# Patient Record
Sex: Female | Born: 1935 | Race: White | Hispanic: No | Marital: Single | Smoking: Never smoker
Health system: Southern US, Community
[De-identification: ages and names within clinical notes are randomized; demographics above are authoritative.]

## PROBLEM LIST (undated history)

## (undated) DIAGNOSIS — I10 Essential (primary) hypertension: Secondary | ICD-10-CM

---

## 1997-07-30 ENCOUNTER — Other Ambulatory Visit: Admission: RE | Admit: 1997-07-30 | Discharge: 1997-07-30 | Payer: Self-pay | Admitting: Obstetrics and Gynecology

## 1998-09-09 ENCOUNTER — Other Ambulatory Visit: Admission: RE | Admit: 1998-09-09 | Discharge: 1998-09-09 | Payer: Self-pay | Admitting: Obstetrics and Gynecology

## 1999-09-11 ENCOUNTER — Other Ambulatory Visit: Admission: RE | Admit: 1999-09-11 | Discharge: 1999-09-11 | Payer: Self-pay | Admitting: Obstetrics and Gynecology

## 2000-09-10 ENCOUNTER — Other Ambulatory Visit: Admission: RE | Admit: 2000-09-10 | Discharge: 2000-09-10 | Payer: Self-pay | Admitting: Obstetrics and Gynecology

## 2001-07-04 ENCOUNTER — Ambulatory Visit (HOSPITAL_COMMUNITY): Admission: RE | Admit: 2001-07-04 | Discharge: 2001-07-04 | Payer: Self-pay | Admitting: Oncology

## 2001-07-04 ENCOUNTER — Encounter (HOSPITAL_COMMUNITY): Payer: Self-pay | Admitting: Oncology

## 2001-09-15 ENCOUNTER — Other Ambulatory Visit: Admission: RE | Admit: 2001-09-15 | Discharge: 2001-09-15 | Payer: Self-pay | Admitting: Obstetrics and Gynecology

## 2003-10-04 ENCOUNTER — Other Ambulatory Visit: Admission: RE | Admit: 2003-10-04 | Discharge: 2003-10-04 | Payer: Self-pay | Admitting: Obstetrics and Gynecology

## 2003-11-09 ENCOUNTER — Encounter: Admission: RE | Admit: 2003-11-09 | Discharge: 2003-12-13 | Payer: Self-pay | Admitting: Internal Medicine

## 2004-07-21 ENCOUNTER — Ambulatory Visit: Payer: Self-pay | Admitting: Oncology

## 2004-07-24 ENCOUNTER — Ambulatory Visit (HOSPITAL_COMMUNITY): Admission: RE | Admit: 2004-07-24 | Discharge: 2004-07-24 | Payer: Self-pay | Admitting: Oncology

## 2005-07-20 ENCOUNTER — Ambulatory Visit: Payer: Self-pay | Admitting: Oncology

## 2005-07-21 LAB — CBC WITH DIFFERENTIAL/PLATELET
BASO%: 0.6 % (ref 0.0–2.0)
Basophils Absolute: 0 10*3/uL (ref 0.0–0.1)
EOS%: 3.7 % (ref 0.0–7.0)
Eosinophils Absolute: 0.2 10*3/uL (ref 0.0–0.5)
HCT: 37.1 % (ref 34.8–46.6)
HGB: 12.7 g/dL (ref 11.6–15.9)
LYMPH%: 26 % (ref 14.0–48.0)
MCH: 31.2 pg (ref 26.0–34.0)
MCHC: 34.2 g/dL (ref 32.0–36.0)
MCV: 91.3 fL (ref 81.0–101.0)
MONO#: 1 10*3/uL — ABNORMAL HIGH (ref 0.1–0.9)
MONO%: 15.1 % — ABNORMAL HIGH (ref 0.0–13.0)
NEUT#: 3.4 10*3/uL (ref 1.5–6.5)
NEUT%: 54.6 % (ref 39.6–76.8)
Platelets: 368 10*3/uL (ref 145–400)
RBC: 4.06 10*6/uL (ref 3.70–5.32)
RDW: 12.2 % (ref 11.3–14.5)
WBC: 6.3 10*3/uL (ref 3.9–10.0)
lymph#: 1.6 10*3/uL (ref 0.9–3.3)

## 2005-07-21 LAB — COMPREHENSIVE METABOLIC PANEL
ALT: 17 U/L (ref 0–40)
AST: 22 U/L (ref 0–37)
Albumin: 4 g/dL (ref 3.5–5.2)
Alkaline Phosphatase: 50 U/L (ref 39–117)
BUN: 26 mg/dL — ABNORMAL HIGH (ref 6–23)
CO2: 26 mEq/L (ref 19–32)
Calcium: 9.7 mg/dL (ref 8.4–10.5)
Chloride: 101 mEq/L (ref 96–112)
Creatinine, Ser: 0.9 mg/dL (ref 0.4–1.2)
Glucose, Bld: 91 mg/dL (ref 70–99)
Potassium: 4.9 mEq/L (ref 3.5–5.3)
Sodium: 136 mEq/L (ref 135–145)
Total Bilirubin: 0.4 mg/dL (ref 0.3–1.2)
Total Protein: 6.8 g/dL (ref 6.0–8.3)

## 2005-07-21 LAB — LACTATE DEHYDROGENASE: LDH: 139 U/L (ref 94–250)

## 2006-07-16 ENCOUNTER — Ambulatory Visit: Payer: Self-pay | Admitting: Oncology

## 2006-07-20 LAB — CBC WITH DIFFERENTIAL/PLATELET
BASO%: 0.5 % (ref 0.0–2.0)
Basophils Absolute: 0 10*3/uL (ref 0.0–0.1)
EOS%: 4.5 % (ref 0.0–7.0)
Eosinophils Absolute: 0.3 10*3/uL (ref 0.0–0.5)
HCT: 38.4 % (ref 34.8–46.6)
HGB: 13.6 g/dL (ref 11.6–15.9)
LYMPH%: 24.1 % (ref 14.0–48.0)
MCH: 32.8 pg (ref 26.0–34.0)
MCHC: 35.5 g/dL (ref 32.0–36.0)
MCV: 92.3 fL (ref 81.0–101.0)
MONO#: 0.7 10*3/uL (ref 0.1–0.9)
MONO%: 11.1 % (ref 0.0–13.0)
NEUT#: 4 10*3/uL (ref 1.5–6.5)
NEUT%: 59.8 % (ref 39.6–76.8)
Platelets: 306 10*3/uL (ref 145–400)
RBC: 4.16 10*6/uL (ref 3.70–5.32)
RDW: 12.7 % (ref 11.3–14.5)
WBC: 6.7 10*3/uL (ref 3.9–10.0)
lymph#: 1.6 10*3/uL (ref 0.9–3.3)

## 2006-07-20 LAB — COMPREHENSIVE METABOLIC PANEL
ALT: 14 U/L (ref 0–35)
BUN: 19 mg/dL (ref 6–23)
CO2: 29 mEq/L (ref 19–32)
Creatinine, Ser: 0.87 mg/dL (ref 0.40–1.20)
Total Bilirubin: 0.5 mg/dL (ref 0.3–1.2)

## 2006-07-20 LAB — LACTATE DEHYDROGENASE: LDH: 142 U/L (ref 94–250)

## 2015-01-25 DIAGNOSIS — E119 Type 2 diabetes mellitus without complications: Secondary | ICD-10-CM | POA: Diagnosis not present

## 2015-01-25 DIAGNOSIS — N390 Urinary tract infection, site not specified: Secondary | ICD-10-CM | POA: Diagnosis not present

## 2015-01-25 DIAGNOSIS — I10 Essential (primary) hypertension: Secondary | ICD-10-CM | POA: Diagnosis not present

## 2015-01-25 DIAGNOSIS — E782 Mixed hyperlipidemia: Secondary | ICD-10-CM | POA: Diagnosis not present

## 2015-02-01 DIAGNOSIS — E119 Type 2 diabetes mellitus without complications: Secondary | ICD-10-CM | POA: Diagnosis not present

## 2015-02-01 DIAGNOSIS — M810 Age-related osteoporosis without current pathological fracture: Secondary | ICD-10-CM | POA: Diagnosis not present

## 2015-02-01 DIAGNOSIS — I10 Essential (primary) hypertension: Secondary | ICD-10-CM | POA: Diagnosis not present

## 2015-02-01 DIAGNOSIS — Z23 Encounter for immunization: Secondary | ICD-10-CM | POA: Diagnosis not present

## 2015-02-01 DIAGNOSIS — Z0000 Encounter for general adult medical examination without abnormal findings: Secondary | ICD-10-CM | POA: Diagnosis not present

## 2015-02-01 DIAGNOSIS — E782 Mixed hyperlipidemia: Secondary | ICD-10-CM | POA: Diagnosis not present

## 2015-08-30 DIAGNOSIS — I10 Essential (primary) hypertension: Secondary | ICD-10-CM | POA: Diagnosis not present

## 2015-08-30 DIAGNOSIS — E119 Type 2 diabetes mellitus without complications: Secondary | ICD-10-CM | POA: Diagnosis not present

## 2015-08-30 DIAGNOSIS — E782 Mixed hyperlipidemia: Secondary | ICD-10-CM | POA: Diagnosis not present

## 2015-09-13 DIAGNOSIS — E119 Type 2 diabetes mellitus without complications: Secondary | ICD-10-CM | POA: Diagnosis not present

## 2015-09-13 DIAGNOSIS — Z23 Encounter for immunization: Secondary | ICD-10-CM | POA: Diagnosis not present

## 2015-09-13 DIAGNOSIS — I10 Essential (primary) hypertension: Secondary | ICD-10-CM | POA: Diagnosis not present

## 2015-09-13 DIAGNOSIS — E782 Mixed hyperlipidemia: Secondary | ICD-10-CM | POA: Diagnosis not present

## 2016-02-12 DIAGNOSIS — Z23 Encounter for immunization: Secondary | ICD-10-CM | POA: Diagnosis not present

## 2016-09-04 DIAGNOSIS — I10 Essential (primary) hypertension: Secondary | ICD-10-CM | POA: Diagnosis not present

## 2016-09-04 DIAGNOSIS — E119 Type 2 diabetes mellitus without complications: Secondary | ICD-10-CM | POA: Diagnosis not present

## 2016-09-04 DIAGNOSIS — E782 Mixed hyperlipidemia: Secondary | ICD-10-CM | POA: Diagnosis not present

## 2016-09-11 DIAGNOSIS — Z0000 Encounter for general adult medical examination without abnormal findings: Secondary | ICD-10-CM | POA: Diagnosis not present

## 2016-09-11 DIAGNOSIS — Z23 Encounter for immunization: Secondary | ICD-10-CM | POA: Diagnosis not present

## 2016-09-11 DIAGNOSIS — I129 Hypertensive chronic kidney disease with stage 1 through stage 4 chronic kidney disease, or unspecified chronic kidney disease: Secondary | ICD-10-CM | POA: Diagnosis not present

## 2016-09-11 DIAGNOSIS — E119 Type 2 diabetes mellitus without complications: Secondary | ICD-10-CM | POA: Diagnosis not present

## 2016-09-11 DIAGNOSIS — N183 Chronic kidney disease, stage 3 (moderate): Secondary | ICD-10-CM | POA: Diagnosis not present

## 2016-12-29 DIAGNOSIS — R69 Illness, unspecified: Secondary | ICD-10-CM | POA: Diagnosis not present

## 2017-11-01 DIAGNOSIS — E119 Type 2 diabetes mellitus without complications: Secondary | ICD-10-CM | POA: Diagnosis not present

## 2017-11-01 DIAGNOSIS — N183 Chronic kidney disease, stage 3 (moderate): Secondary | ICD-10-CM | POA: Diagnosis not present

## 2017-11-01 DIAGNOSIS — N390 Urinary tract infection, site not specified: Secondary | ICD-10-CM | POA: Diagnosis not present

## 2017-11-08 DIAGNOSIS — I10 Essential (primary) hypertension: Secondary | ICD-10-CM | POA: Diagnosis not present

## 2017-11-08 DIAGNOSIS — Z0000 Encounter for general adult medical examination without abnormal findings: Secondary | ICD-10-CM | POA: Diagnosis not present

## 2017-11-08 DIAGNOSIS — M810 Age-related osteoporosis without current pathological fracture: Secondary | ICD-10-CM | POA: Diagnosis not present

## 2017-11-08 DIAGNOSIS — N183 Chronic kidney disease, stage 3 (moderate): Secondary | ICD-10-CM | POA: Diagnosis not present

## 2017-11-08 DIAGNOSIS — I129 Hypertensive chronic kidney disease with stage 1 through stage 4 chronic kidney disease, or unspecified chronic kidney disease: Secondary | ICD-10-CM | POA: Diagnosis not present

## 2017-11-08 DIAGNOSIS — E782 Mixed hyperlipidemia: Secondary | ICD-10-CM | POA: Diagnosis not present

## 2017-11-08 DIAGNOSIS — E119 Type 2 diabetes mellitus without complications: Secondary | ICD-10-CM | POA: Diagnosis not present

## 2017-11-08 DIAGNOSIS — E6609 Other obesity due to excess calories: Secondary | ICD-10-CM | POA: Diagnosis not present

## 2018-01-15 DIAGNOSIS — R69 Illness, unspecified: Secondary | ICD-10-CM | POA: Diagnosis not present

## 2018-11-18 DIAGNOSIS — E119 Type 2 diabetes mellitus without complications: Secondary | ICD-10-CM | POA: Diagnosis not present

## 2018-11-18 DIAGNOSIS — E782 Mixed hyperlipidemia: Secondary | ICD-10-CM | POA: Diagnosis not present

## 2018-11-18 DIAGNOSIS — I10 Essential (primary) hypertension: Secondary | ICD-10-CM | POA: Diagnosis not present

## 2018-11-25 DIAGNOSIS — E6609 Other obesity due to excess calories: Secondary | ICD-10-CM | POA: Diagnosis not present

## 2018-11-25 DIAGNOSIS — I129 Hypertensive chronic kidney disease with stage 1 through stage 4 chronic kidney disease, or unspecified chronic kidney disease: Secondary | ICD-10-CM | POA: Diagnosis not present

## 2018-11-25 DIAGNOSIS — E119 Type 2 diabetes mellitus without complications: Secondary | ICD-10-CM | POA: Diagnosis not present

## 2018-11-25 DIAGNOSIS — E782 Mixed hyperlipidemia: Secondary | ICD-10-CM | POA: Diagnosis not present

## 2018-11-25 DIAGNOSIS — I10 Essential (primary) hypertension: Secondary | ICD-10-CM | POA: Diagnosis not present

## 2018-11-25 DIAGNOSIS — M720 Palmar fascial fibromatosis [Dupuytren]: Secondary | ICD-10-CM | POA: Diagnosis not present

## 2018-11-25 DIAGNOSIS — M810 Age-related osteoporosis without current pathological fracture: Secondary | ICD-10-CM | POA: Diagnosis not present

## 2018-11-25 DIAGNOSIS — Z0000 Encounter for general adult medical examination without abnormal findings: Secondary | ICD-10-CM | POA: Diagnosis not present

## 2018-11-25 DIAGNOSIS — Z7189 Other specified counseling: Secondary | ICD-10-CM | POA: Diagnosis not present

## 2018-11-25 DIAGNOSIS — N183 Chronic kidney disease, stage 3 (moderate): Secondary | ICD-10-CM | POA: Diagnosis not present

## 2018-12-09 DIAGNOSIS — E119 Type 2 diabetes mellitus without complications: Secondary | ICD-10-CM | POA: Diagnosis not present

## 2018-12-09 DIAGNOSIS — I739 Peripheral vascular disease, unspecified: Secondary | ICD-10-CM | POA: Diagnosis not present

## 2018-12-09 DIAGNOSIS — L602 Onychogryphosis: Secondary | ICD-10-CM | POA: Diagnosis not present

## 2018-12-09 DIAGNOSIS — R2689 Other abnormalities of gait and mobility: Secondary | ICD-10-CM | POA: Diagnosis not present

## 2018-12-09 DIAGNOSIS — M6281 Muscle weakness (generalized): Secondary | ICD-10-CM | POA: Diagnosis not present

## 2018-12-15 DIAGNOSIS — M720 Palmar fascial fibromatosis [Dupuytren]: Secondary | ICD-10-CM | POA: Diagnosis not present

## 2019-02-06 ENCOUNTER — Encounter

## 2019-02-07 ENCOUNTER — Ambulatory Visit: Payer: Self-pay | Admitting: Podiatry

## 2019-07-13 DIAGNOSIS — L602 Onychogryphosis: Secondary | ICD-10-CM | POA: Diagnosis not present

## 2019-07-13 DIAGNOSIS — I739 Peripheral vascular disease, unspecified: Secondary | ICD-10-CM | POA: Diagnosis not present

## 2019-11-16 DIAGNOSIS — E119 Type 2 diabetes mellitus without complications: Secondary | ICD-10-CM | POA: Diagnosis not present

## 2019-11-16 DIAGNOSIS — L602 Onychogryphosis: Secondary | ICD-10-CM | POA: Diagnosis not present

## 2019-11-16 DIAGNOSIS — L84 Corns and callosities: Secondary | ICD-10-CM | POA: Diagnosis not present

## 2019-11-24 DIAGNOSIS — I10 Essential (primary) hypertension: Secondary | ICD-10-CM | POA: Diagnosis not present

## 2019-11-24 DIAGNOSIS — E119 Type 2 diabetes mellitus without complications: Secondary | ICD-10-CM | POA: Diagnosis not present

## 2019-11-24 DIAGNOSIS — Z0000 Encounter for general adult medical examination without abnormal findings: Secondary | ICD-10-CM | POA: Diagnosis not present

## 2019-11-29 DIAGNOSIS — N390 Urinary tract infection, site not specified: Secondary | ICD-10-CM | POA: Diagnosis not present

## 2019-11-29 DIAGNOSIS — E1165 Type 2 diabetes mellitus with hyperglycemia: Secondary | ICD-10-CM | POA: Diagnosis not present

## 2019-12-29 DIAGNOSIS — E119 Type 2 diabetes mellitus without complications: Secondary | ICD-10-CM | POA: Diagnosis not present

## 2019-12-29 DIAGNOSIS — M810 Age-related osteoporosis without current pathological fracture: Secondary | ICD-10-CM | POA: Diagnosis not present

## 2019-12-29 DIAGNOSIS — E1122 Type 2 diabetes mellitus with diabetic chronic kidney disease: Secondary | ICD-10-CM | POA: Diagnosis not present

## 2019-12-29 DIAGNOSIS — E6609 Other obesity due to excess calories: Secondary | ICD-10-CM | POA: Diagnosis not present

## 2019-12-29 DIAGNOSIS — E782 Mixed hyperlipidemia: Secondary | ICD-10-CM | POA: Diagnosis not present

## 2019-12-29 DIAGNOSIS — Z0000 Encounter for general adult medical examination without abnormal findings: Secondary | ICD-10-CM | POA: Diagnosis not present

## 2019-12-29 DIAGNOSIS — N1831 Chronic kidney disease, stage 3a: Secondary | ICD-10-CM | POA: Diagnosis not present

## 2019-12-29 DIAGNOSIS — N1832 Chronic kidney disease, stage 3b: Secondary | ICD-10-CM | POA: Diagnosis not present

## 2019-12-29 DIAGNOSIS — I129 Hypertensive chronic kidney disease with stage 1 through stage 4 chronic kidney disease, or unspecified chronic kidney disease: Secondary | ICD-10-CM | POA: Diagnosis not present

## 2019-12-29 DIAGNOSIS — I10 Essential (primary) hypertension: Secondary | ICD-10-CM | POA: Diagnosis not present

## 2020-02-13 DIAGNOSIS — L603 Nail dystrophy: Secondary | ICD-10-CM | POA: Diagnosis not present

## 2020-02-13 DIAGNOSIS — L84 Corns and callosities: Secondary | ICD-10-CM | POA: Diagnosis not present

## 2020-02-13 DIAGNOSIS — I739 Peripheral vascular disease, unspecified: Secondary | ICD-10-CM | POA: Diagnosis not present

## 2020-02-13 DIAGNOSIS — B351 Tinea unguium: Secondary | ICD-10-CM | POA: Diagnosis not present

## 2020-08-07 DIAGNOSIS — I739 Peripheral vascular disease, unspecified: Secondary | ICD-10-CM | POA: Diagnosis not present

## 2020-08-07 DIAGNOSIS — E119 Type 2 diabetes mellitus without complications: Secondary | ICD-10-CM | POA: Diagnosis not present

## 2020-08-07 DIAGNOSIS — L602 Onychogryphosis: Secondary | ICD-10-CM | POA: Diagnosis not present

## 2020-11-19 ENCOUNTER — Emergency Department (HOSPITAL_COMMUNITY): Payer: Medicare HMO

## 2020-11-19 ENCOUNTER — Emergency Department (HOSPITAL_COMMUNITY): Payer: Medicare HMO | Admitting: Certified Registered Nurse Anesthetist

## 2020-11-19 ENCOUNTER — Encounter (HOSPITAL_COMMUNITY): Payer: Self-pay

## 2020-11-19 ENCOUNTER — Encounter (HOSPITAL_COMMUNITY)
Admission: EM | Disposition: A | Discharge status: Rehabilitation Facility | Payer: Self-pay | Source: Home / Self Care | Attending: Neurology

## 2020-11-19 ENCOUNTER — Inpatient Hospital Stay (HOSPITAL_COMMUNITY): Payer: Medicare HMO

## 2020-11-19 ENCOUNTER — Inpatient Hospital Stay (HOSPITAL_COMMUNITY)
Admission: EM | Admit: 2020-11-19 | Discharge: 2020-11-25 | DRG: 024 | Disposition: A | Discharge status: Rehabilitation Facility | Payer: Medicare HMO | Attending: Neurology | Admitting: Neurology

## 2020-11-19 DIAGNOSIS — N183 Chronic kidney disease, stage 3 unspecified: Secondary | ICD-10-CM | POA: Diagnosis present

## 2020-11-19 DIAGNOSIS — I63512 Cerebral infarction due to unspecified occlusion or stenosis of left middle cerebral artery: Secondary | ICD-10-CM | POA: Diagnosis not present

## 2020-11-19 DIAGNOSIS — Z9889 Other specified postprocedural states: Secondary | ICD-10-CM | POA: Diagnosis not present

## 2020-11-19 DIAGNOSIS — Z9010 Acquired absence of unspecified breast and nipple: Secondary | ICD-10-CM | POA: Diagnosis not present

## 2020-11-19 DIAGNOSIS — Z853 Personal history of malignant neoplasm of breast: Secondary | ICD-10-CM

## 2020-11-19 DIAGNOSIS — I700 Atherosclerosis of aorta: Secondary | ICD-10-CM | POA: Diagnosis not present

## 2020-11-19 DIAGNOSIS — I639 Cerebral infarction, unspecified: Secondary | ICD-10-CM | POA: Diagnosis not present

## 2020-11-19 DIAGNOSIS — N179 Acute kidney failure, unspecified: Secondary | ICD-10-CM | POA: Diagnosis present

## 2020-11-19 DIAGNOSIS — Z79890 Hormone replacement therapy (postmenopausal): Secondary | ICD-10-CM

## 2020-11-19 DIAGNOSIS — E1122 Type 2 diabetes mellitus with diabetic chronic kidney disease: Secondary | ICD-10-CM | POA: Diagnosis present

## 2020-11-19 DIAGNOSIS — E871 Hypo-osmolality and hyponatremia: Secondary | ICD-10-CM | POA: Diagnosis not present

## 2020-11-19 DIAGNOSIS — R32 Unspecified urinary incontinence: Secondary | ICD-10-CM | POA: Diagnosis present

## 2020-11-19 DIAGNOSIS — I4510 Unspecified right bundle-branch block: Secondary | ICD-10-CM | POA: Diagnosis present

## 2020-11-19 DIAGNOSIS — Z79899 Other long term (current) drug therapy: Secondary | ICD-10-CM | POA: Diagnosis not present

## 2020-11-19 DIAGNOSIS — M419 Scoliosis, unspecified: Secondary | ICD-10-CM | POA: Diagnosis not present

## 2020-11-19 DIAGNOSIS — I6523 Occlusion and stenosis of bilateral carotid arteries: Secondary | ICD-10-CM | POA: Diagnosis not present

## 2020-11-19 DIAGNOSIS — G319 Degenerative disease of nervous system, unspecified: Secondary | ICD-10-CM | POA: Diagnosis not present

## 2020-11-19 DIAGNOSIS — I63412 Cerebral infarction due to embolism of left middle cerebral artery: Secondary | ICD-10-CM | POA: Diagnosis not present

## 2020-11-19 DIAGNOSIS — I129 Hypertensive chronic kidney disease with stage 1 through stage 4 chronic kidney disease, or unspecified chronic kidney disease: Secondary | ICD-10-CM | POA: Diagnosis present

## 2020-11-19 DIAGNOSIS — E876 Hypokalemia: Secondary | ICD-10-CM | POA: Diagnosis present

## 2020-11-19 DIAGNOSIS — R471 Dysarthria and anarthria: Secondary | ICD-10-CM | POA: Diagnosis present

## 2020-11-19 DIAGNOSIS — Z20822 Contact with and (suspected) exposure to covid-19: Secondary | ICD-10-CM | POA: Diagnosis present

## 2020-11-19 DIAGNOSIS — R29706 NIHSS score 6: Secondary | ICD-10-CM | POA: Diagnosis present

## 2020-11-19 DIAGNOSIS — E785 Hyperlipidemia, unspecified: Secondary | ICD-10-CM | POA: Diagnosis present

## 2020-11-19 DIAGNOSIS — I10 Essential (primary) hypertension: Secondary | ICD-10-CM | POA: Diagnosis not present

## 2020-11-19 DIAGNOSIS — H9190 Unspecified hearing loss, unspecified ear: Secondary | ICD-10-CM | POA: Diagnosis present

## 2020-11-19 DIAGNOSIS — T17908A Unspecified foreign body in respiratory tract, part unspecified causing other injury, initial encounter: Secondary | ICD-10-CM

## 2020-11-19 DIAGNOSIS — D72829 Elevated white blood cell count, unspecified: Secondary | ICD-10-CM | POA: Diagnosis not present

## 2020-11-19 DIAGNOSIS — E1151 Type 2 diabetes mellitus with diabetic peripheral angiopathy without gangrene: Secondary | ICD-10-CM | POA: Diagnosis present

## 2020-11-19 DIAGNOSIS — E7800 Pure hypercholesterolemia, unspecified: Secondary | ICD-10-CM | POA: Diagnosis not present

## 2020-11-19 DIAGNOSIS — G9389 Other specified disorders of brain: Secondary | ICD-10-CM | POA: Diagnosis not present

## 2020-11-19 DIAGNOSIS — Z8249 Family history of ischemic heart disease and other diseases of the circulatory system: Secondary | ICD-10-CM

## 2020-11-19 DIAGNOSIS — Z833 Family history of diabetes mellitus: Secondary | ICD-10-CM | POA: Diagnosis not present

## 2020-11-19 DIAGNOSIS — R404 Transient alteration of awareness: Secondary | ICD-10-CM | POA: Diagnosis not present

## 2020-11-19 DIAGNOSIS — R4701 Aphasia: Secondary | ICD-10-CM | POA: Diagnosis not present

## 2020-11-19 DIAGNOSIS — L24A0 Irritant contact dermatitis due to friction or contact with body fluids, unspecified: Secondary | ICD-10-CM | POA: Diagnosis present

## 2020-11-19 DIAGNOSIS — D631 Anemia in chronic kidney disease: Secondary | ICD-10-CM | POA: Diagnosis present

## 2020-11-19 DIAGNOSIS — Z743 Need for continuous supervision: Secondary | ICD-10-CM | POA: Diagnosis not present

## 2020-11-19 DIAGNOSIS — L8990 Pressure ulcer of unspecified site, unspecified stage: Secondary | ICD-10-CM | POA: Insufficient documentation

## 2020-11-19 DIAGNOSIS — E039 Hypothyroidism, unspecified: Secondary | ICD-10-CM | POA: Diagnosis not present

## 2020-11-19 DIAGNOSIS — I70202 Unspecified atherosclerosis of native arteries of extremities, left leg: Secondary | ICD-10-CM | POA: Diagnosis present

## 2020-11-19 DIAGNOSIS — Z23 Encounter for immunization: Secondary | ICD-10-CM | POA: Diagnosis present

## 2020-11-19 HISTORY — PX: IR CT HEAD LTD: IMG2386

## 2020-11-19 HISTORY — DX: Essential (primary) hypertension: I10

## 2020-11-19 HISTORY — PX: IR PERCUTANEOUS ART THROMBECTOMY/INFUSION INTRACRANIAL INC DIAG ANGIO: IMG6087

## 2020-11-19 HISTORY — PX: IR US GUIDE VASC ACCESS LEFT: IMG2389

## 2020-11-19 HISTORY — PX: IR INTRA CRAN STENT: IMG2345

## 2020-11-19 HISTORY — PX: RADIOLOGY WITH ANESTHESIA: SHX6223

## 2020-11-19 LAB — CBC
HCT: 36.7 % (ref 36.0–46.0)
Hemoglobin: 12.5 g/dL (ref 12.0–15.0)
MCH: 32.3 pg (ref 26.0–34.0)
MCHC: 34.1 g/dL (ref 30.0–36.0)
MCV: 94.8 fL (ref 80.0–100.0)
Platelets: 227 10*3/uL (ref 150–400)
RBC: 3.87 MIL/uL (ref 3.87–5.11)
RDW: 12.6 % (ref 11.5–15.5)
WBC: 6.9 10*3/uL (ref 4.0–10.5)
nRBC: 0 % (ref 0.0–0.2)

## 2020-11-19 LAB — URINALYSIS, ROUTINE W REFLEX MICROSCOPIC
Bilirubin Urine: NEGATIVE
Glucose, UA: NEGATIVE mg/dL
Ketones, ur: NEGATIVE mg/dL
Leukocytes,Ua: NEGATIVE
Nitrite: NEGATIVE
Protein, ur: NEGATIVE mg/dL
Specific Gravity, Urine: <1.005 — ABNORMAL LOW (ref 1.005–1.030)
pH: 5.5 (ref 5.0–8.0)

## 2020-11-19 LAB — RAPID URINE DRUG SCREEN, HOSP PERFORMED
Amphetamines: NOT DETECTED
Barbiturates: NOT DETECTED
Benzodiazepines: NOT DETECTED
Cocaine: NOT DETECTED
Opiates: NOT DETECTED
Tetrahydrocannabinol: NOT DETECTED

## 2020-11-19 LAB — COMPREHENSIVE METABOLIC PANEL
ALT: 13 U/L (ref 0–44)
AST: 20 U/L (ref 15–41)
Albumin: 3.4 g/dL — ABNORMAL LOW (ref 3.5–5.0)
Alkaline Phosphatase: 41 U/L (ref 38–126)
Anion gap: 8 (ref 5–15)
BUN: 20 mg/dL (ref 8–23)
CO2: 28 mmol/L (ref 22–32)
Calcium: 9.4 mg/dL (ref 8.9–10.3)
Chloride: 99 mmol/L (ref 98–111)
Creatinine, Ser: 1.13 mg/dL — ABNORMAL HIGH (ref 0.44–1.00)
GFR, Estimated: 47 mL/min — ABNORMAL LOW (ref >60)
Glucose, Bld: 105 mg/dL — ABNORMAL HIGH (ref 70–99)
Potassium: 3.6 mmol/L (ref 3.5–5.1)
Sodium: 135 mmol/L (ref 135–145)
Total Bilirubin: 0.7 mg/dL (ref 0.3–1.2)
Total Protein: 6.6 g/dL (ref 6.5–8.1)

## 2020-11-19 LAB — GLUCOSE, CAPILLARY
Glucose-Capillary: 81 mg/dL (ref 70–99)
Glucose-Capillary: 87 mg/dL (ref 70–99)

## 2020-11-19 LAB — I-STAT CHEM 8, ED
BUN: 21 mg/dL (ref 8–23)
Calcium, Ion: 1.2 mmol/L (ref 1.15–1.40)
Chloride: 99 mmol/L (ref 98–111)
Creatinine, Ser: 1.1 mg/dL — ABNORMAL HIGH (ref 0.44–1.00)
Glucose, Bld: 102 mg/dL — ABNORMAL HIGH (ref 70–99)
HCT: 36 % (ref 36.0–46.0)
Hemoglobin: 12.2 g/dL (ref 12.0–15.0)
Potassium: 3.6 mmol/L (ref 3.5–5.1)
Sodium: 137 mmol/L (ref 135–145)
TCO2: 29 mmol/L (ref 22–32)

## 2020-11-19 LAB — DIFFERENTIAL
Abs Immature Granulocytes: 0.03 10*3/uL (ref 0.00–0.07)
Basophils Absolute: 0.1 10*3/uL (ref 0.0–0.1)
Basophils Relative: 1 %
Eosinophils Absolute: 0.2 10*3/uL (ref 0.0–0.5)
Eosinophils Relative: 3 %
Immature Granulocytes: 0 %
Lymphocytes Relative: 14 %
Lymphs Abs: 1 10*3/uL (ref 0.7–4.0)
Monocytes Absolute: 0.5 10*3/uL (ref 0.1–1.0)
Monocytes Relative: 7 %
Neutro Abs: 5.2 10*3/uL (ref 1.7–7.7)
Neutrophils Relative %: 75 %

## 2020-11-19 LAB — RESP PANEL BY RT-PCR (FLU A&B, COVID) ARPGX2
Influenza A by PCR: NEGATIVE
Influenza B by PCR: NEGATIVE
SARS Coronavirus 2 by RT PCR: NEGATIVE

## 2020-11-19 LAB — URINALYSIS, MICROSCOPIC (REFLEX): Squamous Epithelial / HPF: NONE SEEN (ref 0–5)

## 2020-11-19 LAB — MRSA NEXT GEN BY PCR, NASAL: MRSA by PCR Next Gen: NOT DETECTED

## 2020-11-19 LAB — PROTIME-INR
INR: 1.1 (ref 0.8–1.2)
Prothrombin Time: 14.7 seconds (ref 11.4–15.2)

## 2020-11-19 LAB — ETHANOL: Alcohol, Ethyl (B): <10 mg/dL (ref <10)

## 2020-11-19 LAB — APTT: aPTT: 27 seconds (ref 24–36)

## 2020-11-19 SURGERY — IR WITH ANESTHESIA
Anesthesia: General

## 2020-11-19 MED ORDER — FENTANYL CITRATE (PF) 250 MCG/5ML IJ SOLN
INTRAMUSCULAR | Status: DC | PRN
  Administered 2020-11-19: 100 ug via INTRAVENOUS

## 2020-11-19 MED ORDER — ACETAMINOPHEN 325 MG PO TABS
650.0000 mg | ORAL_TABLET | ORAL | Status: DC | PRN
  Administered 2020-11-22: 650 mg via ORAL
  Filled 2020-11-19 (×2): qty 2

## 2020-11-19 MED ORDER — SODIUM CHLORIDE 0.9 % IV SOLN
INTRAVENOUS | Status: DC | PRN
Start: 2020-11-19 — End: 2020-11-19

## 2020-11-19 MED ORDER — SODIUM CHLORIDE 0.9 % IV SOLN: INTRAVENOUS | Status: DC

## 2020-11-19 MED ORDER — ROCURONIUM BROMIDE 10 MG/ML (PF) SYRINGE
PREFILLED_SYRINGE | INTRAVENOUS | Status: DC | PRN
  Administered 2020-11-19: 50 mg via INTRAVENOUS

## 2020-11-19 MED ORDER — INSULIN ASPART 100 UNIT/ML IJ SOLN: 0.0000 [IU] | INTRAMUSCULAR | Status: DC

## 2020-11-19 MED ORDER — ACETAMINOPHEN 650 MG RE SUPP: 650.0000 mg | RECTAL | Status: DC | PRN

## 2020-11-19 MED ORDER — SUCCINYLCHOLINE CHLORIDE 200 MG/10ML IV SOSY
PREFILLED_SYRINGE | INTRAVENOUS | Status: DC | PRN
  Administered 2020-11-19: 100 mg via INTRAVENOUS

## 2020-11-19 MED ORDER — FENTANYL CITRATE (PF) 250 MCG/5ML IJ SOLN
INTRAMUSCULAR | Status: AC
  Filled 2020-11-19: qty 5

## 2020-11-19 MED ORDER — ONDANSETRON HCL 4 MG/2ML IJ SOLN
INTRAMUSCULAR | Status: DC | PRN
  Administered 2020-11-19: 4 mg via INTRAVENOUS

## 2020-11-19 MED ORDER — ASPIRIN 81 MG PO CHEW
81.0000 mg | CHEWABLE_TABLET | Freq: Every day | ORAL | Status: DC
  Administered 2020-11-19 – 2020-11-25 (×7): 81 mg via ORAL
  Filled 2020-11-19 (×7): qty 1

## 2020-11-19 MED ORDER — STROKE: EARLY STAGES OF RECOVERY BOOK
Freq: Once | Status: AC
  Administered 2020-11-19: 1
  Filled 2020-11-19: qty 1

## 2020-11-19 MED ORDER — TICAGRELOR 90 MG PO TABS
180.0000 mg | ORAL_TABLET | Freq: Once | ORAL | Status: AC
  Administered 2020-11-19: 180 mg via ORAL
  Filled 2020-11-19: qty 2

## 2020-11-19 MED ORDER — PHENYLEPHRINE 40 MCG/ML (10ML) SYRINGE FOR IV PUSH (FOR BLOOD PRESSURE SUPPORT)
PREFILLED_SYRINGE | INTRAVENOUS | Status: DC | PRN
Start: 2020-11-19 — End: 2020-11-19
  Administered 2020-11-19: 80 ug via INTRAVENOUS
  Administered 2020-11-19: 120 ug via INTRAVENOUS

## 2020-11-19 MED ORDER — CANGRELOR TETRASODIUM 50 MG IV SOLR
INTRAVENOUS | Status: AC
  Filled 2020-11-19: qty 50

## 2020-11-19 MED ORDER — SODIUM CHLORIDE 0.9 % IV SOLN: INTRAVENOUS | Status: DC | PRN

## 2020-11-19 MED ORDER — ACETAMINOPHEN 160 MG/5ML PO SOLN: 650.0000 mg | ORAL | Status: DC | PRN

## 2020-11-19 MED ORDER — TICAGRELOR 90 MG PO TABS
180.0000 mg | ORAL_TABLET | Freq: Once | ORAL | Status: AC
  Filled 2020-11-19: qty 2

## 2020-11-19 MED ORDER — SUGAMMADEX SODIUM 200 MG/2ML IV SOLN
INTRAVENOUS | Status: DC | PRN
  Administered 2020-11-19: 200 mg via INTRAVENOUS

## 2020-11-19 MED ORDER — TICAGRELOR 90 MG PO TABS
90.0000 mg | ORAL_TABLET | Freq: Two times a day (BID) | ORAL | Status: DC
  Filled 2020-11-19: qty 1

## 2020-11-19 MED ORDER — SENNOSIDES-DOCUSATE SODIUM 8.6-50 MG PO TABS
1.0000 | ORAL_TABLET | Freq: Every evening | ORAL | Status: DC | PRN
Start: 2020-11-19 — End: 2020-11-25

## 2020-11-19 MED ORDER — LEVOTHYROXINE SODIUM 50 MCG PO TABS
50.0000 ug | ORAL_TABLET | Freq: Every day | ORAL | Status: DC
  Administered 2020-11-20 – 2020-11-25 (×6): 50 ug via ORAL
  Filled 2020-11-19 (×7): qty 1

## 2020-11-19 MED ORDER — TICAGRELOR 90 MG PO TABS
90.0000 mg | ORAL_TABLET | Freq: Two times a day (BID) | ORAL | Status: DC
  Administered 2020-11-20 – 2020-11-25 (×11): 90 mg via ORAL
  Filled 2020-11-19 (×11): qty 1

## 2020-11-19 MED ORDER — IOHEXOL 240 MG/ML SOLN
200.0000 mL | Freq: Once | INTRAMUSCULAR | Status: AC | PRN
  Administered 2020-11-19: 130 mL via INTRA_ARTERIAL

## 2020-11-19 MED ORDER — IOHEXOL 350 MG/ML SOLN
100.0000 mL | Freq: Once | INTRAVENOUS | Status: AC | PRN
  Administered 2020-11-19: 100 mL via INTRAVENOUS

## 2020-11-19 MED ORDER — PHENYLEPHRINE HCL-NACL 20-0.9 MG/250ML-% IV SOLN: INTRAVENOUS | Status: DC | PRN

## 2020-11-19 MED ORDER — CANGRELOR BOLUS VIA INFUSION
INTRAVENOUS | Status: AC | PRN
  Administered 2020-11-19: 1125 ug via INTRAVENOUS

## 2020-11-19 MED ORDER — CLEVIDIPINE BUTYRATE 0.5 MG/ML IV EMUL: 0.0000 mg/h | INTRAVENOUS | Status: DC

## 2020-11-19 MED ORDER — SODIUM CHLORIDE 0.9 % IV SOLN
INTRAVENOUS | Status: AC | PRN
  Administered 2020-11-19: 2 ug/kg/min via INTRAVENOUS

## 2020-11-19 MED ORDER — PHENYLEPHRINE HCL-NACL 20-0.9 MG/250ML-% IV SOLN
0.0000 ug/min | INTRAVENOUS | Status: DC
Start: 2020-11-19 — End: 2020-11-20

## 2020-11-19 MED ORDER — LIDOCAINE 2% (20 MG/ML) 5 ML SYRINGE
INTRAMUSCULAR | Status: DC | PRN
  Administered 2020-11-19: 60 mg via INTRAVENOUS

## 2020-11-19 MED ORDER — ASPIRIN 81 MG PO CHEW
81.0000 mg | CHEWABLE_TABLET | Freq: Every day | ORAL | Status: DC
  Filled 2020-11-19: qty 1

## 2020-11-19 MED ORDER — PHENYLEPHRINE HCL-NACL 20-0.9 MG/250ML-% IV SOLN
INTRAVENOUS | Status: DC | PRN
  Administered 2020-11-19: 35 ug/min via INTRAVENOUS

## 2020-11-19 MED ORDER — EPHEDRINE SULFATE-NACL 50-0.9 MG/10ML-% IV SOSY
PREFILLED_SYRINGE | INTRAVENOUS | Status: DC | PRN
  Administered 2020-11-19: 10 mg via INTRAVENOUS

## 2020-11-19 MED ORDER — PROPOFOL 10 MG/ML IV BOLUS
INTRAVENOUS | Status: DC | PRN
  Administered 2020-11-19: 70 mg via INTRAVENOUS

## 2020-11-19 NOTE — ED Notes (Signed)
Patient transported to CT 

## 2020-11-19 NOTE — ED Notes (Signed)
MD notified of pts symptoms.   

## 2020-11-19 NOTE — Transfer of Care (Signed)
Immediate Anesthesia Transfer of Care Note  Patient: Maria Dyer  Procedure(s) Performed: IR WITH ANESTHESIA  Patient Location: PACU  Anesthesia Type:General  Level of Consciousness: drowsy and responds to stimulation  Airway & Oxygen Therapy: Patient Spontanous Breathing  Post-op Assessment: Report given to RN and Post -op Vital signs reviewed and stable  Post vital signs: Reviewed and stable  Last Vitals:  Vitals Value Taken Time  BP 126/54 11/19/20 1715  Temp 36.1 C 11/19/20 1715  Pulse 75 11/19/20 1718  Resp 28 11/19/20 1719  SpO2 98 % 11/19/20 1718  Vitals shown include unvalidated device data.  Last Pain:  Vitals:   11/19/20 1326  TempSrc: Oral         Complications: No notable events documented.

## 2020-11-19 NOTE — Plan of Care (Signed)
  Problem: Self-Care: Goal: Ability to participate in self-care as condition permits will improve Outcome: Progressing   Problem: Nutrition: Goal: Risk of aspiration will decrease Outcome: Progressing Goal: Dietary intake will improve Outcome: Progressing   Problem: Ischemic Stroke/TIA Tissue Perfusion: Goal: Complications of ischemic stroke/TIA will be minimized Outcome: Progressing   Problem: Clinical Measurements: Goal: Ability to maintain clinical measurements within normal limits will improve Outcome: Progressing Goal: Will remain free from infection Outcome: Progressing Goal: Diagnostic test results will improve Outcome: Progressing Goal: Respiratory complications will improve Outcome: Progressing Goal: Cardiovascular complication will be avoided Outcome: Progressing   Problem: Activity: Goal: Risk for activity intolerance will decrease Outcome: Progressing   Problem: Nutrition: Goal: Adequate nutrition will be maintained Outcome: Progressing   Problem: Coping: Goal: Level of anxiety will decrease Outcome: Progressing   Problem: Elimination: Goal: Will not experience complications related to bowel motility Outcome: Progressing Goal: Will not experience complications related to urinary retention Outcome: Progressing   Problem: Pain Managment: Goal: General experience of comfort will improve Outcome: Progressing   Problem: Safety: Goal: Ability to remain free from injury will improve Outcome: Progressing   Problem: Education: Goal: Knowledge of disease or condition will improve Outcome: Not Progressing Goal: Knowledge of secondary prevention will improve Outcome: Not Progressing Goal: Knowledge of patient specific risk factors addressed and post discharge goals established will improve Outcome: Not Progressing Goal: Individualized Educational Video(s) Outcome: Not Progressing   Problem: Coping: Goal: Will verbalize positive feelings about  self Outcome: Not Progressing Goal: Will identify appropriate support needs Outcome: Not Progressing   Problem: Health Behavior/Discharge Planning: Goal: Ability to manage health-related needs will improve Outcome: Not Progressing   Problem: Self-Care: Goal: Verbalization of feelings and concerns over difficulty with self-care will improve Outcome: Not Progressing Goal: Ability to communicate needs accurately will improve Outcome: Not Progressing   Problem: Education: Goal: Knowledge of General Education information will improve Description: Including pain rating scale, medication(s)/side effects and non-pharmacologic comfort measures Outcome: Not Progressing   Problem: Health Behavior/Discharge Planning: Goal: Ability to manage health-related needs will improve Outcome: Not Progressing   Problem: Skin Integrity: Goal: Risk for impaired skin integrity will decrease Outcome: Not Progressing

## 2020-11-19 NOTE — Sedation Documentation (Signed)
Pt with large excoriated area to right groin.

## 2020-11-19 NOTE — Anesthesia Postprocedure Evaluation (Signed)
Anesthesia Post Note  Patient: Maria Dyer  Procedure(s) Performed: IR WITH ANESTHESIA     Patient location during evaluation: PACU Anesthesia Type: General Level of consciousness: confused Pain management: pain level controlled Vital Signs Assessment: post-procedure vital signs reviewed and stable Respiratory status: spontaneous breathing, nonlabored ventilation and respiratory function stable Cardiovascular status: blood pressure returned to baseline and stable Postop Assessment: no apparent nausea or vomiting Anesthetic complications: no   No notable events documented.  Last Vitals:  Vitals:   11/19/20 1730 11/19/20 1745  BP: (!) 127/97   Pulse: 75 74  Resp: 16 13  Temp:    SpO2: 100% 100%    Last Pain:  Vitals:   11/19/20 1715  TempSrc:   PainSc: 0-No pain                 [place] Fassnacht,W. EDMOND

## 2020-11-19 NOTE — Anesthesia Preprocedure Evaluation (Signed)
Anesthesia Evaluation  Patient identified by MRN, date of birth, ID band Patient confused    Reviewed: Allergy & Precautions, H&P , NPO status , Patient's Chart, lab work & pertinent test results  Airway Mallampati: II  TM Distance: >3 FB Neck ROM: Full    Dental no notable dental hx. (+) Teeth Intact, Dental Advisory Given   Pulmonary neg pulmonary ROS,    Pulmonary exam normal breath sounds clear to auscultation       Cardiovascular hypertension,  Rhythm:Regular Rate:Tachycardia     Neuro/Psych CVA, Residual Symptoms negative psych ROS   GI/Hepatic negative GI ROS, Neg liver ROS,   Endo/Other  negative endocrine ROS  Renal/GU negative Renal ROS  negative genitourinary   Musculoskeletal   Abdominal   Peds  Hematology negative hematology ROS (+)   Anesthesia Other Findings   Reproductive/Obstetrics negative OB ROS                             Anesthesia Physical Anesthesia Plan  ASA: 3 and emergent  Anesthesia Plan: General   Post-op Pain Management:    Induction: Intravenous, Rapid sequence and Cricoid pressure planned  PONV Risk Score and Plan: 4 or greater and Ondansetron, Dexamethasone and Treatment may vary due to age or medical condition  Airway Management Planned: Oral ETT  Additional Equipment:   Intra-op Plan:   Post-operative Plan: Extubation in OR  Informed Consent: I have reviewed the patients History and Physical, chart, labs and discussed the procedure including the risks, benefits and alternatives for the proposed anesthesia with the patient or authorized representative who has indicated his/her understanding and acceptance.     Dental advisory given  Plan Discussed with: CRNA  Anesthesia Plan Comments:         Anesthesia Quick Evaluation

## 2020-11-19 NOTE — Code Documentation (Signed)
Pt is 87 yr old female with h/o hypertension and hypothyroidism. She was found to be aphasic today by her great-niece at noon. Family saw her well last on Sunday 8/21. Family also messaged with her 8/22 at 14:18, and she seemed normal then. As pt was thought to be out of the window for acute treatment, she was not pre-activated. When pt had CT head that showed L MCA M1 hyperdensity, code stroke was activated. Pt then had a CTA and perfusion. I joined the team in CT at 1505. Both Dr Tommie Sams and Dr Iver Nestle agreed that pt needed to be taken to IR emergently. Pt was prepped and taken to IR. MDs obtaining written consent from pt's family. MDs aware that LKW was > 24 hr ago, but state that as CT had hyperdense L MCA, and ASPECTS 8, and CTA positive for occlusion, and CTP shows core of 5cc and penumbra of 71mL, the stroke is most likely < 24 hr old. Neurologist and interventionalist agree that potential benefit of NIR outweighs risk in this aphasic patient. Informed consent obtained. Pt handed off to IR Alexia Freestone, RN) at [phone].

## 2020-11-19 NOTE — Procedures (Signed)
INTERVENTIONAL NEURORADIOLOGY BRIEF POSTPROCEDURE NOTE  DIAGNOSTIC CEREBRAL ANGIOGRAM MECHANICAL THROMBECTOMY INTRACRANIAL STENTING FLAT PANEL HEAD CT  Attending: Dr. Baldemar Lenis  Assistant: None.  Diagnosis: Left M2/MCA occlusion  Access site: LEFT common femoral artery  Access closure: Perclose Prostyle  Anesthesia: General anesthesia  Medication used: Cangrelor bolus and drip.  Complications: None.  Estimated blood loss: 30-40 mL  Specimen: None  Findings: Left M2/MCA near occlusion with very delayed contrast penetration. Mechanical thrombectomy performed with direct contact aspiration x2 with improved distal perfusion (TICI 2C). However, there was persistent severe stenosis. Patient was loaded on cangrelor and a 4.5 x 30 mm neuroform atlas stent was deployed spanning the left M1-M2 posterio division with significant improvement of the aterograde flow (TICI3). Delay angiogram showed no evidence of in stent thrombosis. Flat panel head CT showed no evidence of hemorrhagic complication.  The patient tolerated the procedure well without incident or complication and is in stable condition.   PLAN: - Bed rest post left femoral access x 6 hours - Head CT around 7:30 pm. It is important that the patient have the CT done before cangrelor drip ends (8pm). - If no bleed on head CT, stop cangrelor and load ticagrelor 180 mg and ASA 81 mg. Please place NG tube if patient is unable to swallow. - If bleed on head CT, please contact neurology and neuro IR. - SBP 120-140 mmHg (24h post procedure)

## 2020-11-19 NOTE — Anesthesia Procedure Notes (Addendum)
Procedure Name: Intubation Date/Time: 11/19/2020 3:24 PM Performed by: Trinna Post., CRNA Pre-anesthesia Checklist: Patient identified, Emergency Drugs available, Suction available, Patient being monitored and Timeout performed Patient Re-evaluated:Patient Re-evaluated prior to induction Oxygen Delivery Method: Circle system utilized Preoxygenation: Pre-oxygenation with 100% oxygen Induction Type: IV induction, Rapid sequence and Cricoid Pressure applied Laryngoscope Size: Mac and 3 Grade View: Grade I Tube type: Oral Tube size: 7.5 mm Number of attempts: 1 Airway Equipment and Method: Stylet Placement Confirmation: ETT inserted through vocal cords under direct vision, positive ETCO2 and breath sounds checked- equal and bilateral Secured at: 22 cm Tube secured with: Tape Dental Injury: Teeth and Oropharynx as per pre-operative assessment

## 2020-11-19 NOTE — H&P (Addendum)
Neurology H&P  Reason for Consult: Aphasia Referring Physician: Dr. Fredderick Phenix  CC: Aphasia  History is obtained from: Dr. Fredderick Phenix, chart review, patient's great niece   HPI: Maria Dyer is a 87 y.o. female with a medical history significant for essential hypertension, severe hearing impairment, breast cancer s/p mastectomy over 25 years ago per great niece, thoracolumbar scoliosis, and hypothyroidism who presented to the ED via EMS from home for evaluation of garbled and dysarthric speech. History is provided by patient's great niece due to patient with significant aphasia. Her great niece went to her house this morning as she does every Tuesday to take Maria Dyer for lunch. When she arrived at Maria Dyer house at noon, Maria Dyer had not gotten out of the bed or gotten dressed which is extremely unusual for her. When she attempted to speak, she was garbled and slurred with very little coherent speech but her great niece states she kept reiterating that she was reading and "all of a sudden". Her great niece also noted that her bed was wet and believes that she was incontinent of urine which is also abnormal. She was last seen normal by family on Sunday at baseline but Maria Dyer daughter reported that she spoke with Maria Dyer via Facebook messaging yesterday at 2:18PM without noted abnormality and her great niece viewed Maria Dyer sitting in her recliner through home video monitoring yesterday as well.   At baseline, Maria Dyer lives independently and is able to ambulate with a walker with reported "very limited mobility" but she is able to navigate around her home without assistance. She manages her own medication and bills and has family nearby who visit often and help with transportation to restaurants and hair appointments. When she is outside of the house, she sometimes prefers to use a wheelchair. She is extremely hard of hearing and family notes that she may say "yes" or nod  inappropriately because she is unable to hear. Her great niece states that Maria Dyer does not need help with ADLs but indicates that she primarily cares for Maria Dyer out of all of her family.   LKW: Last seen by family on Sunday, 11/17/2020. Last spoke to family without noted abnormality via messaging 11/18/2020 at 14:18 tpa given?: no, outside of thrombolytic therapy window IR Thrombectomy? Yes, hyperdense vessel sign at L MCA distal M1, proximal M2 on CTH with small acute LEFT MCA territory infarction, core volume of 44mL with mismatch of 39 mL on CTP.  Modified Rankin Scale: 1-No significant post stroke disability and can perform usual duties with stroke symptoms  ROS: Unable to obtain due to patient with significant aphasia impairing communication.  However per family no recent illnesses, and no other acute concerns or known transient neurological symptoms.  Past Medical History:  Diagnosis Date   Hypertension   Please have pertinent history as detailed above in HPI  No family history on file. Family reports patient's brother had diabetes and that many members of the family have had hypertension  Social History:   has no history on file for tobacco use, alcohol use, and drug use.  Medications  Current Facility-Administered Medications:    fentaNYL citrate (PF) (SUBLIMAZE) 250 MCG/5ML injection, , , ,  No current outpatient medications on file.  Facility-Administered Medications Ordered in Other Encounters:    0.9 %  sodium chloride infusion, , Intravenous, Continuous PRN, Modena Morrow, CRNA, New Bag at 11/19/20 1510   0.9 %  sodium chloride infusion, , Intravenous, Continuous  PRN, Modena Morrow, CRNA, New Bag at 11/19/20 1510   fentaNYL citrate (PF) (SUBLIMAZE) injection, , Intravenous, Anesthesia Intra-op, Modena Morrow, CRNA, 100 mcg at 11/19/20 1522   lidocaine 2% (20 mg/mL) 5 mL syringe, , Intravenous, Anesthesia Intra-op, Modena Morrow, CRNA, 60 mg  at 11/19/20 1522   propofol (DIPRIVAN) 10 mg/mL bolus/IV push, , Intravenous, Anesthesia Intra-op, Modena Morrow, CRNA, 70 mg at 11/19/20 1522   succinylcholine (ANECTINE) syringe, , Intravenous, Anesthesia Intra-op, Modena Morrow, CRNA, 100 mg at 11/19/20 1522  Current Outpatient Medications  Medication Instructions   hydrochlorothiazide (HYDRODIURIL) 25 mg, Oral, Daily   levothyroxine (SYNTHROID) 50 mcg, Oral, Daily   losartan (COZAAR) 100 mg, Oral, Daily   metoprolol succinate (TOPROL-XL) 50 mg, Oral, 2 times daily    Exam: Current vital signs: BP (!) 175/56   Pulse 73   Temp 98.1 F (36.7 C) (Oral)   Resp (!) 21   SpO2 98%  Vital signs in last 24 hours: Temp:  [98.1 F (36.7 C)] 98.1 F (36.7 C) (08/23 1326) Pulse Rate:  [71-73] 73 (08/23 1400) Resp:  [20-21] 21 (08/23 1400) BP: (154-175)/(56-77) 175/56 (08/23 1400) SpO2:  [98 %] 98 % (08/23 1400)  GENERAL: Awake, alert, in no acute distress Psych: Affect appropriate for situation, patient is calm and cooperative with examination Head: Normocephalic and atraumatic, without obvious abnormality EENT: Normal conjunctivae, dry mucous membranes, no OP obstruction LUNGS: Normal respiratory effort. Non-labored breathing on room air CV: Regular rate and rhythm on telemetry ABDOMEN: Soft, non-tender, non-distended Extremities: warm, well perfused, without obvious deformity  Limited neurological evaluation performed consisting of NIH stroke scale to expedite thrombectomy  NIHSS: 1a Level of Conscious.:  0 --alert, regards examiner 1b LOC Questions: 2 --answers yes inappropriately to all questions 1c LOC Commands: 2 --answers yes inappropriately to all questions 2 Best Gaze: 0 --tracks examiner well in all directions although per IR staff perhaps was developing a left gaze preference by the time of her arrival to the IR suite  3 Visual: 0 Orients to wiggling fingers in all quadrants 4 Facial Palsy: 0  5a Motor Arm  - left: 0  5b Motor Arm - Right: 0   6a Motor Leg - Left: 0  6b Motor Leg - Right: 0 7 Limb Ataxia: 0  8 Sensory: 0  9 Best Language: 2  10 Dysarthria: 0  11 Extinct. and Inatten.: 0 TOTAL: 6  Immediately postprocedure: the patient was extubated and still unable to follow commands, or produce any usable speech other than "yes," possibly with a slight left gaze preference but able to cross towards the right.  Possibly reduced blink to threat on the right compared to the left but unreliable.  Moving all 4 extremities grossly equally (localized with bilateral upper extremities to sternal rub, moved bilateral lower extremities to light tickle briskly)   Labs I have reviewed labs in epic and the results pertinent to this consultation are: Glucose 102, creatinine 1.10  CBC    Component Value Date/Time   WBC 6.9 11/19/2020 1401   RBC 3.87 11/19/2020 1401   HGB 12.2 11/19/2020 1422   HGB 13.6 07/20/2006 1021   HCT 36.0 11/19/2020 1422   HCT 38.4 07/20/2006 1021   PLT 227 11/19/2020 1401   PLT 306 07/20/2006 1021   MCV 94.8 11/19/2020 1401   MCV 92.3 07/20/2006 1021   MCH 32.3 11/19/2020 1401   MCHC 34.1 11/19/2020 1401   RDW 12.6 11/19/2020 1401  RDW 12.7 07/20/2006 1021   LYMPHSABS 1.0 11/19/2020 1401   LYMPHSABS 1.6 07/20/2006 1021   MONOABS 0.5 11/19/2020 1401   MONOABS 0.7 07/20/2006 1021   EOSABS 0.2 11/19/2020 1401   EOSABS 0.3 07/20/2006 1021   BASOSABS 0.1 11/19/2020 1401   BASOSABS 0.0 07/20/2006 1021    CMP     Component Value Date/Time   NA 137 11/19/2020 1422   K 3.6 11/19/2020 1422   CL 99 11/19/2020 1422   CO2 28 11/19/2020 1401   GLUCOSE 102 (H) 11/19/2020 1422   BUN 21 11/19/2020 1422   CREATININE 1.10 (H) 11/19/2020 1422   CALCIUM 9.4 11/19/2020 1401   PROT 6.6 11/19/2020 1401   ALBUMIN 3.4 (L) 11/19/2020 1401   AST 20 11/19/2020 1401   ALT 13 11/19/2020 1401   ALKPHOS 41 11/19/2020 1401   BILITOT 0.7 11/19/2020 1401   GFRNONAA 47 (L) 11/19/2020  1401   Lipid Panel  No results found for: CHOL, TRIG, HDL, CHOLHDL, VLDL, LDLCALC, LDLDIRECT  Imaging I have reviewed the images obtained:  CT head, CT angio head and neck, CT cerebral perfusion 11/19/2020: No acute intracranial hemorrhage. Small acute left MCA territory infarction involving inferior insula and adjacent temporal lobe (ASPECT score is 8).   Early bifurcation of the left MCA with distal M1 and proximal M2 occlusion and subsequent partial reconstitution. High-grade stenosis or nonocclusive thrombus within anterior temporal branch approximately 5 mm beyond its origin just proximal to the occlusion.   Perfusion imaging demonstrates 5 mL of core infarction partially corresponding to noncontrast CT abnormality. There is a calculated penumbra of 39 mL in the left MCA territory.   No occlusion or hemodynamically significant stenosis in the neck.  MRI examination of the brain  Assessment: 87 y.o. female with PMHx of hypertension and severe hearing impairment who presented to the ED via EMS for evaluation of aphasia with last seen normal by family on Sunday.  - Examination reveals  - Stroke risk factors include advanced age and history of hypertension.  - CT imaging revealed a small acute left MCA territory infarction with a hyperdense L MCA sign. Angiography imaging revealed a distal M1, proximal M2 occlusion with subsequent partial reconstitution. CT perfusion imaging reveals a 5 mL core infarction and 39 mL penumbra in the left MCA territory.  - Patient is not a candidate for thrombolytic therapy as she is outside of the time window. Due to CT perfusion imaging revealing a small 71mL core and a significant penumbra of 39 mL with a significant deficit with impaired communication, IR thrombectomy procedure with risks and benefits was discussed with patient's family with neurointerventional radiologist and neurologist at bedside. Family consented to procedure and IR was activated  for further intervention.   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, etiology is likely atheroembolic though cardioembolic also on the differential at this time  Recommendations: #Left MCA stroke s/p IA and stent placement - Stroke labs TSH, HgbA1c, fasting lipid panel - MRI brain  - MRA of the brain without contrast  - Frequent neuro checks - Echocardiogram - Dual antiplatelet therapy for stent per neuro IR, will need NG tube to obtain this critical medication if she cannot pass bedside swallow tonight - Risk factor modification - Telemetry monitoring - Blood pressure goal   - Post successful uncomplicated revascularization SBP 120 - 140 for 24 hours, confirmed with neuro interventionalist; Cleviprex and phenylephrine as needed to maintain this blood pressure goals - Goal normoglycemia with low-dose sliding  scale insulin in place  - PT consult, OT consult, Speech consult, unless patient is back to baseline -Discussed with Dr. Otelia LimesLindzen to follow-up repeat head CT at 7:30 PM and confirm safety of oral ticagrelor at that time -Admitted to the stroke team  #Hypertension -Hold home medications for now, blood pressure goal as above  #Hypothyroidism -Continue home Synthroid, follow-up TSH  Pt seen by NP/Neuro and later by MD. Note/plan to be edited by MD as needed.  Lanae BoastStevi Toberman, AGAC-NP [facility] Pager: [phone]  Attending Neurologist's note:  I personally saw this patient, gathering history, performing the neurologic examination, reviewing relevant labs, personally reviewing relevant imaging including head CT, CTA, CTP, and formulated the assessment and plan, adding the note above for completeness and clarity to accurately reflect my thoughts  Brooke DareSrishti Lulla Linville MD-PhD [facility] [phone]  CRITICAL CARE Performed by: Gordy CouncilmanSrishti L Mustapha Colson   Total critical care time: 40 minutes  Critical care time was exclusive of separately  billable procedures and treating other patients.  Critical care was necessary to treat or prevent imminent or life-threatening deterioration.  Critical care was time spent personally by me on the following activities: development of treatment plan with patient and/or surrogate as well as nursing, discussions with consultants, evaluation of patient's response to treatment, examination of patient, obtaining history from patient or surrogate, ordering and performing treatments and interventions, ordering and review of laboratory studies, ordering and review of radiographic studies, pulse oximetry and re-evaluation of patient's condition.

## 2020-11-19 NOTE — Sedation Documentation (Signed)
Left femoral sheath removed. Per close deployed

## 2020-11-19 NOTE — ED Triage Notes (Signed)
Pt comes from home via EMS. Family called out due to receptive and expressive aphasia at this time. Last known well was Sunday afternoon. Pt lives alone denies hx of dementia or strokes. The same family member that saw pt altered today was last to see her normal on Sunday. No unilaterally weakness but unable to follow commands, pt was able to stand and pivot and has purposeful movements bilaterally.  BP 172/86 HR 85 RR 16 O2 98% RA BGL 106

## 2020-11-19 NOTE — Progress Notes (Signed)
246mcg/5ml fentanyl removed from pyxis for Glade Stanford MD, per request for intubation. Vial handed directly to MD.

## 2020-11-19 NOTE — ED Provider Notes (Signed)
[facility] EMERGENCY DEPARTMENT Provider Note   CSN: [id number] Arrival date & time: 11/19/20  1317     History Chief Complaint  Patient presents with   Stroke Symptoms    Maria Dyer is a 87 y.o. female.  Patient is a 87 year old female who presents with stroke symptoms.  Her grand niece is at bedside.  She was last seen normal by family members 2 days ago.  The grandniece did see her on a home video yesterday sitting on her recliner.  She went to take her out to lunch today and she was in her bed and had urinated in her bed.  She is having trouble communicating.  She does have a severe hearing deficit which makes assessment difficult but per the grandniece, she is not able to communicate well she seems to be trying to talk but her words are not making sense.  The patient is unable to communicate when her symptoms started.  When I asked her if she was like this when she woke up she nods yes.  When I asked her if she was like this when she went to bed last night, she shrugs her shoulders and says she does not know.  I am not sure how much she is hearing what I am communicating.  When we try to write out questions, she will answer some of them but not others.  Per her grandniece, she has a history of hypertension.  She is not on anticoagulants.      Past Medical History:  Diagnosis Date   Hypertension     There are no problems to display for this patient.   The histories are not reviewed yet. Please review them in the "History" navigator section and refresh this SmartLink.   OB History   No obstetric history on file.     No family history on file.     Home Medications Prior to Admission medications   Not on File    Allergies    Patient has no known allergies.  Review of Systems   Review of Systems  Unable to perform ROS: Mental status change   Physical Exam Updated Vital Signs BP (!) 175/56   Pulse 73   Temp 98.1 F (36.7 C) (Oral)    Resp (!) 21   SpO2 98%   Physical Exam Constitutional:      Appearance: She is well-developed.  HENT:     Head: Normocephalic and atraumatic.  Eyes:     Pupils: Pupils are equal, round, and reactive to light.  Cardiovascular:     Rate and Rhythm: Normal rate and regular rhythm.     Heart sounds: Normal heart sounds.  Pulmonary:     Effort: Pulmonary effort is normal. No respiratory distress.     Breath sounds: Normal breath sounds. No wheezing or rales.  Chest:     Chest wall: No tenderness.  Abdominal:     General: Bowel sounds are normal.     Palpations: Abdomen is soft.     Tenderness: There is no abdominal tenderness. There is no guarding or rebound.  Musculoskeletal:        General: Normal range of motion.     Cervical back: Normal range of motion and neck supple.  Lymphadenopathy:     Cervical: No cervical adenopathy.  Skin:    General: Skin is warm and dry.     Findings: No rash.  Neurological:     Mental Status: She is alert.  Comments: Patient has been able to tell me her name but cannot communicate where she is.  She is not following commands well but I do not appreciate any obvious focal deficit.    ED Results / Procedures / Treatments   Labs (all labs ordered are listed, but only abnormal results are displayed) Labs Reviewed  COMPREHENSIVE METABOLIC PANEL - Abnormal; Notable for the following components:      Result Value   Glucose, Bld 105 (*)    Creatinine, Ser 1.13 (*)    Albumin 3.4 (*)    GFR, Estimated 47 (*)    All other components within normal limits  I-STAT CHEM 8, ED - Abnormal; Notable for the following components:   Creatinine, Ser 1.10 (*)    Glucose, Bld 102 (*)    All other components within normal limits  RESP PANEL BY RT-PCR (FLU A&B, COVID) ARPGX2  ETHANOL  PROTIME-INR  APTT  CBC  DIFFERENTIAL  RAPID URINE DRUG SCREEN, HOSP PERFORMED  URINALYSIS, ROUTINE W REFLEX MICROSCOPIC    EKG EKG Interpretation  Date/Time:  Tuesday  November 19 2020 13:31:53 EDT Ventricular Rate:  71 PR Interval:  172 QRS Duration: 121 QT Interval:  453 QTC Calculation: 493 R Axis:   -26 Text Interpretation: Sinus rhythm Right bundle branch block No old tracing to compare Confirmed by Rolan Bucco [phone]) on 11/19/2020 1:59:16 PM Also confirmed by Rolan Bucco [phone]), editor Elita Quick ([id number])  on 11/19/2020 2:25:07 PM  Radiology No results found.  Procedures Procedures   Medications Ordered in ED Medications  iohexol (OMNIPAQUE) 350 MG/ML injection 100 mL (100 mLs Intravenous Contrast Given 11/19/20 1502)    ED Course  I have reviewed the triage vital signs and the nursing notes.  Pertinent labs & imaging results that were available during my care of the patient were reviewed by me and considered in my medical decision making (see chart for details).    MDM Rules/Calculators/A&P                           Patient is a 87 year old female who presents with aphasia.  Her exam is limited due to her severe hearing deficit.  However she does seem to have an aphasia even on writing questions.  However her last known normal on talking to family members was 2 days ago.  She was seen yesterday on a home camera sitting in her recliner but it was unclear at that time whether she had the aphasia or not.  For this reason, code stroke was not activated initially.  She had imaging ordered.  I did message Dr. Iver Nestle regarding her symptoms, but pt was out of window for treatment.  She did meet the patient in CT and noticed a hypodensity on her head CT.  They were able to find out more from family members that she was messaged on Facebook yesterday afternoon and was communicating normally at that time on [employer].  For this reason, IR was activated and patient will be taken to IR.  CRITICAL CARE Performed by: Rolan Bucco Total critical care time: 45 minutes Critical care time was exclusive of separately billable procedures  and treating other patients. Critical care was necessary to treat or prevent imminent or life-threatening deterioration. Critical care was time spent personally by me on the following activities: development of treatment plan with patient and/or surrogate as well as nursing, discussions with consultants, evaluation of patient's response to treatment, examination of  patient, obtaining history from patient or surrogate, ordering and performing treatments and interventions, ordering and review of laboratory studies, ordering and review of radiographic studies, pulse oximetry and re-evaluation of patient's condition.  Final Clinical Impression(s) / ED Diagnoses Final diagnoses:  Aphasia    Rx / DC Orders ED Discharge Orders     None        Rolan Bucco, MD 11/19/20 1513

## 2020-11-20 ENCOUNTER — Inpatient Hospital Stay (HOSPITAL_COMMUNITY): Payer: Medicare HMO

## 2020-11-20 ENCOUNTER — Encounter (HOSPITAL_COMMUNITY): Payer: Self-pay | Admitting: Radiology

## 2020-11-20 DIAGNOSIS — E7800 Pure hypercholesterolemia, unspecified: Secondary | ICD-10-CM

## 2020-11-20 DIAGNOSIS — I639 Cerebral infarction, unspecified: Secondary | ICD-10-CM | POA: Diagnosis not present

## 2020-11-20 DIAGNOSIS — I63512 Cerebral infarction due to unspecified occlusion or stenosis of left middle cerebral artery: Secondary | ICD-10-CM | POA: Diagnosis not present

## 2020-11-20 DIAGNOSIS — R4701 Aphasia: Secondary | ICD-10-CM

## 2020-11-20 LAB — GLUCOSE, CAPILLARY
Glucose-Capillary: 67 mg/dL — ABNORMAL LOW (ref 70–99)
Glucose-Capillary: 92 mg/dL (ref 70–99)
Glucose-Capillary: 78 mg/dL (ref 70–99)
Glucose-Capillary: 106 mg/dL — ABNORMAL HIGH (ref 70–99)
Glucose-Capillary: 87 mg/dL (ref 70–99)
Glucose-Capillary: 174 mg/dL — ABNORMAL HIGH (ref 70–99)

## 2020-11-20 LAB — CBC
HCT: 30.8 % — ABNORMAL LOW (ref 36.0–46.0)
Hemoglobin: 10.5 g/dL — ABNORMAL LOW (ref 12.0–15.0)
MCH: 32.3 pg (ref 26.0–34.0)
MCHC: 34.1 g/dL (ref 30.0–36.0)
MCV: 94.8 fL (ref 80.0–100.0)
Platelets: 174 10*3/uL (ref 150–400)
RBC: 3.25 MIL/uL — ABNORMAL LOW (ref 3.87–5.11)
RDW: 12.8 % (ref 11.5–15.5)
WBC: 5.8 10*3/uL (ref 4.0–10.5)
nRBC: 0 % (ref 0.0–0.2)

## 2020-11-20 LAB — COMPREHENSIVE METABOLIC PANEL
ALT: 12 U/L (ref 0–44)
AST: 22 U/L (ref 15–41)
Albumin: 2.9 g/dL — ABNORMAL LOW (ref 3.5–5.0)
Alkaline Phosphatase: 37 U/L — ABNORMAL LOW (ref 38–126)
Anion gap: 6 (ref 5–15)
BUN: 14 mg/dL (ref 8–23)
CO2: 27 mmol/L (ref 22–32)
Calcium: 8.5 mg/dL — ABNORMAL LOW (ref 8.9–10.3)
Chloride: 104 mmol/L (ref 98–111)
Creatinine, Ser: 1.06 mg/dL — ABNORMAL HIGH (ref 0.44–1.00)
GFR, Estimated: 51 mL/min — ABNORMAL LOW (ref >60)
Glucose, Bld: 105 mg/dL — ABNORMAL HIGH (ref 70–99)
Potassium: 3.4 mmol/L — ABNORMAL LOW (ref 3.5–5.1)
Sodium: 137 mmol/L (ref 135–145)
Total Bilirubin: 0.7 mg/dL (ref 0.3–1.2)
Total Protein: 5.8 g/dL — ABNORMAL LOW (ref 6.5–8.1)

## 2020-11-20 LAB — LIPID PANEL
Cholesterol: 205 mg/dL — ABNORMAL HIGH (ref 0–200)
HDL: 48 mg/dL (ref >40)
LDL Cholesterol: 139 mg/dL — ABNORMAL HIGH (ref 0–99)
Total CHOL/HDL Ratio: 4.3 RATIO
Triglycerides: 91 mg/dL (ref <150)
VLDL: 18 mg/dL (ref 0–40)

## 2020-11-20 LAB — ECHOCARDIOGRAM COMPLETE
Area-P 1/2: 5.54 cm2
S' Lateral: 2.6 cm
Weight: 2250.46 oz

## 2020-11-20 LAB — HEMOGLOBIN A1C
Hgb A1c MFr Bld: 5.9 % — ABNORMAL HIGH (ref 4.8–5.6)
Mean Plasma Glucose: 122.63 mg/dL

## 2020-11-20 LAB — TSH: TSH: 2.512 u[IU]/mL (ref 0.350–4.500)

## 2020-11-20 MED ORDER — POTASSIUM CHLORIDE CRYS ER 20 MEQ PO TBCR
40.0000 meq | EXTENDED_RELEASE_TABLET | ORAL | Status: AC
  Administered 2020-11-20: 40 meq via ORAL
  Filled 2020-11-20: qty 2

## 2020-11-20 MED ORDER — LABETALOL HCL 5 MG/ML IV SOLN
5.0000 mg | INTRAVENOUS | Status: DC | PRN
  Administered 2020-11-21: 10 mg via INTRAVENOUS
  Filled 2020-11-20: qty 4

## 2020-11-20 MED ORDER — INSULIN ASPART 100 UNIT/ML IJ SOLN
0.0000 [IU] | Freq: Three times a day (TID) | INTRAMUSCULAR | Status: DC
  Administered 2020-11-21: 1 [IU] via SUBCUTANEOUS
  Administered 2020-11-22 – 2020-11-23 (×3): 2 [IU] via SUBCUTANEOUS

## 2020-11-20 MED ORDER — CHLORHEXIDINE GLUCONATE CLOTH 2 % EX PADS
6.0000 | MEDICATED_PAD | Freq: Every day | CUTANEOUS | Status: DC
  Administered 2020-11-20 – 2020-11-24 (×5): 6 via TOPICAL

## 2020-11-20 MED ORDER — ATORVASTATIN CALCIUM 40 MG PO TABS
40.0000 mg | ORAL_TABLET | Freq: Every day | ORAL | Status: DC
  Administered 2020-11-20 – 2020-11-25 (×6): 40 mg via ORAL
  Filled 2020-11-20 (×6): qty 1

## 2020-11-20 MED ORDER — ZINC OXIDE 12.8 % EX OINT
TOPICAL_OINTMENT | CUTANEOUS | Status: DC | PRN
  Filled 2020-11-20 (×2): qty 56.7

## 2020-11-20 NOTE — Progress Notes (Signed)
Referring Physician(s): Dr. Iver Nestle  Supervising Physician: Baldemar Lenis  Patient Status:  [facility] - In-pt  Chief Complaint: Code Stroke  Subjective: Patient severely aphasic- receptive and expressive. Does not consistently show the ability to follow commands.  Appears to mimic.  Patient is tearful.  In mittens.   Allergies: Patient has no known allergies.  Medications: Prior to Admission medications   Medication Sig Start Date End Date Taking? Authorizing Provider  hydrochlorothiazide (HYDRODIURIL) 25 MG tablet Take 25 mg by mouth daily. 07/21/20  Yes [provider]  levothyroxine (SYNTHROID) 50 MCG tablet Take 50 mcg by mouth daily. 10/25/20  Yes [provider]  losartan (COZAAR) 100 MG tablet Take 100 mg by mouth daily. 10/06/20  Yes [provider]  metoprolol succinate (TOPROL-XL) 50 MG 24 hr tablet Take 50 mg by mouth 2 (two) times daily. 07/21/20  Yes [provider]     Vital Signs: BP (!) 156/67 Comment: will request PRN blood pressure meds  Pulse (!) 47   Temp 97.6 F (36.4 C) (Oral)   Resp 18   Wt 140 lb 10.5 oz (63.8 kg)   SpO2 (!) 85%   Physical Exam NAD, alert Neuro: alert, agitated/tearful, in mittens. Receptive and expressive aphasia. Appears to mimic, but not consistentyl follow commands. Moving all extremities.  Groin: soft, stable.  Site intact.  No evidence of hematoma or pseudoaneurysm.   Imaging: CT HEAD WO CONTRAST  Result Date: 11/19/2020 CLINICAL DATA:  Stroke, follow up Left M2 occlusion status post M1-M2 stenting. EXAM: CT HEAD WITHOUT CONTRAST TECHNIQUE: Contiguous axial images were obtained from the base of the skull through the vertex without intravenous contrast. COMPARISON:  Same day CT head. FINDINGS: Motion limited study.  Within this limitation: Brain: Similar mild hypoattenuation and possible loss of grey/white differentiation in the inferior left insula with possibly slightly  increased hypoattenuation in the anterior left temporal lobe, although streak artifact limits evaluation. No evidence of acute hemorrhage, hydrocephalus, mass lesion, midline shift, or visible extra-axial collection. Vascular: Status post endovascular stenting of the left MCA. Residual contrast in the vasculature. Skull: No acute fracture. Sinuses/Orbits: Mild paranasal sinus mucosal thickening. No acute renal findings. Other: No mastoid effusions. IMPRESSION: 1. Status post endovascular stenting of the left MCA. Possibly slight increase in edema/hypoattenuation in the anterior left temporal lobe, although streak limits evaluation in this region. If the patient is able, an MRI could better evaluate for acute infarct. 2. No evidence of acute hemorrhage. Electronically Signed   By: Feliberto Harts M.D.   On: 11/19/2020 20:06   MR ANGIO HEAD WO CONTRAST  Result Date: 11/20/2020 CLINICAL DATA:  Follow-up examination for acute stroke, left M2 occlusion, status post thrombectomy and stenting. EXAM: MRI HEAD WITHOUT CONTRAST MRA HEAD WITHOUT CONTRAST TECHNIQUE: Multiplanar, multi-echo pulse sequences of the brain and surrounding structures were acquired without intravenous contrast. Angiographic images of the Circle of Willis were acquired using MRA technique without intravenous contrast. COMPARISON:  Prior CTs from earlier the same day. FINDINGS: MRI HEAD FINDINGS Brain: Generalized age-related cerebral atrophy. Mild chronic microvascular ischemic disease noted involving the periventricular white matter. Remote lacunar infarct present at the posterior right lentiform nucleus/internal capsule. Few small remote bilateral cerebellar infarcts. Patchy restricted diffusion seen involving the left parietal and temporal lobes, consistent with acute left MCA distribution infarct (series 5, images 83, 74). Involvement is primarily cortical in nature, although there are some patchy subcortical involvement. Minimal associated  petechial hemorrhage without frank intraparenchymal hematoma (  series 14, image 33). No significant regional mass effect. No other evidence for acute or subacute ischemia. Gray-white matter differentiation otherwise maintained. No other acute or chronic intracranial hemorrhage elsewhere within the brain. No mass lesion, midline shift or mass effect. No hydrocephalus or extra-axial fluid collection. Pituitary gland suprasellar region normal. Midline structures intact. Vascular: Major intracranial vascular flow voids are maintained. Susceptibility artifact related to stenting noted at the left M1/M2 segments. Skull and upper cervical spine: Craniocervical junction within normal limits. Bone marrow signal intensity normal. No scalp soft tissue abnormality. Sinuses/Orbits: Globes and orbital soft tissues demonstrate no acute finding. Axial myopia noted. Scattered mucosal thickening noted throughout the paranasal sinuses. No air-fluid levels to suggest acute sinusitis. Moderate right with small left mastoid effusions. Visualized nasopharynx unremarkable. Other: 7 mm T2 hyperintense lesion seen within the right parotid gland (series 17, image 17), indeterminate. MRA HEAD FINDINGS Anterior circulation: Examination degraded by my a shin artifact. Distal cervical ICAs remain widely patent antegrade flow. Petrous segments widely patent. Atheromatous irregularity throughout the carotid siphons without high-grade stenosis. A1 segments, anterior communicating artery complex common anterior cerebral arteries remain patent without definite stenosis. Right M1 segment and distal right MCA branches well perfused and widely patent. Attenuated but patent flow within the proximal left M1 segment. Susceptibility artifact related to a vascular stent spanning the left M1-M2 segments. Flow through the stent itself is not assessed by MRA. Markedly attenuated flow seen beyond the stent within the distal left MCA branches. Posterior  circulation: Both vertebral arteries remain patent to the vertebrobasilar junction. Both PICA remain patent. Basilar diminutive but patent to its distal aspect without stenosis. Superior cerebral arteries patent bilaterally. Fetal type origin of both PCAs, both of which remain patent to their distal aspects. Anatomic variants: Fetal type origin of the PCAs with overall diminutive vertebrobasilar system. No intracranial aneurysm. IMPRESSION: MRI HEAD IMPRESSION: 1. Multifocal acute ischemic left MCA distribution infarcts involving the left parietal and temporal lobes. Minimal associated petechial hemorrhage without frank intraparenchymal hematoma or mass effect. 2. No other acute intracranial abnormality. 3. Underlying age-related cerebral atrophy with mild chronic small vessel ischemic disease, with remote lacunar infarcts involving the right lentiform nucleus/internal capsule, and bilateral cerebellar hemispheres. MRA HEAD IMPRESSION: 1. Technically limited exam due to motion artifact. 2. Interval placement of a vascular stent spanning the left M1-M2 segments. Flow within the stent itself is not assessed by MRA, obscured by susceptibility artifact. Markedly attenuated and reduced flow seen beyond the stent within the left MCA branches distally, raising the possibility for intra stent stenosis or other pathology. Finding could be further assessed with dedicated CTA or repeat arteriogram as warranted. 3. Otherwise stable intracranial MRA, with no other new or progressive finding. 4. Fetal type origin of the PCAs with overall diminutive vertebrobasilar system. Electronically Signed   By: Rise Mu M.D.   On: 11/20/2020 02:32   MR BRAIN WO CONTRAST  Result Date: 11/20/2020 CLINICAL DATA:  Follow-up examination for acute stroke, left M2 occlusion, status post thrombectomy and stenting. EXAM: MRI HEAD WITHOUT CONTRAST MRA HEAD WITHOUT CONTRAST TECHNIQUE: Multiplanar, multi-echo pulse sequences of the brain  and surrounding structures were acquired without intravenous contrast. Angiographic images of the Circle of Willis were acquired using MRA technique without intravenous contrast. COMPARISON:  Prior CTs from earlier the same day. FINDINGS: MRI HEAD FINDINGS Brain: Generalized age-related cerebral atrophy. Mild chronic microvascular ischemic disease noted involving the periventricular white matter. Remote lacunar infarct present at the posterior right lentiform nucleus/internal capsule. Few  small remote bilateral cerebellar infarcts. Patchy restricted diffusion seen involving the left parietal and temporal lobes, consistent with acute left MCA distribution infarct (series 5, images 83, 74). Involvement is primarily cortical in nature, although there are some patchy subcortical involvement. Minimal associated petechial hemorrhage without frank intraparenchymal hematoma (series 14, image 33). No significant regional mass effect. No other evidence for acute or subacute ischemia. Gray-white matter differentiation otherwise maintained. No other acute or chronic intracranial hemorrhage elsewhere within the brain. No mass lesion, midline shift or mass effect. No hydrocephalus or extra-axial fluid collection. Pituitary gland suprasellar region normal. Midline structures intact. Vascular: Major intracranial vascular flow voids are maintained. Susceptibility artifact related to stenting noted at the left M1/M2 segments. Skull and upper cervical spine: Craniocervical junction within normal limits. Bone marrow signal intensity normal. No scalp soft tissue abnormality. Sinuses/Orbits: Globes and orbital soft tissues demonstrate no acute finding. Axial myopia noted. Scattered mucosal thickening noted throughout the paranasal sinuses. No air-fluid levels to suggest acute sinusitis. Moderate right with small left mastoid effusions. Visualized nasopharynx unremarkable. Other: 7 mm T2 hyperintense lesion seen within the right parotid  gland (series 17, image 17), indeterminate. MRA HEAD FINDINGS Anterior circulation: Examination degraded by my a shin artifact. Distal cervical ICAs remain widely patent antegrade flow. Petrous segments widely patent. Atheromatous irregularity throughout the carotid siphons without high-grade stenosis. A1 segments, anterior communicating artery complex common anterior cerebral arteries remain patent without definite stenosis. Right M1 segment and distal right MCA branches well perfused and widely patent. Attenuated but patent flow within the proximal left M1 segment. Susceptibility artifact related to a vascular stent spanning the left M1-M2 segments. Flow through the stent itself is not assessed by MRA. Markedly attenuated flow seen beyond the stent within the distal left MCA branches. Posterior circulation: Both vertebral arteries remain patent to the vertebrobasilar junction. Both PICA remain patent. Basilar diminutive but patent to its distal aspect without stenosis. Superior cerebral arteries patent bilaterally. Fetal type origin of both PCAs, both of which remain patent to their distal aspects. Anatomic variants: Fetal type origin of the PCAs with overall diminutive vertebrobasilar system. No intracranial aneurysm. IMPRESSION: MRI HEAD IMPRESSION: 1. Multifocal acute ischemic left MCA distribution infarcts involving the left parietal and temporal lobes. Minimal associated petechial hemorrhage without frank intraparenchymal hematoma or mass effect. 2. No other acute intracranial abnormality. 3. Underlying age-related cerebral atrophy with mild chronic small vessel ischemic disease, with remote lacunar infarcts involving the right lentiform nucleus/internal capsule, and bilateral cerebellar hemispheres. MRA HEAD IMPRESSION: 1. Technically limited exam due to motion artifact. 2. Interval placement of a vascular stent spanning the left M1-M2 segments. Flow within the stent itself is not assessed by MRA, obscured  by susceptibility artifact. Markedly attenuated and reduced flow seen beyond the stent within the left MCA branches distally, raising the possibility for intra stent stenosis or other pathology. Finding could be further assessed with dedicated CTA or repeat arteriogram as warranted. 3. Otherwise stable intracranial MRA, with no other new or progressive finding. 4. Fetal type origin of the PCAs with overall diminutive vertebrobasilar system. Electronically Signed   By: Rise Mu M.D.   On: 11/20/2020 02:32   CT ANGIO HEAD NECK W WO CM W PERF (CODE STROKE)  Result Date: 11/19/2020 CLINICAL DATA:  Neuro deficit, acute, stroke suspected EXAM: CT ANGIOGRAPHY HEAD AND NECK CT PERFUSION BRAIN TECHNIQUE: Multidetector CT imaging of the head and neck was performed using the standard protocol during bolus administration of intravenous contrast. Multiplanar CT image  reconstructions and MIPs were obtained to evaluate the vascular anatomy. Carotid stenosis measurements (when applicable) are obtained utilizing NASCET criteria, using the distal internal carotid diameter as the denominator. Multiphase CT imaging of the brain was performed following IV bolus contrast injection. Subsequent parametric perfusion maps were calculated using RAPID software. CONTRAST:  OMNIPAQUE IOHEXOL 350 MG/ML SOLN COMPARISON:  None. FINDINGS: CT HEAD Brain: There is no acute intracranial hemorrhage, mass effect, or edema. There is loss of gray-white differentiation along the inferior insula and likely adjacent temporal lobe. There is no extra-axial fluid collection. Prominence of the ventricles and sulci reflects generalized parenchymal volume loss. Patchy low-attenuation in the supratentorial white matter is nonspecific but may reflect chronic microvascular ischemic changes. There is a small chronic left cerebellar infarct. Vascular: Focal hyperdensity is present along the left M1 MCA. Skull: Calvarium is unremarkable.  Sinuses/Orbits: No acute finding. Other: None. Review of the MIP images confirms the above findings CTA NECK Aortic arch: Mild mixed plaque is present along the aortic arch. There is irregular noncalcified plaque along the superior margin beyond the left subclavian origin. Right carotid system: Patent. Mild calcified plaque at the bifurcation. No stenosis. Left carotid system: Patent. Mild calcified plaque at the bifurcation and proximal internal carotid. No stenosis. Vertebral arteries: Patent. Left vertebral artery slightly dominant. No stenosis. Skeleton: Cervical spine degenerative changes. Vertebral body hemangioma at T1. Other neck: Unremarkable. Upper chest: No apical lung mass. Review of the MIP images confirms the above findings CTA HEAD Anterior circulation: Intracranial internal carotid arteries are patent with mild calcified plaque but no significant stenosis. Proximal left M1 MCA is patent. There is early bifurcation of the MCA with distal M1 proximal M2 occlusion. Partial reconstitution. There is high-grade stenosis or nonocclusive thrombus within anterior temporal branch approximately 5 mm beyond its origin arising just proximal to the occlusion. Right middle and both anterior cerebral arteries are patent. Posterior circulation: Intracranial vertebral arteries are patent with mixed plaque causing mild stenosis. Basilar artery is patent. Major cerebellar artery origins are patent. Bilateral posterior communicating arteries are present with fetal origin of both patent posterior cerebral arteries. Venous sinuses: Patent as allowed by contrast bolus timing. Review of the MIP images confirms the above findings CT Brain Perfusion Findings: CBF (<30%) Volume: 5mL Perfusion (Tmax>6.0s) volume: 44mL Mismatch Volume: 39mL Infarction Location: Left MCA territory IMPRESSION: No acute intracranial hemorrhage. Small acute left MCA territory infarction involving inferior insula and adjacent temporal lobe (ASPECT  score is 8). Early bifurcation of the left MCA with distal M1 and proximal M2 occlusion and subsequent partial reconstitution. High-grade stenosis or nonocclusive thrombus within anterior temporal branch approximately 5 mm beyond its origin just proximal to the occlusion. Perfusion imaging demonstrates 5 mL of core infarction partially corresponding to noncontrast CT abnormality. There is a calculated penumbra of 39 mL in the left MCA territory. No occlusion or hemodynamically significant stenosis in the neck. Initial results were communicated to Dr. Iver Nestle at 3:11 pm on 11/19/2020 by text page via the [facility] messaging system. Electronically Signed   By: Guadlupe Spanish M.D.   On: 11/19/2020 15:24    Labs:  CBC: Recent Labs    11/19/20 1401 11/19/20 1422 11/20/20 0446  WBC 6.9  --  5.8  HGB 12.5 12.2 10.5*  HCT 36.7 36.0 30.8*  PLT 227  --  174    COAGS: Recent Labs    11/19/20 1401  INR 1.1  APTT 27    BMP: Recent Labs    11/19/20  1401 11/19/20 1422 11/20/20 0446  NA 135 137 137  K 3.6 3.6 3.4*  CL 99 99 104  CO2 28  --  27  GLUCOSE 105* 102* 105*  BUN 20 21 14   CALCIUM 9.4  --  8.5*  CREATININE 1.13* 1.10* 1.06*  GFRNONAA 47*  --  51*    LIVER FUNCTION TESTS: Recent Labs    11/19/20 1401 11/20/20 0446  BILITOT 0.7 0.7  AST 20 22  ALT 13 12  ALKPHOS 41 37*  PROT 6.6 5.8*  ALBUMIN 3.4* 2.9*    Assessment and Plan: Left M2/MCA occlusion Patient stable this AM, extubated and attempting to converse.  Noted to have severe receptive and expressive aphasia.  Tearful, potentially due to inability to ask or answer questions/communicate.  Moving all extremities.  Groin site stable.  Ok to begin working with PT/OT from [employer] perspective.  Passed bedside swallow eval.  Got aspirin 81mg , Brilinta 90mg  PO this AM.  Electronically Signed: Hoyt KochKacie Sue-Ellen Leronda Lewers, PA 11/20/2020, 10:47 AM   I spent a total of 15 Minutes at the the patient's bedside AND on the patient's  hospital floor or unit, greater than 50% of which was counseling/coordinating care for Left MCA occlusion.

## 2020-11-20 NOTE — Progress Notes (Signed)
Inpatient Rehab Admissions Coordinator:   Per therapy recommendations,  patient was screened for CIR candidacy by Aboubacar Matsuo, MS, CCC-SLP . At this time, Pt. Appears to demonstrate medical necessity, functional decline, and ability to tolerate intensity of CIR. Pt. is a potential candidate for CIR. I will place   order for rehab consult per protocol for full assessment. Please contact me any with questions..  Scottie Stanish, MS, CCC-SLP Rehab Admissions Coordinator  [phone] (celll) [phone] (office)  

## 2020-11-20 NOTE — Progress Notes (Signed)
STROKE TEAM PROGRESS NOTE   INTERVAL HISTORY Her RN and PT/OT are at the bedside.  Pt sitting in bed, global aphasia, able to say "I don't know" "I wait here" but most of time random words and intangible. Not following commands. However, no facial droop, moving all extremities well.   Later I met pt niece and grandson in the room, I had long discussion with them at bedside, updated pt current condition, treatment plan and potential prognosis, and answered all the questions. They expressed understanding and appreciation.    OBJECTIVE Vitals:   11/20/20 0300 11/20/20 0400 11/20/20 0500 11/20/20 0600  BP: (!) 128/55 133/62 (!) 128/97 (!) 132/51  Pulse: 70 76 76 73  Resp: 12 (!) _0 Temp:  98.4 F (36.9 C)    TempSrc:  Oral    SpO2: 98% 90% 96% 97%  Weight:        CBC:  Recent Labs  Lab 11/19/20 1401 11/19/20 1422 11/20/20 0446  WBC 6.9  --  5.8  NEUTROABS 5.2  --   --   HGB 12.5 12.2 10.5*  HCT 36.7 36.0 30.8*  MCV 94.8  --  94.8  PLT 227  --  462    Basic Metabolic Panel:  Recent Labs  Lab 11/19/20 1401 11/19/20 1422 11/20/20 0446  NA 135 137 137  K 3.6 3.6 3.4*  CL 99 99 104  CO2 28  --  27  GLUCOSE 105* 102* 105*  BUN _1 CREATININE 1.13* 1.10* 1.06*  CALCIUM 9.4  --  8.5*    Lipid Panel:     Component Value Date/Time   CHOL 205 (H) 11/20/2020 0446   TRIG 91 11/20/2020 0446   HDL 48 11/20/2020 0446   CHOLHDL 4.3 11/20/2020 0446   VLDL 18 11/20/2020 0446   LDLCALC 139 (H) 11/20/2020 0446   HgbA1c:  Lab Results  Component Value Date   HGBA1C 5.9 (H) 11/20/2020   Urine Drug Screen:     Component Value Date/Time   LABOPIA NONE DETECTED 11/19/2020 1831   COCAINSCRNUR NONE DETECTED 11/19/2020 1831   LABBENZ NONE DETECTED 11/19/2020 1831   AMPHETMU NONE DETECTED 11/19/2020 1831   THCU NONE DETECTED 11/19/2020 1831   LABBARB NONE DETECTED 11/19/2020 1831    Alcohol Level     Component Value Date/Time   ETH <10 11/19/2020 1401     IMAGING   CT HEAD WO CONTRAST  Result Date: 11/19/2020 CLINICAL DATA:  Stroke, follow up Left M2 occlusion status post M1-M2 stenting. EXAM: CT HEAD WITHOUT CONTRAST TECHNIQUE: Contiguous axial images were obtained from the base of the skull through the vertex without intravenous contrast. COMPARISON:  Same day CT head. FINDINGS: Motion limited study.  Within this limitation: Brain: Similar mild hypoattenuation and possible loss of grey/white differentiation in the inferior left insula with possibly slightly increased hypoattenuation in the anterior left temporal lobe, although streak artifact limits evaluation. No evidence of acute hemorrhage, hydrocephalus, mass lesion, midline shift, or visible extra-axial collection. Vascular: Status post endovascular stenting of the left MCA. Residual contrast in the vasculature. Skull: No acute fracture. Sinuses/Orbits: Mild paranasal sinus mucosal thickening. No acute renal findings. Other: No mastoid effusions. IMPRESSION: 1. Status post endovascular stenting of the left MCA. Possibly slight increase in edema/hypoattenuation in the anterior left temporal lobe, although streak limits evaluation in this region. If the patient is able, an MRI could better evaluate for acute infarct. 2. No evidence of acute hemorrhage. Electronically Signed  By: Margaretha Sheffield M.D.   On: 11/19/2020 20:06   MR ANGIO HEAD WO CONTRAST  Result Date: 11/20/2020 CLINICAL DATA:  Follow-up examination for acute stroke, left M2 occlusion, status post thrombectomy and stenting. EXAM: MRI HEAD WITHOUT CONTRAST MRA HEAD WITHOUT CONTRAST TECHNIQUE: Multiplanar, multi-echo pulse sequences of the brain and surrounding structures were acquired without intravenous contrast. Angiographic images of the Circle of Willis were acquired using MRA technique without intravenous contrast. COMPARISON:  Prior CTs from earlier the same day. FINDINGS: MRI HEAD FINDINGS Brain: Generalized age-related  cerebral atrophy. Mild chronic microvascular ischemic disease noted involving the periventricular white matter. Remote lacunar infarct present at the posterior right lentiform nucleus/internal capsule. Few small remote bilateral cerebellar infarcts. Patchy restricted diffusion seen involving the left parietal and temporal lobes, consistent with acute left MCA distribution infarct (series 5, images 83, 74). Involvement is primarily cortical in nature, although there are some patchy subcortical involvement. Minimal associated petechial hemorrhage without frank intraparenchymal hematoma (series 14, image 33). No significant regional mass effect. No other evidence for acute or subacute ischemia. Gray-white matter differentiation otherwise maintained. No other acute or chronic intracranial hemorrhage elsewhere within the brain. No mass lesion, midline shift or mass effect. No hydrocephalus or extra-axial fluid collection. Pituitary gland suprasellar region normal. Midline structures intact. Vascular: Major intracranial vascular flow voids are maintained. Susceptibility artifact related to stenting noted at the left M1/M2 segments. Skull and upper cervical spine: Craniocervical junction within normal limits. Bone marrow signal intensity normal. No scalp soft tissue abnormality. Sinuses/Orbits: Globes and orbital soft tissues demonstrate no acute finding. Axial myopia noted. Scattered mucosal thickening noted throughout the paranasal sinuses. No air-fluid levels to suggest acute sinusitis. Moderate right with small left mastoid effusions. Visualized nasopharynx unremarkable. Other: 7 mm T2 hyperintense lesion seen within the right parotid gland (series 17, image 17), indeterminate. MRA HEAD FINDINGS Anterior circulation: Examination degraded by my a shin artifact. Distal cervical ICAs remain widely patent antegrade flow. Petrous segments widely patent. Atheromatous irregularity throughout the carotid siphons without  high-grade stenosis. A1 segments, anterior communicating artery complex common anterior cerebral arteries remain patent without definite stenosis. Right M1 segment and distal right MCA branches well perfused and widely patent. Attenuated but patent flow within the proximal left M1 segment. Susceptibility artifact related to a vascular stent spanning the left M1-M2 segments. Flow through the stent itself is not assessed by MRA. Markedly attenuated flow seen beyond the stent within the distal left MCA branches. Posterior circulation: Both vertebral arteries remain patent to the vertebrobasilar junction. Both PICA remain patent. Basilar diminutive but patent to its distal aspect without stenosis. Superior cerebral arteries patent bilaterally. Fetal type origin of both PCAs, both of which remain patent to their distal aspects. Anatomic variants: Fetal type origin of the PCAs with overall diminutive vertebrobasilar system. No intracranial aneurysm. IMPRESSION: MRI HEAD IMPRESSION: 1. Multifocal acute ischemic left MCA distribution infarcts involving the left parietal and temporal lobes. Minimal associated petechial hemorrhage without frank intraparenchymal hematoma or mass effect. 2. No other acute intracranial abnormality. 3. Underlying age-related cerebral atrophy with mild chronic small vessel ischemic disease, with remote lacunar infarcts involving the right lentiform nucleus/internal capsule, and bilateral cerebellar hemispheres. MRA HEAD IMPRESSION: 1. Technically limited exam due to motion artifact. 2. Interval placement of a vascular stent spanning the left M1-M2 segments. Flow within the stent itself is not assessed by MRA, obscured by susceptibility artifact. Markedly attenuated and reduced flow seen beyond the stent within the left MCA branches distally, raising  the possibility for intra stent stenosis or other pathology. Finding could be further assessed with dedicated CTA or repeat arteriogram as warranted.  3. Otherwise stable intracranial MRA, with no other new or progressive finding. 4. Fetal type origin of the PCAs with overall diminutive vertebrobasilar system. Electronically Signed   By: Jeannine Boga M.D.   On: 11/20/2020 02:32   MR BRAIN WO CONTRAST  Result Date: 11/20/2020 CLINICAL DATA:  Follow-up examination for acute stroke, left M2 occlusion, status post thrombectomy and stenting. EXAM: MRI HEAD WITHOUT CONTRAST MRA HEAD WITHOUT CONTRAST TECHNIQUE: Multiplanar, multi-echo pulse sequences of the brain and surrounding structures were acquired without intravenous contrast. Angiographic images of the Circle of Willis were acquired using MRA technique without intravenous contrast. COMPARISON:  Prior CTs from earlier the same day. FINDINGS: MRI HEAD FINDINGS Brain: Generalized age-related cerebral atrophy. Mild chronic microvascular ischemic disease noted involving the periventricular white matter. Remote lacunar infarct present at the posterior right lentiform nucleus/internal capsule. Few small remote bilateral cerebellar infarcts. Patchy restricted diffusion seen involving the left parietal and temporal lobes, consistent with acute left MCA distribution infarct (series 5, images 83, 74). Involvement is primarily cortical in nature, although there are some patchy subcortical involvement. Minimal associated petechial hemorrhage without frank intraparenchymal hematoma (series 14, image 33). No significant regional mass effect. No other evidence for acute or subacute ischemia. Gray-white matter differentiation otherwise maintained. No other acute or chronic intracranial hemorrhage elsewhere within the brain. No mass lesion, midline shift or mass effect. No hydrocephalus or extra-axial fluid collection. Pituitary gland suprasellar region normal. Midline structures intact. Vascular: Major intracranial vascular flow voids are maintained. Susceptibility artifact related to stenting noted at the left M1/M2  segments. Skull and upper cervical spine: Craniocervical junction within normal limits. Bone marrow signal intensity normal. No scalp soft tissue abnormality. Sinuses/Orbits: Globes and orbital soft tissues demonstrate no acute finding. Axial myopia noted. Scattered mucosal thickening noted throughout the paranasal sinuses. No air-fluid levels to suggest acute sinusitis. Moderate right with small left mastoid effusions. Visualized nasopharynx unremarkable. Other: 7 mm T2 hyperintense lesion seen within the right parotid gland (series 17, image 17), indeterminate. MRA HEAD FINDINGS Anterior circulation: Examination degraded by my a shin artifact. Distal cervical ICAs remain widely patent antegrade flow. Petrous segments widely patent. Atheromatous irregularity throughout the carotid siphons without high-grade stenosis. A1 segments, anterior communicating artery complex common anterior cerebral arteries remain patent without definite stenosis. Right M1 segment and distal right MCA branches well perfused and widely patent. Attenuated but patent flow within the proximal left M1 segment. Susceptibility artifact related to a vascular stent spanning the left M1-M2 segments. Flow through the stent itself is not assessed by MRA. Markedly attenuated flow seen beyond the stent within the distal left MCA branches. Posterior circulation: Both vertebral arteries remain patent to the vertebrobasilar junction. Both PICA remain patent. Basilar diminutive but patent to its distal aspect without stenosis. Superior cerebral arteries patent bilaterally. Fetal type origin of both PCAs, both of which remain patent to their distal aspects. Anatomic variants: Fetal type origin of the PCAs with overall diminutive vertebrobasilar system. No intracranial aneurysm. IMPRESSION: MRI HEAD IMPRESSION: 1. Multifocal acute ischemic left MCA distribution infarcts involving the left parietal and temporal lobes. Minimal associated petechial hemorrhage  without frank intraparenchymal hematoma or mass effect. 2. No other acute intracranial abnormality. 3. Underlying age-related cerebral atrophy with mild chronic small vessel ischemic disease, with remote lacunar infarcts involving the right lentiform nucleus/internal capsule, and bilateral cerebellar hemispheres. MRA HEAD IMPRESSION:  1. Technically limited exam due to motion artifact. 2. Interval placement of a vascular stent spanning the left M1-M2 segments. Flow within the stent itself is not assessed by MRA, obscured by susceptibility artifact. Markedly attenuated and reduced flow seen beyond the stent within the left MCA branches distally, raising the possibility for intra stent stenosis or other pathology. Finding could be further assessed with dedicated CTA or repeat arteriogram as warranted. 3. Otherwise stable intracranial MRA, with no other new or progressive finding. 4. Fetal type origin of the PCAs with overall diminutive vertebrobasilar system. Electronically Signed   By: Jeannine Boga M.D.   On: 11/20/2020 02:32   CT ANGIO HEAD NECK W WO CM W PERF (CODE STROKE)  Result Date: 11/19/2020 CLINICAL DATA:  Neuro deficit, acute, stroke suspected EXAM: CT ANGIOGRAPHY HEAD AND NECK CT PERFUSION BRAIN TECHNIQUE: Multidetector CT imaging of the head and neck was performed using the standard protocol during bolus administration of intravenous contrast. Multiplanar CT image reconstructions and MIPs were obtained to evaluate the vascular anatomy. Carotid stenosis measurements (when applicable) are obtained utilizing NASCET criteria, using the distal internal carotid diameter as the denominator. Multiphase CT imaging of the brain was performed following IV bolus contrast injection. Subsequent parametric perfusion maps were calculated using RAPID software. CONTRAST:  121m OMNIPAQUE IOHEXOL 350 MG/ML SOLN COMPARISON:  None. FINDINGS: CT HEAD Brain: There is no acute intracranial hemorrhage, mass effect, or  edema. There is loss of gray-white differentiation along the inferior insula and likely adjacent temporal lobe. There is no extra-axial fluid collection. Prominence of the ventricles and sulci reflects generalized parenchymal volume loss. Patchy low-attenuation in the supratentorial white matter is nonspecific but may reflect chronic microvascular ischemic changes. There is a small chronic left cerebellar infarct. Vascular: Focal hyperdensity is present along the left M1 MCA. Skull: Calvarium is unremarkable. Sinuses/Orbits: No acute finding. Other: None. Review of the MIP images confirms the above findings CTA NECK Aortic arch: Mild mixed plaque is present along the aortic arch. There is irregular noncalcified plaque along the superior margin beyond the left subclavian origin. Right carotid system: Patent. Mild calcified plaque at the bifurcation. No stenosis. Left carotid system: Patent. Mild calcified plaque at the bifurcation and proximal internal carotid. No stenosis. Vertebral arteries: Patent. Left vertebral artery slightly dominant. No stenosis. Skeleton: Cervical spine degenerative changes. Vertebral body hemangioma at T1. Other neck: Unremarkable. Upper chest: No apical lung mass. Review of the MIP images confirms the above findings CTA HEAD Anterior circulation: Intracranial internal carotid arteries are patent with mild calcified plaque but no significant stenosis. Proximal left M1 MCA is patent. There is early bifurcation of the MCA with distal M1 proximal M2 occlusion. Partial reconstitution. There is high-grade stenosis or nonocclusive thrombus within anterior temporal branch approximately 5 mm beyond its origin arising just proximal to the occlusion. Right middle and both anterior cerebral arteries are patent. Posterior circulation: Intracranial vertebral arteries are patent with mixed plaque causing mild stenosis. Basilar artery is patent. Major cerebellar artery origins are patent. Bilateral  posterior communicating arteries are present with fetal origin of both patent posterior cerebral arteries. Venous sinuses: Patent as allowed by contrast bolus timing. Review of the MIP images confirms the above findings CT Brain Perfusion Findings: CBF (<30%) Volume: 523mPerfusion (Tmax>6.0s) volume: 4471mismatch Volume: 39m58mfarction Location: Left MCA territory IMPRESSION: No acute intracranial hemorrhage. Small acute left MCA territory infarction involving inferior insula and adjacent temporal lobe (ASPECT score is 8). Early bifurcation of the left MCA with  distal M1 and proximal M2 occlusion and subsequent partial reconstitution. High-grade stenosis or nonocclusive thrombus within anterior temporal branch approximately 5 mm beyond its origin just proximal to the occlusion. Perfusion imaging demonstrates 5 mL of core infarction partially corresponding to noncontrast CT abnormality. There is a calculated penumbra of 39 mL in the left MCA territory. No occlusion or hemodynamically significant stenosis in the neck. Initial results were communicated to Dr. Curly Shores at 3:11 pm on 11/19/2020 by text page via the [facility] messaging system. Electronically Signed   By: Macy Mis M.D.   On: 11/19/2020 15:24     Neuro Interventional Radiology - Cerebral Angiogram with Intervention - de Rosario Jacks, MD 11/19/2020  5:04 PM Left M2/MCA near occlusion with very delayed contrast penetration. Mechanical thrombectomy performed with direct contact aspiration x2 with improved distal perfusion (TICI 2C). However, there was persistent severe stenosis. Patient was loaded on cangrelor and a 4.5 x 30 mm neuroform atlas stent was deployed spanning the left M1-M2 posterio division with significant improvement of the aterograde flow (TICI3). Delay angiogram showed no evidence of in stent thrombosis. Flat panel head CT showed no evidence of hemorrhagic complication. The patient tolerated the procedure well without  incident or complication and is in stable condition.  PLAN: - Bed rest post left femoral access x 6 hours - Head CT around 7:30 pm. It is important that the patient have the CT done before cangrelor drip ends (8pm). - If no bleed on head CT, stop cangrelor and load ticagrelor 180 mg and ASA 81 mg. Please place NG tube if patient is unable to swallow. - If bleed on head CT, please contact neurology and neuro IR. - SBP 120-140 mmHg (24h post procedure)   Transthoracic Echocardiogram  00/00/2021 Pending  ECG - SR rate 71 BPM. (See cardiology reading for complete details)  PHYSICAL EXAM  Temp:  [97 F (36.1 C)-98.4 F (36.9 C)] 97.6 F (36.4 C) (08/24 0700) Pulse Rate:  [47-81] 47 (08/24 0700) Resp:  [0-22] 18 (08/24 1000) BP: (120-175)/(48-99) 156/67 (08/24 1000) SpO2:  [85 %-100 %] 85 % (08/24 0700) Weight:  [63.8 kg] 63.8 kg (08/23 1815)  General - Well nourished, well developed, in no apparent distress.  Ophthalmologic - fundi not visualized due to noncooperation.  Cardiovascular - Regular rhythm but mild tachycardia working with PT/OT.  Neuro - awake, alert, eyes open, global aphasia, not following all simple commands, able to have random words out, intangible but did have intermittent meaningful short sentences but not appropriate with situation. Not able to name or repeat or read. No gaze palsy, tracking bilaterally, blinking to visual threat bilaterally. No facial droop. Tongue protrusion not cooperative. Bilateral UEs at least 4/5. Bilaterally LEs at least 3/5. Sensation, coordination and gait not tested.    ASSESSMENT/PLAN [name] is a 87 y.o. female with history of essential hypertension, severe hearing impairment, breast cancer s/p mastectomy over 25 years ago per great niece, thoracolumbar scoliosis, and hypothyroidism who presented to the ED via EMS from home for evaluation of garbled and dysarthric speech.  She did not receive IV t-PA due to late  presentation (>4.5 hours from time of onset).  Stroke: L MCA patchy infarcts - embolic - source unknown.  CT head - No acute intracranial hemorrhage. Small acute left MCA territory infarction involving inferior insula and adjacent temporal lobe (ASPECT score is 8). CTA H&N - Early bifurcation of the left MCA with distal M1 and proximal M2 occlusion and subsequent partial  reconstitution. High-grade stenosis or nonocclusive thrombus within anterior temporal branch approximately 5 mm beyond its origin just proximal to the occlusion.  CTP - 5/44, positive penumbra. IR - left M2 near occlusion -> TICI2c -> Persistent severe stenosis -> stenting -> TICI3 MRI head - Multifocal acute ischemic left MCA distribution infarcts involving the left parietal and temporal lobes.  MRA head - Interval placement of a vascular stent spanning the left M1-M2 segments. Markedly attenuated and reduced flow seen beyond the stent within the left MCA branches distally, raising the possibility for intra stent stenosis or other pathology. Fetal type origin of the PCAs with overall diminutive vertebrobasilar system. LE venous doppler pending 2D Echo - EF 60-65% Consider loop recorder if embolic work up negative.  Sars Corona Virus 2 - negative LDL - 139 HgbA1c - 5.9 UDS - negative VTE prophylaxis - SCDs No antithrombotic prior to admission, now on aspirin 81 mg daily and Brilinta (ticagrelor) 90 mg bid Patient will be counseled to be compliant with her antithrombotic medications Ongoing aggressive stroke risk factor management Therapy recommendations:  CIR Disposition:  Pending  Hypertension Home BP meds: Cozaar ; HCTZ ; metoprolol Stable SBP 120-140 mmHg (24h post procedure) per IR Long-term BP goal normotensive  Hyperlipidemia Home Lipid lowering medication: none  LDL 139, goal < 70 Put on lipitor 40 Continue statin at discharge  Other Stroke Risk Factors Advanced age  Other Active Problems, Findings,  Recommendations and/or Plan Code status - Full code Hypokalemia - 3.4 - supplement AKI - creatinine - 1.13->1.10->1.06 Remote breast cancer s/p Sx   Hospital day # 1  This patient is critically ill due to left MCA stroke s/p thrombectomy, AKI and at significant risk of neurological worsening, death form recurrent stroke, hemorrhagic conversion, renal failure, seizure. This patient's care requires constant monitoring of vital signs, hemodynamics, respiratory and cardiac monitoring, review of multiple databases, neurological assessment, discussion with family, other specialists and medical decision making of high complexity. I spent 45 minutes of neurocritical care time in the care of this patient. I had long discussion with them at bedside, updated pt current condition, treatment plan and potential prognosis, and answered all the questions. They expressed understanding and appreciation.   Rosalin Hawking, MD PhD Stroke Neurology 11/20/2020 12:22 PM   To contact Stroke Continuity provider, please refer to [email or web address] After hours, contact General Neurology

## 2020-11-20 NOTE — Evaluation (Signed)
Speech Language Pathology Evaluation Patient Details Name: Maria Dyer MRN: [id number] DOB: Aug 13, 1930 Today's Date: 11/20/2020 Time: 4650-3546 SLP Time Calculation (min) (ACUTE ONLY): 22 min  Problem List:  Patient Active Problem List   Diagnosis Date Noted   Acute ischemic left MCA stroke (HCC) 11/19/2020   Pressure injury of skin 11/19/2020   Past Medical History:  Past Medical History:  Diagnosis Date   Hypertension    Past Surgical History: The histories are not reviewed yet. Please review them in the "History" navigator section and refresh this SmartLink. HPI:  87 yo F w PMHx of HTN, severe hearing impairment, breast cancer, thoracolumbar scoliosis, and hypothyroidism found in bed aphasic by family member and presented to the ED for evaluation of dysarthric speech. Imaging revealed multifocal acute ischemic left MCA distribution infarcts involving the left parietal and temporal lobes and minimal associated petechial hemorrhage without frank intraparenchymal hematoma or mass effect.   Assessment / Plan / Recommendation Clinical Impression  Pt assessed upright in chair with neice and great nephew present. Family states at baseline, she has no s/s of dementia and manages her own medications and finances. Assessment impacted by pt's severe hearing loss requiring use of writing to relay information with use of white board. Pt responded to 2 yes/no written questions with 50% response and appeared to guess then began shaking her head, pointing to the door, and saying "no, I'm gone" in response to assessment tasks. Pt did not follow commands. SLP followed with attempts at confrontation naming with pt resonding "I don't know" SLP declined to write her name. Throughout session, pt speech ascertained to be fluent resembling Wernikes type aphasia. Ouput included phrases of accurate/appropriate language to relay thought in addition to jargon and neologisms. Pt not aware of language errors.   Family stated concerns about reading ability due to her love of and frequency of reading. Recommend SLP f/u for family education and pt readiness for compensatory communication strategies. Recommend inpatient rehab if qualifies and 24 hour supervision. Will continue to see for further diagnostic treatment.    SLP Assessment  SLP Recommendation/Assessment: Patient needs continued Speech Lanaguage Pathology Services SLP Visit Diagnosis: Aphasia (R47.01);Cognitive communication deficit (R41.841)    Follow Up Recommendations  Inpatient Rehab    Frequency and Duration min 2x/week  2 weeks      SLP Evaluation Cognition  Overall Cognitive Status: Impaired/Different from baseline Arousal/Alertness: Awake/alert Orientation Level:  (no response to yes/no questions) Attention: Sustained Sustained Attention: Impaired Sustained Attention Impairment: Functional basic Awareness: Impaired Awareness Impairment: Anticipatory impairment;Emergent impairment Problem Solving:  (will continue diagnostic treatment) Behaviors: Perseveration Safety/Judgment: Impaired Comments: Pulls of medical equipment, states she will be going home with her neice today.       Comprehension  Auditory Comprehension Overall Auditory Comprehension: Impaired (see impressions) Visual Recognition/Discrimination Discrimination: Not tested Reading Comprehension Reading Status: Impaired Word level: Impaired Sentence Level: Impaired Paragraph Level: Not tested Functional Environmental (signs, name badge): Not tested Interfering Components: Eye glasses not available;Other (comment) (aphasia)    Expression Expression Primary Mode of Expression: Verbal Verbal Expression Overall Verbal Expression: Impaired Initiation: No impairment Level of Generative/Spontaneous Verbalization: Phrase;Word Repetition:  (NT) Naming: Impairment Responsive: Not tested Confrontation: Impaired Convergent: Not tested Divergent: Not  tested Verbal Errors: Not aware of errors;Jargon Pragmatics: No impairment Interfering Components: Attention Written Expression Dominant Hand: Right Written Expression: Unable to assess (comment) (Attempted; pt would not write when given marker)   Oral / Motor  Motor Speech Overall Motor Speech: Appears within functional  limits for tasks assessed   GO           Jeannie Done, SLP-Student         Jeannie Done 11/20/2020, 2:20 PM

## 2020-11-20 NOTE — Progress Notes (Signed)
Echocardiogram 2D Echocardiogram has been performed.  Warren Lacy Whitley Patchen RDCS 11/20/2020, 10:05 AM

## 2020-11-20 NOTE — Progress Notes (Signed)
Lower extremity venous bilateral study completed.   Please see CV Proc for preliminary results.   Jobeth Pangilinan, RDMS, RVT  

## 2020-11-20 NOTE — Progress Notes (Addendum)
Patient's CBG was 67 for 0400 (taken at 0350) CBG check. Patient was given 4oz of apple juice - RN will follow up with another CBG check at 0405.   Beryl Meager, RN

## 2020-11-20 NOTE — Plan of Care (Signed)
  Problem: Coping: Goal: Will identify appropriate support needs Outcome: Progressing   Problem: Nutrition: Goal: Risk of aspiration will decrease Outcome: Progressing Goal: Dietary intake will improve Outcome: Progressing   Problem: Ischemic Stroke/TIA Tissue Perfusion: Goal: Complications of ischemic stroke/TIA will be minimized Outcome: Progressing   Problem: Clinical Measurements: Goal: Ability to maintain clinical measurements within normal limits will improve Outcome: Progressing Goal: Will remain free from infection Outcome: Progressing Goal: Diagnostic test results will improve Outcome: Progressing Goal: Respiratory complications will improve Outcome: Progressing Goal: Cardiovascular complication will be avoided Outcome: Progressing   Problem: Nutrition: Goal: Adequate nutrition will be maintained Outcome: Progressing   Problem: Coping: Goal: Level of anxiety will decrease Outcome: Progressing   Problem: Elimination: Goal: Will not experience complications related to bowel motility Outcome: Progressing Goal: Will not experience complications related to urinary retention Outcome: Progressing   Problem: Pain Managment: Goal: General experience of comfort will improve Outcome: Progressing   Problem: Safety: Goal: Ability to remain free from injury will improve Outcome: Progressing   Problem: Education: Goal: Knowledge of disease or condition will improve Outcome: Not Progressing Goal: Knowledge of secondary prevention will improve Outcome: Not Progressing Goal: Knowledge of patient specific risk factors addressed and post discharge goals established will improve Outcome: Not Progressing Goal: Individualized Educational Video(s) Outcome: Not Progressing   Problem: Coping: Goal: Will verbalize positive feelings about self Outcome: Not Progressing   Problem: Health Behavior/Discharge Planning: Goal: Ability to manage health-related needs will  improve Outcome: Not Progressing   Problem: Self-Care: Goal: Ability to participate in self-care as condition permits will improve Outcome: Not Progressing Goal: Verbalization of feelings and concerns over difficulty with self-care will improve Outcome: Not Progressing Goal: Ability to communicate needs accurately will improve Outcome: Not Progressing   Problem: Education: Goal: Knowledge of General Education information will improve Description: Including pain rating scale, medication(s)/side effects and non-pharmacologic comfort measures Outcome: Not Progressing   Problem: Health Behavior/Discharge Planning: Goal: Ability to manage health-related needs will improve Outcome: Not Progressing   Problem: Activity: Goal: Risk for activity intolerance will decrease Outcome: Not Progressing   Problem: Skin Integrity: Goal: Risk for impaired skin integrity will decrease Outcome: Not Progressing

## 2020-11-20 NOTE — Evaluation (Signed)
Physical Therapy Evaluation Patient Details Name: Maria Dyer MRN: [id number] DOB: Jul 06, 1930 Today's Date: 11/20/2020   History of Present Illness  87 yo female L MCA near occlusion with revascularization TICI 3.  PMH HTN hypothroidism, severe hearing impairment Breast CAs/p mastectomy  thoracolumbar scoliosis  Clinical Impression  PTA, pt lives alone, is modI with mobility using a walker vs w/c, and is independent with ADL's. Pt presents with global aphasia, weakness, poor balance. Pt unable to follow one step commands, but is able to mimic. Able to verbalize need to "sit down," and "come see me," when referencing her family. Pt requiring two person moderate assist for stand pivot transfers. Based on PLOF and motivation, recommend CIR to address deficits and maximize functional mobility.     Follow Up Recommendations CIR    Equipment Recommendations  3in1 (PT)    Recommendations for Other Services Rehab consult     Precautions / Restrictions Precautions Precautions: Fall;Other (comment) Precaution Comments: SBP 120-140, global aphasia Restrictions Weight Bearing Restrictions: No      Mobility  Bed Mobility Overal bed mobility: Needs Assistance Bed Mobility: Supine to Sit     Supine to sit: Min assist     General bed mobility comments: Assist for initiation and then pt able to execute    Transfers Overall transfer level: Needs assistance Equipment used: None;Rolling walker (2 wheeled) Transfers: Sit to/from [employer] Sit to Stand: Mod assist;+2 physical assistance Stand pivot transfers: Mod assist;+2 physical assistance       General transfer comment: Pt performed stand pivot with no AD to recliner, holding onto back of therapist's elbows and then x 2 sit to stands from recliner up to RW. ModA + 2 overall  Ambulation/Gait                Stairs            Wheelchair Mobility    Modified Rankin (Stroke Patients Only) Modified  Rankin (Stroke Patients Only) Pre-Morbid Rankin Score: Slight disability Modified Rankin: Moderately severe disability     Balance Overall balance assessment: Needs assistance Sitting-balance support: Feet unsupported Sitting balance-Leahy Scale: Poor Sitting balance - Comments: Multiple episodes of posterior LOB with no foot support, requiring minA   Standing balance support: Bilateral upper extremity supported Standing balance-Leahy Scale: Poor Standing balance comment: reliant on RW                             Pertinent Vitals/Pain Pain Assessment: Faces Faces Pain Scale: No hurt    Home Living Family/patient expects to be discharged to:: Private residence Living Arrangements: Alone Available Help at Discharge: Family;Available PRN/intermittently Type of Home: House         Home Equipment: Walker - 2 wheels;Wheelchair - manual      Prior Function Level of Independence: Needs assistance   Gait / Transfers Assistance Needed: uses walker, mainly household ambulator. w/c for longer distances out in community  ADL's / Homemaking Assistance Needed: independent ADL's, medications, finances. pt family assists with transportation        Hand Dominance        Extremity/Trunk Assessment   Upper Extremity Assessment Upper Extremity Assessment: Defer to OT evaluation    Lower Extremity Assessment Lower Extremity Assessment: Generalized weakness       Communication   Communication: Receptive difficulties;Expressive difficulties  Cognition Arousal/Alertness: Awake/alert Behavior During Therapy: WFL for tasks assessed/performed Overall Cognitive Status: Impaired/Different from baseline Area  of Impairment: Following commands                       Following Commands: Follows one step commands inconsistently       General Comments: Pt with global aphasia, responds "yeah," to most questions and with attempts to read written questions such as  "what is your name?" Difficult to understand speech, however, with verbalizing needs, able to complete sentences such as, "I need to sit down," or when family entered the room, "come see me." Unable to follow commands but able to mimic. She is pleasant and smiling throughout.      General Comments      Exercises     Assessment/Plan    PT Assessment Patient needs continued PT services  PT Problem List Decreased strength;Decreased activity tolerance;Decreased balance;Decreased mobility;Decreased cognition;Decreased safety awareness       PT Treatment Interventions DME instruction;Gait training;Functional mobility training;Therapeutic activities;Therapeutic exercise;Balance training;Patient/family education    PT Goals (Current goals can be found in the Care Plan section)  Acute Rehab PT Goals Patient Stated Goal: pt unable to state PT Goal Formulation: With patient Time For Goal Achievement: 12/04/20 Potential to Achieve Goals: Good    Frequency Min 4X/week   Barriers to discharge        Co-evaluation PT/OT/SLP Co-Evaluation/Treatment: Yes Reason for Co-Treatment: For patient/therapist safety;To address functional/ADL transfers;Necessary to address cognition/behavior during functional activity PT goals addressed during session: Mobility/safety with mobility         AM-PAC PT "6 Clicks" Mobility  Outcome Measure Help needed turning from your back to your side while in a flat bed without using bedrails?: A Little Help needed moving from lying on your back to sitting on the side of a flat bed without using bedrails?: A Little Help needed moving to and from a bed to a chair (including a wheelchair)?: A Lot Help needed standing up from a chair using your arms (e.g., wheelchair or bedside chair)?: A Lot Help needed to walk in hospital room?: Total Help needed climbing 3-5 steps with a railing? : Total 6 Click Score: 12    End of Session Equipment Utilized During Treatment:  Gait belt Activity Tolerance: Patient tolerated treatment well Patient left: in chair;with call bell/phone within reach;with chair alarm set Nurse Communication: Mobility status PT Visit Diagnosis: Unsteadiness on feet (R26.81);Muscle weakness (generalized) (M62.81);Difficulty in walking, not elsewhere classified (R26.2)    Time: 1030-1058 PT Time Calculation (min) (ACUTE ONLY): 28 min   Charges:   PT Evaluation $PT Eval Moderate Complexity: 1 Mod          Lillia Pauls, PT, DPT Acute Rehabilitation Services Pager [phone] Office [phone]   Norval Morton 11/20/2020, 11:52 AM

## 2020-11-20 NOTE — Consult Note (Signed)
WOC Nurse Consult Note: Patient receiving care in [facility] [phone] Reason for Consult: MASD buttocks and groin Wound type: Moisture associated irritation on the buttocks and groin areas Pressure Injury POA: NA Dressing procedure/placement/frequency: Clean the buttocks and groin area with soap and water, rinse and pat dry. Apply triple paste to the area twice daily or PRN soiling.   Monitor the wound area(s) for worsening of condition such as: Signs/symptoms of infection, increase in size, development of or worsening of odor, development of pain, or increased pain at the affected locations.   Notify the medical team if any of these develop.  Pressure Injury Prevention Bundle May use any that apply to this patient. Support surfaces (air mattress) chair cushion [place] # [phone]) Heel offloading boots [employer] # [phone]) Turning and Positioning  Measures to reduce shear (draw sheet, knees up) Skin protection Products (Foam dressing) Moisture management products (Critic-Aid Barrier Cream (Purple top) Sween moisturizing lotion (Pink top in clean supply) Nutrition Management Protection for Medical Devices Routine Skin Assessment   Thank you for the consult. WOC nurse will not follow at this time.   Please re-consult the WOC team if needed.  Renaldo Reel Katrinka Blazing, MSN, RN, CMSRN, Angus Seller, [facility] Wound Treatment Associate Pager [phone]

## 2020-11-20 NOTE — Evaluation (Signed)
Occupational Therapy Evaluation Patient Details Name: Maria Dyer MRN: [id number] DOB: Apr 24, 1930 Today's Date: 11/20/2020    History of Present Illness 87 yo female L MCA near occlusion with revascularization TICI 3.  PMH HTN hypothroidism, severe hearing impairment Breast CAs/p mastectomy  thoracolumbar scoliosis   Clinical Impression   PT admitted with L MCA with revascularization. Pt currently with functional limitiations due to the deficits listed below (see OT problem list). Pt noted to have skin break down in peri area and RN contacting wound care. Pt global aphasic responses throughtout session. Pt pleasant and agreeable.  Pt will benefit from skilled OT to increase their independence and safety with adls and balance to allow discharge CIR.     Follow Up Recommendations  CIR    Equipment Recommendations  3 in 1 bedside commode    Recommendations for Other Services Rehab consult     Precautions / Restrictions Precautions Precautions: Fall;Other (comment) Precaution Comments: SBP 120-140, global aphasia Restrictions Weight Bearing Restrictions: No      Mobility Bed Mobility Overal bed mobility: Needs Assistance Bed Mobility: Supine to Sit     Supine to sit: Min assist     General bed mobility comments: Assist for initiation and then pt able to execute    Transfers Overall transfer level: Needs assistance Equipment used: None;Rolling walker (2 wheeled) Transfers: Sit to/from [employer] Sit to Stand: Mod assist;+2 physical assistance Stand pivot transfers: Mod assist;+2 physical assistance       General transfer comment: Pt performed stand pivot with no AD to recliner, holding onto back of therapist's elbows and then x 2 sit to stands from recliner up to RW. ModA + 2 overall    Balance Overall balance assessment: Needs assistance Sitting-balance support: Feet unsupported Sitting balance-Leahy Scale: Poor Sitting balance - Comments:  Multiple episodes of posterior LOB with no foot support, requiring minA   Standing balance support: Bilateral upper extremity supported Standing balance-Leahy Scale: Poor Standing balance comment: reliant on RW                           ADL either performed or assessed with clinical judgement   ADL Overall ADL's : Needs assistance/impaired Eating/Feeding: Minimal assistance;Sitting Eating/Feeding Details (indicate cue type and reason): eating an egg sandwich provided by family. Grooming: Minimal assistance;Sitting         Lower Body Bathing Details (indicate cue type and reason): pt noted to have skin break down in peri area that was prior to admission. question hygiene and detail for task prior. pt noted to have blood on pad in room. RN to consult wound care for area after visualizing with therapist helping sustain standing         Toilet Transfer: +2 for physical assistance;Moderate assistance Toilet Transfer Details (indicate cue type and reason): stand pivot to chair simulated                 Vision   Vision Assessment?:  (difficult to assess) Additional Comments: pt     Perception     Praxis      Pertinent Vitals/Pain Pain Assessment: No/denies pain Faces Pain Scale: No hurt     Hand Dominance Right   Extremity/Trunk Assessment Upper Extremity Assessment Upper Extremity Assessment: RUE deficits/detail RUE Deficits / Details: weakness difficulty grasping sandwich   Lower Extremity Assessment Lower Extremity Assessment: Defer to PT evaluation   Cervical / Trunk Assessment Cervical / Trunk Assessment: Kyphotic  Communication Communication Communication: Receptive difficulties;Expressive difficulties   Cognition Arousal/Alertness: Awake/alert Behavior During Therapy: WFL for tasks assessed/performed Overall Cognitive Status: Impaired/Different from baseline Area of Impairment: Following commands                       Following  Commands: Follows one step commands inconsistently       General Comments: Pt with global aphasia, responds "yeah," to most questions and with attempts to read written questions such as "what is your name?" Difficult to understand speech, however, with verbalizing needs, able to complete sentences such as, "I need to sit down," or when family entered the room, "come see me." Unable to follow commands but able to mimic. She is pleasant and smiling throughout.   General Comments  skin break down noted in peri area and RN asked to assess with therapist helping for balance    Exercises     Shoulder Instructions      Home Living Family/patient expects to be discharged to:: Private residence Living Arrangements: Alone Available Help at Discharge: Family;Available PRN/intermittently Type of Home: House                       Home Equipment: Walker - 2 wheels;Wheelchair - manual   Additional Comments: niece and nephew present work as Regulatory affairs officer attendants that are based out of [employer]. they meet once a week for lunch      Prior Functioning/Environment Level of Independence: Needs assistance  Gait / Transfers Assistance Needed: uses walker, mainly household ambulator. w/c for longer distances out in community ADL's / Homemaking Assistance Needed: independent ADL's, medications, finances. pt family assists with transportation            OT Problem List: Decreased activity tolerance;Impaired balance (sitting and/or standing);Decreased cognition;Decreased knowledge of use of DME or AE;Decreased safety awareness;Decreased knowledge of precautions;Cardiopulmonary status limiting activity;Obesity      OT Treatment/Interventions: Self-care/ADL training;Therapeutic exercise;Energy conservation;Neuromuscular education;DME and/or AE instruction;Manual therapy;Therapeutic activities;Cognitive remediation/compensation;Visual/perceptual remediation/compensation;Patient/family education;Balance  training    OT Goals(Current goals can be found in the care plan section) Acute Rehab OT Goals Patient Stated Goal: pt unable to state OT Goal Formulation: Patient unable to participate in goal setting Potential to Achieve Goals: Good  OT Frequency: Min 2X/week   Barriers to D/C: Decreased caregiver support  lives alone       Co-evaluation PT/OT/SLP Co-Evaluation/Treatment: Yes Reason for Co-Treatment: For patient/therapist safety;To address functional/ADL transfers PT goals addressed during session: Mobility/safety with mobility OT goals addressed during session: ADL's and self-care;Proper use of Adaptive equipment and DME      AM-PAC OT "6 Clicks" Daily Activity     Outcome Measure Help from another person eating meals?: A Lot Help from another person taking care of personal grooming?: A Lot Help from another person toileting, which includes using toliet, bedpan, or urinal?: A Lot Help from another person bathing (including washing, rinsing, drying)?: A Lot Help from another person to put on and taking off regular upper body clothing?: A Lot Help from another person to put on and taking off regular lower body clothing?: A Lot 6 Click Score: 12   End of Session Equipment Utilized During Treatment: Gait belt Nurse Communication: Mobility status;Precautions  Activity Tolerance: Patient tolerated treatment well Patient left: in chair;with call bell/phone within reach;with chair alarm set;with nursing/sitter in room;with family/visitor present  OT Visit Diagnosis: Muscle weakness (generalized) (M62.81)  Time: 9622-2979 OT Time Calculation (min): 26 min Charges:  OT General Charges $OT Visit: 1 Visit OT Evaluation $OT Eval Moderate Complexity: 1 Mod   Brynn, OTR/L  Acute Rehabilitation Services Pager: [phone] Office: [phone] .   Mateo Flow 11/20/2020, 12:06 PM

## 2020-11-20 NOTE — Progress Notes (Signed)
CBG at 0405 was 92. 4oz of juice was effective in raising CBG.   Beryl Meager, RN

## 2020-11-21 ENCOUNTER — Inpatient Hospital Stay (HOSPITAL_COMMUNITY): Payer: Medicare HMO

## 2020-11-21 LAB — GLUCOSE, CAPILLARY
Glucose-Capillary: 113 mg/dL — ABNORMAL HIGH (ref 70–99)
Glucose-Capillary: 103 mg/dL — ABNORMAL HIGH (ref 70–99)
Glucose-Capillary: 125 mg/dL — ABNORMAL HIGH (ref 70–99)

## 2020-11-21 MED ORDER — METOPROLOL SUCCINATE ER 50 MG PO TB24
50.0000 mg | ORAL_TABLET | Freq: Every day | ORAL | Status: DC
  Administered 2020-11-21 – 2020-11-25 (×5): 50 mg via ORAL
  Filled 2020-11-21 (×5): qty 1

## 2020-11-21 MED ORDER — LOSARTAN POTASSIUM 50 MG PO TABS
100.0000 mg | ORAL_TABLET | Freq: Every day | ORAL | Status: DC
  Administered 2020-11-21 – 2020-11-25 (×5): 100 mg via ORAL
  Filled 2020-11-21 (×5): qty 2

## 2020-11-21 MED ORDER — ENSURE ENLIVE PO LIQD
237.0000 mL | Freq: Two times a day (BID) | ORAL | Status: DC
  Administered 2020-11-21 – 2020-11-25 (×7): 237 mL via ORAL

## 2020-11-21 NOTE — Progress Notes (Signed)
Inpatient Rehab Admissions Coordinator:   Consult received.  Met with patient and her niece at the bedside.  Pt is extremely hard of hearing, only smiling and nodding at me.  I spoke to pt's niece, Nancy, at the bedside and I also called her grand niece, Danielle, to speak over the phone.    I reviewed that therapy recommendations were for CIR and I explained average length of stay to be about 2 weeks (dependent upon progress).  I reviewed that typically goals are for at least 24/7 supervision and we discussed some of the methods family has used to make sure patient has stayed safe despite limited mobility and increased fall risk.  I discussed differences between SNF and CIR as far as intensity of therapy and medical oversight, advising that it's impossible for me to say what an ELOS at a SNF would be.  I did reiterate to both Nancy and Danielle that [employer] requires prior authorization and if we move forward with CIR admission, [facility] would not be able to discharge from CIR to a SNF for further rehab or custodial care.    They would like to start insurance authorization and will discuss with family tonight to confirm discharge plans.     , PT, DPT Admissions Coordinator [phone] 11/21/20  1:37 PM  

## 2020-11-21 NOTE — Progress Notes (Signed)
STROKE TEAM PROGRESS NOTE   INTERVAL HISTORY Patient's niece is at the bedside.  She remains globally aphasic with and speak occasional few words and short sentences but will not follow any commands.  Neurological exam is unchanged.  Vital signs are stable.  She has transfer orders to neuro progressive care unit but no beds available.  Therapy teams recommended inpatient rehab   OBJECTIVE Vitals:   11/21/20 1134 11/21/20 1200 11/21/20 1300 11/21/20 1400  BP:  (!) 148/73 (!) 147/69 (!) 162/74  Pulse:  75 83 74  Resp:  (!) 23 (!) 21 14  Temp: 98.5 F (36.9 C)     TempSrc: Oral     SpO2:  97% 97% 95%  Weight:        CBC:  Recent Labs  Lab 11/19/20 1401 11/19/20 1422 11/20/20 0446  WBC 6.9  --  5.8  NEUTROABS 5.2  --   --   HGB 12.5 12.2 10.5*  HCT 36.7 36.0 30.8*  MCV 94.8  --  94.8  PLT 227  --  124    Basic Metabolic Panel:  Recent Labs  Lab 11/19/20 1401 11/19/20 1422 11/20/20 0446  NA 135 137 137  K 3.6 3.6 3.4*  CL 99 99 104  CO2 28  --  27  GLUCOSE 105* 102* 105*  BUN '20 21 14  ' CREATININE 1.13* 1.10* 1.06*  CALCIUM 9.4  --  8.5*    Lipid Panel:     Component Value Date/Time   CHOL 205 (H) 11/20/2020 0446   TRIG 91 11/20/2020 0446   HDL 48 11/20/2020 0446   CHOLHDL 4.3 11/20/2020 0446   VLDL 18 11/20/2020 0446   LDLCALC 139 (H) 11/20/2020 0446   HgbA1c:  Lab Results  Component Value Date   HGBA1C 5.9 (H) 11/20/2020   Urine Drug Screen:     Component Value Date/Time   LABOPIA NONE DETECTED 11/19/2020 1831   COCAINSCRNUR NONE DETECTED 11/19/2020 1831   LABBENZ NONE DETECTED 11/19/2020 1831   AMPHETMU NONE DETECTED 11/19/2020 1831   THCU NONE DETECTED 11/19/2020 1831   LABBARB NONE DETECTED 11/19/2020 1831    Alcohol Level     Component Value Date/Time   ETH <10 11/19/2020 1401    IMAGING   CT HEAD WO CONTRAST  Result Date: 11/19/2020 CLINICAL DATA:  Stroke, follow up Left M2 occlusion status post M1-M2 stenting. EXAM: CT HEAD  WITHOUT CONTRAST TECHNIQUE: Contiguous axial images were obtained from the base of the skull through the vertex without intravenous contrast. COMPARISON:  Same day CT head. FINDINGS: Motion limited study.  Within this limitation: Brain: Similar mild hypoattenuation and possible loss of grey/white differentiation in the inferior left insula with possibly slightly increased hypoattenuation in the anterior left temporal lobe, although streak artifact limits evaluation. No evidence of acute hemorrhage, hydrocephalus, mass lesion, midline shift, or visible extra-axial collection. Vascular: Status post endovascular stenting of the left MCA. Residual contrast in the vasculature. Skull: No acute fracture. Sinuses/Orbits: Mild paranasal sinus mucosal thickening. No acute renal findings. Other: No mastoid effusions. IMPRESSION: 1. Status post endovascular stenting of the left MCA. Possibly slight increase in edema/hypoattenuation in the anterior left temporal lobe, although streak limits evaluation in this region. If the patient is able, an MRI could better evaluate for acute infarct. 2. No evidence of acute hemorrhage. Electronically Signed   By: Margaretha Sheffield M.D.   On: 11/19/2020 20:06   MR ANGIO HEAD WO CONTRAST  Result Date: 11/20/2020 CLINICAL DATA:  Follow-up  examination for acute stroke, left M2 occlusion, status post thrombectomy and stenting. EXAM: MRI HEAD WITHOUT CONTRAST MRA HEAD WITHOUT CONTRAST TECHNIQUE: Multiplanar, multi-echo pulse sequences of the brain and surrounding structures were acquired without intravenous contrast. Angiographic images of the Circle of Willis were acquired using MRA technique without intravenous contrast. COMPARISON:  Prior CTs from earlier the same day. FINDINGS: MRI HEAD FINDINGS Brain: Generalized age-related cerebral atrophy. Mild chronic microvascular ischemic disease noted involving the periventricular white matter. Remote lacunar infarct present at the posterior right  lentiform nucleus/internal capsule. Few small remote bilateral cerebellar infarcts. Patchy restricted diffusion seen involving the left parietal and temporal lobes, consistent with acute left MCA distribution infarct (series 5, images 83, 74). Involvement is primarily cortical in nature, although there are some patchy subcortical involvement. Minimal associated petechial hemorrhage without frank intraparenchymal hematoma (series 14, image 33). No significant regional mass effect. No other evidence for acute or subacute ischemia. Gray-white matter differentiation otherwise maintained. No other acute or chronic intracranial hemorrhage elsewhere within the brain. No mass lesion, midline shift or mass effect. No hydrocephalus or extra-axial fluid collection. Pituitary gland suprasellar region normal. Midline structures intact. Vascular: Major intracranial vascular flow voids are maintained. Susceptibility artifact related to stenting noted at the left M1/M2 segments. Skull and upper cervical spine: Craniocervical junction within normal limits. Bone marrow signal intensity normal. No scalp soft tissue abnormality. Sinuses/Orbits: Globes and orbital soft tissues demonstrate no acute finding. Axial myopia noted. Scattered mucosal thickening noted throughout the paranasal sinuses. No air-fluid levels to suggest acute sinusitis. Moderate right with small left mastoid effusions. Visualized nasopharynx unremarkable. Other: 7 mm T2 hyperintense lesion seen within the right parotid gland (series 17, image 17), indeterminate. MRA HEAD FINDINGS Anterior circulation: Examination degraded by my a shin artifact. Distal cervical ICAs remain widely patent antegrade flow. Petrous segments widely patent. Atheromatous irregularity throughout the carotid siphons without high-grade stenosis. A1 segments, anterior communicating artery complex common anterior cerebral arteries remain patent without definite stenosis. Right M1 segment and  distal right MCA branches well perfused and widely patent. Attenuated but patent flow within the proximal left M1 segment. Susceptibility artifact related to a vascular stent spanning the left M1-M2 segments. Flow through the stent itself is not assessed by MRA. Markedly attenuated flow seen beyond the stent within the distal left MCA branches. Posterior circulation: Both vertebral arteries remain patent to the vertebrobasilar junction. Both PICA remain patent. Basilar diminutive but patent to its distal aspect without stenosis. Superior cerebral arteries patent bilaterally. Fetal type origin of both PCAs, both of which remain patent to their distal aspects. Anatomic variants: Fetal type origin of the PCAs with overall diminutive vertebrobasilar system. No intracranial aneurysm. IMPRESSION: MRI HEAD IMPRESSION: 1. Multifocal acute ischemic left MCA distribution infarcts involving the left parietal and temporal lobes. Minimal associated petechial hemorrhage without frank intraparenchymal hematoma or mass effect. 2. No other acute intracranial abnormality. 3. Underlying age-related cerebral atrophy with mild chronic small vessel ischemic disease, with remote lacunar infarcts involving the right lentiform nucleus/internal capsule, and bilateral cerebellar hemispheres. MRA HEAD IMPRESSION: 1. Technically limited exam due to motion artifact. 2. Interval placement of a vascular stent spanning the left M1-M2 segments. Flow within the stent itself is not assessed by MRA, obscured by susceptibility artifact. Markedly attenuated and reduced flow seen beyond the stent within the left MCA branches distally, raising the possibility for intra stent stenosis or other pathology. Finding could be further assessed with dedicated CTA or repeat arteriogram as warranted. 3. Otherwise stable  intracranial MRA, with no other new or progressive finding. 4. Fetal type origin of the PCAs with overall diminutive vertebrobasilar system.  Electronically Signed   By: Jeannine Boga M.D.   On: 11/20/2020 02:32   MR BRAIN WO CONTRAST  Result Date: 11/20/2020 CLINICAL DATA:  Follow-up examination for acute stroke, left M2 occlusion, status post thrombectomy and stenting. EXAM: MRI HEAD WITHOUT CONTRAST MRA HEAD WITHOUT CONTRAST TECHNIQUE: Multiplanar, multi-echo pulse sequences of the brain and surrounding structures were acquired without intravenous contrast. Angiographic images of the Circle of Willis were acquired using MRA technique without intravenous contrast. COMPARISON:  Prior CTs from earlier the same day. FINDINGS: MRI HEAD FINDINGS Brain: Generalized age-related cerebral atrophy. Mild chronic microvascular ischemic disease noted involving the periventricular white matter. Remote lacunar infarct present at the posterior right lentiform nucleus/internal capsule. Few small remote bilateral cerebellar infarcts. Patchy restricted diffusion seen involving the left parietal and temporal lobes, consistent with acute left MCA distribution infarct (series 5, images 83, 74). Involvement is primarily cortical in nature, although there are some patchy subcortical involvement. Minimal associated petechial hemorrhage without frank intraparenchymal hematoma (series 14, image 33). No significant regional mass effect. No other evidence for acute or subacute ischemia. Gray-white matter differentiation otherwise maintained. No other acute or chronic intracranial hemorrhage elsewhere within the brain. No mass lesion, midline shift or mass effect. No hydrocephalus or extra-axial fluid collection. Pituitary gland suprasellar region normal. Midline structures intact. Vascular: Major intracranial vascular flow voids are maintained. Susceptibility artifact related to stenting noted at the left M1/M2 segments. Skull and upper cervical spine: Craniocervical junction within normal limits. Bone marrow signal intensity normal. No scalp soft tissue abnormality.  Sinuses/Orbits: Globes and orbital soft tissues demonstrate no acute finding. Axial myopia noted. Scattered mucosal thickening noted throughout the paranasal sinuses. No air-fluid levels to suggest acute sinusitis. Moderate right with small left mastoid effusions. Visualized nasopharynx unremarkable. Other: 7 mm T2 hyperintense lesion seen within the right parotid gland (series 17, image 17), indeterminate. MRA HEAD FINDINGS Anterior circulation: Examination degraded by my a shin artifact. Distal cervical ICAs remain widely patent antegrade flow. Petrous segments widely patent. Atheromatous irregularity throughout the carotid siphons without high-grade stenosis. A1 segments, anterior communicating artery complex common anterior cerebral arteries remain patent without definite stenosis. Right M1 segment and distal right MCA branches well perfused and widely patent. Attenuated but patent flow within the proximal left M1 segment. Susceptibility artifact related to a vascular stent spanning the left M1-M2 segments. Flow through the stent itself is not assessed by MRA. Markedly attenuated flow seen beyond the stent within the distal left MCA branches. Posterior circulation: Both vertebral arteries remain patent to the vertebrobasilar junction. Both PICA remain patent. Basilar diminutive but patent to its distal aspect without stenosis. Superior cerebral arteries patent bilaterally. Fetal type origin of both PCAs, both of which remain patent to their distal aspects. Anatomic variants: Fetal type origin of the PCAs with overall diminutive vertebrobasilar system. No intracranial aneurysm. IMPRESSION: MRI HEAD IMPRESSION: 1. Multifocal acute ischemic left MCA distribution infarcts involving the left parietal and temporal lobes. Minimal associated petechial hemorrhage without frank intraparenchymal hematoma or mass effect. 2. No other acute intracranial abnormality. 3. Underlying age-related cerebral atrophy with mild chronic  small vessel ischemic disease, with remote lacunar infarcts involving the right lentiform nucleus/internal capsule, and bilateral cerebellar hemispheres. MRA HEAD IMPRESSION: 1. Technically limited exam due to motion artifact. 2. Interval placement of a vascular stent spanning the left M1-M2 segments. Flow within the stent itself  is not assessed by MRA, obscured by susceptibility artifact. Markedly attenuated and reduced flow seen beyond the stent within the left MCA branches distally, raising the possibility for intra stent stenosis or other pathology. Finding could be further assessed with dedicated CTA or repeat arteriogram as warranted. 3. Otherwise stable intracranial MRA, with no other new or progressive finding. 4. Fetal type origin of the PCAs with overall diminutive vertebrobasilar system. Electronically Signed   By: Jeannine Boga M.D.   On: 11/20/2020 02:32   IR Intra Cran Stent  Result Date: 11/20/2020 INDICATION: 87 year old female with past medical history significant essential hypertension, severe hearing impairment, breast cancer s/p mastectomy, thoracolumbar scoliosis, and hypothyroidism. She presents with global aphasia, NIHSS 6. Her last known well was 2:18 p.m. on 11/18/2020. Her baseline modified Rankin scale is 1. Head CT showed a small left MCA territory infarct (ASPECTS 8). CT angiogram of the head and neck showed a left M2/MCA occlusion. CT perfusion showed a 5 mL core infarct with 44 mL penumbra. She was taken to our service for a diagnostic cerebral angiogram and mechanical thrombectomy. EXAM: ULTRASOUND-GUIDED VASCULAR ACCESS DIAGNOSTIC CEREBRAL ANGIOGRAM MECHANICAL THROMBECTOMY INTRACRANIAL STENTING FLAT PANEL HEAD CT COMPARISON:  CT/CT angiogram of the head and neck November 19, 2020 MEDICATIONS: Intravenous cangrelor bolus and drip ANESTHESIA/SEDATION: The procedure was performed under general anesthesia. CONTRAST:  130 mL of Omnipaque 240 milligram/mL FLUOROSCOPY TIME:   Fluoroscopy Time: 27 minutes 12 seconds (752 mGy). COMPLICATIONS: None immediate. TECHNIQUE: Informed written consent was obtained from the patient's niece after a thorough discussion of the procedural risks, benefits and alternatives. All questions were addressed. Maximal Sterile Barrier Technique was utilized including caps, mask, sterile gowns, sterile gloves, sterile drape, hand hygiene and skin antiseptic. A timeout was performed prior to the initiation of the procedure. The left groin was prepped and draped in the usual sterile fashion. Using a micropuncture kit and the modified Seldinger technique, access was gained to the left common femoral artery and an 8 French sheath was placed. Real-time ultrasound guidance was utilized for vascular access including the acquisition of a permanent ultrasound image documenting patency of the accessed vessel. Under fluoroscopy, an 8 [place] Walrus balloon guide catheter was navigated over a 6 [place] Berenstein 2 catheter and a 0.035" Terumo Glidewire into the aortic arch. The catheter was placed into the left common carotid artery and then advanced into the left internal carotid artery. The inner catheter was removed. Frontal and lateral angiograms of the head were obtained. FINDINGS: 1. Atherosclerotic changes are noted in the left common femoral artery which has adequate caliber for vascular access. 2. Severe stenosis at the distal left M1/MCA with occlusion of the anterior temporal branch which shows mildly delayed retrograde opacification. There is low distal opacification of the remainder of the left MCA vascular tree. PROCEDURE: Under biplane roadmap, a zoom 71 aspiration catheter was navigated over an Aristotle 14 microguidewire into the cavernous segment of the right ICA. The aspiration catheter was then advanced to the level of occlusion/near occlusion and connected to an aspiration pump. Continuous aspiration was performed for 3 minutes. The guide catheter balloon  was inflated and the catheter was connected to a VacLok syringe. The aspiration catheter was subsequently removed under constant aspiration. The guide catheter was aspirated for debris. Left ICA angiograms with frontal and lateral views of the head showed improved anterograde flow the left MCA vascular tree with persistent severe stenosis. Under biplane roadmap, a zoom 71 aspiration catheter was navigated over an Aristotle 14  microguidewire into the cavernous segment of the right ICA. The aspiration catheter was then advanced to the level of occlusion/near occlusion and connected to an aspiration pump. Continuous aspiration was performed for 3 minutes. The guide catheter balloon was inflated and the catheter was connected to a VacLok syringe. The aspiration catheter was subsequently removed under constant aspiration. The guide catheter was aspirated for debris. Left ICA angiograms with frontal and lateral views of the head showed persistent severe stenosis. Patient was loaded on cangrelor and drip was initiated. Under biplane roadmap, a zoom 71 aspiration catheter was navigated over a phenom 21 microcatheter and a synchro support microguidewire into the cavernous segment of the right ICA. The microcatheter was then navigated over the wire into the left M2/MCA posterior division branch. Then, a 4.5 x 30 mm neural form atlas stent was deployed spanning the left M1 and M2/MCA posterior division branch. Left internal carotid artery angiogram showed adequate stent position across the left MCA stenosis with mild residual stenosis. Delayed left internal carotid artery angiograms with frontal and lateral views of the head showed further improvement of the degree of stenosis with improvement of the anterograde flow and no evidence of in stent thrombosis. Flat panel CT of the head was obtained and post processed in a separate workstation with concurrent attending physician supervision. Selected images were sent to PACS. No  evidence of hemorrhagic complication. A left common femoral artery angiogram was obtained with left anterior oblique views. The puncture is at the common femoral artery which has adequate caliber for closure device. The femoral sheath was exchanged over the wire for a Perclose pro style which was utilized for access closure. Immediate hemostasis was achieved. 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 Rescue stenting performed with minimal residual stenosis. No hemorrhagic complication postprocedural flat panel head CT. PLAN: Continue on cangrelor drip until head CT is obtained within 3-1/2 hours. At this point, patient may be loaded on ticagrelor and aspirin if CT shows no evidence of hemorrhagic complication. Electronically Signed   By: Pedro Earls M.D.   On: 11/20/2020 13:13   IR CT Head Ltd  Result Date: 11/20/2020 INDICATION: 87 year old female with past medical history significant essential hypertension, severe hearing impairment, breast cancer s/p mastectomy, thoracolumbar scoliosis, and hypothyroidism. She presents with global aphasia, NIHSS 6. Her last known well was 2:18 p.m. on 11/18/2020. Her baseline modified Rankin scale is 1. Head CT showed a small left MCA territory infarct (ASPECTS 8). CT angiogram of the head and neck showed a left M2/MCA occlusion. CT perfusion showed a 5 mL core infarct with 44 mL penumbra. She was taken to our service for a diagnostic cerebral angiogram and mechanical thrombectomy. EXAM: ULTRASOUND-GUIDED VASCULAR ACCESS DIAGNOSTIC CEREBRAL ANGIOGRAM MECHANICAL THROMBECTOMY INTRACRANIAL STENTING FLAT PANEL HEAD CT COMPARISON:  CT/CT angiogram of the head and neck November 19, 2020 MEDICATIONS: Intravenous cangrelor bolus and drip ANESTHESIA/SEDATION: The procedure was performed under general anesthesia. CONTRAST:  130 mL of Omnipaque  240 milligram/mL FLUOROSCOPY TIME:  Fluoroscopy Time: 27 minutes 12 seconds (752 mGy). COMPLICATIONS: None immediate. TECHNIQUE: Informed written consent was obtained from the patient's niece after a thorough discussion of the procedural risks, benefits and alternatives. All questions were addressed. Maximal Sterile Barrier Technique was utilized including caps, mask, sterile gowns, sterile gloves, sterile drape, hand hygiene and skin antiseptic. A timeout was performed prior to the initiation of the procedure. The left groin  was prepped and draped in the usual sterile fashion. Using a micropuncture kit and the modified Seldinger technique, access was gained to the left common femoral artery and an 8 French sheath was placed. Real-time ultrasound guidance was utilized for vascular access including the acquisition of a permanent ultrasound image documenting patency of the accessed vessel. Under fluoroscopy, an 8 [place] Walrus balloon guide catheter was navigated over a 6 [place] Berenstein 2 catheter and a 0.035" Terumo Glidewire into the aortic arch. The catheter was placed into the left common carotid artery and then advanced into the left internal carotid artery. The inner catheter was removed. Frontal and lateral angiograms of the head were obtained. FINDINGS: 1. Atherosclerotic changes are noted in the left common femoral artery which has adequate caliber for vascular access. 2. Severe stenosis at the distal left M1/MCA with occlusion of the anterior temporal branch which shows mildly delayed retrograde opacification. There is low distal opacification of the remainder of the left MCA vascular tree. PROCEDURE: Under biplane roadmap, a zoom 71 aspiration catheter was navigated over an Aristotle 14 microguidewire into the cavernous segment of the right ICA. The aspiration catheter was then advanced to the level of occlusion/near occlusion and connected to an aspiration pump. Continuous aspiration was performed for 3  minutes. The guide catheter balloon was inflated and the catheter was connected to a VacLok syringe. The aspiration catheter was subsequently removed under constant aspiration. The guide catheter was aspirated for debris. Left ICA angiograms with frontal and lateral views of the head showed improved anterograde flow the left MCA vascular tree with persistent severe stenosis. Under biplane roadmap, a zoom 71 aspiration catheter was navigated over an Aristotle 14 microguidewire into the cavernous segment of the right ICA. The aspiration catheter was then advanced to the level of occlusion/near occlusion and connected to an aspiration pump. Continuous aspiration was performed for 3 minutes. The guide catheter balloon was inflated and the catheter was connected to a VacLok syringe. The aspiration catheter was subsequently removed under constant aspiration. The guide catheter was aspirated for debris. Left ICA angiograms with frontal and lateral views of the head showed persistent severe stenosis. Patient was loaded on cangrelor and drip was initiated. Under biplane roadmap, a zoom 71 aspiration catheter was navigated over a phenom 21 microcatheter and a synchro support microguidewire into the cavernous segment of the right ICA. The microcatheter was then navigated over the wire into the left M2/MCA posterior division branch. Then, a 4.5 x 30 mm neural form atlas stent was deployed spanning the left M1 and M2/MCA posterior division branch. Left internal carotid artery angiogram showed adequate stent position across the left MCA stenosis with mild residual stenosis. Delayed left internal carotid artery angiograms with frontal and lateral views of the head showed further improvement of the degree of stenosis with improvement of the anterograde flow and no evidence of in stent thrombosis. Flat panel CT of the head was obtained and post processed in a separate workstation with concurrent attending physician supervision.  Selected images were sent to PACS. No evidence of hemorrhagic complication. A left common femoral artery angiogram was obtained with left anterior oblique views. The puncture is at the common femoral artery which has adequate caliber for closure device. The femoral sheath was exchanged over the wire for a Perclose pro style which was utilized for access closure. Immediate hemostasis was achieved. 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  Rescue stenting performed with minimal residual stenosis. No hemorrhagic complication postprocedural flat panel head CT. PLAN: Continue on cangrelor drip until head CT is obtained within 3-1/2 hours. At this point, patient may be loaded on ticagrelor and aspirin if CT shows no evidence of hemorrhagic complication. Electronically Signed   By: Pedro Earls M.D.   On: 11/20/2020 13:13   IR US Guide Vasc Access Left  Result Date: 11/20/2020 INDICATION: 87 year old female with past medical history significant essential hypertension, severe hearing impairment, breast cancer s/p mastectomy, thoracolumbar scoliosis, and hypothyroidism. She presents with global aphasia, NIHSS 6. Her last known well was 2:18 p.m. on 11/18/2020. Her baseline modified Rankin scale is 1. Head CT showed a small left MCA territory infarct (ASPECTS 8). CT angiogram of the head and neck showed a left M2/MCA occlusion. CT perfusion showed a 5 mL core infarct with 44 mL penumbra. She was taken to our service for a diagnostic cerebral angiogram and mechanical thrombectomy. EXAM: ULTRASOUND-GUIDED VASCULAR ACCESS DIAGNOSTIC CEREBRAL ANGIOGRAM MECHANICAL THROMBECTOMY INTRACRANIAL STENTING FLAT PANEL HEAD CT COMPARISON:  CT/CT angiogram of the head and neck November 19, 2020 MEDICATIONS: Intravenous cangrelor bolus and drip ANESTHESIA/SEDATION: The procedure was performed under  general anesthesia. CONTRAST:  130 mL of Omnipaque 240 milligram/mL FLUOROSCOPY TIME:  Fluoroscopy Time: 27 minutes 12 seconds (752 mGy). COMPLICATIONS: None immediate. TECHNIQUE: Informed written consent was obtained from the patient's niece after a thorough discussion of the procedural risks, benefits and alternatives. All questions were addressed. Maximal Sterile Barrier Technique was utilized including caps, mask, sterile gowns, sterile gloves, sterile drape, hand hygiene and skin antiseptic. A timeout was performed prior to the initiation of the procedure. The left groin was prepped and draped in the usual sterile fashion. Using a micropuncture kit and the modified Seldinger technique, access was gained to the left common femoral artery and an 8 French sheath was placed. Real-time ultrasound guidance was utilized for vascular access including the acquisition of a permanent ultrasound image documenting patency of the accessed vessel. Under fluoroscopy, an 8 [place] Walrus balloon guide catheter was navigated over a 6 [place] Berenstein 2 catheter and a 0.035" Terumo Glidewire into the aortic arch. The catheter was placed into the left common carotid artery and then advanced into the left internal carotid artery. The inner catheter was removed. Frontal and lateral angiograms of the head were obtained. FINDINGS: 1. Atherosclerotic changes are noted in the left common femoral artery which has adequate caliber for vascular access. 2. Severe stenosis at the distal left M1/MCA with occlusion of the anterior temporal branch which shows mildly delayed retrograde opacification. There is low distal opacification of the remainder of the left MCA vascular tree. PROCEDURE: Under biplane roadmap, a zoom 71 aspiration catheter was navigated over an Aristotle 14 microguidewire into the cavernous segment of the right ICA. The aspiration catheter was then advanced to the level of occlusion/near occlusion and connected to an  aspiration pump. Continuous aspiration was performed for 3 minutes. The guide catheter balloon was inflated and the catheter was connected to a VacLok syringe. The aspiration catheter was subsequently removed under constant aspiration. The guide catheter was aspirated for debris. Left ICA angiograms with frontal and lateral views of the head showed improved anterograde flow the left MCA vascular tree with persistent severe stenosis. Under biplane roadmap, a zoom 71 aspiration catheter was navigated over an Aristotle 14 microguidewire into the cavernous segment of the right ICA. The aspiration catheter was then advanced to the level of occlusion/near  occlusion and connected to an aspiration pump. Continuous aspiration was performed for 3 minutes. The guide catheter balloon was inflated and the catheter was connected to a VacLok syringe. The aspiration catheter was subsequently removed under constant aspiration. The guide catheter was aspirated for debris. Left ICA angiograms with frontal and lateral views of the head showed persistent severe stenosis. Patient was loaded on cangrelor and drip was initiated. Under biplane roadmap, a zoom 71 aspiration catheter was navigated over a phenom 21 microcatheter and a synchro support microguidewire into the cavernous segment of the right ICA. The microcatheter was then navigated over the wire into the left M2/MCA posterior division branch. Then, a 4.5 x 30 mm neural form atlas stent was deployed spanning the left M1 and M2/MCA posterior division branch. Left internal carotid artery angiogram showed adequate stent position across the left MCA stenosis with mild residual stenosis. Delayed left internal carotid artery angiograms with frontal and lateral views of the head showed further improvement of the degree of stenosis with improvement of the anterograde flow and no evidence of in stent thrombosis. Flat panel CT of the head was obtained and post processed in a separate  workstation with concurrent attending physician supervision. Selected images were sent to PACS. No evidence of hemorrhagic complication. A left common femoral artery angiogram was obtained with left anterior oblique views. The puncture is at the common femoral artery which has adequate caliber for closure device. The femoral sheath was exchanged over the wire for a Perclose pro style which was utilized for access closure. Immediate hemostasis was achieved. 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 Rescue stenting performed with minimal residual stenosis. No hemorrhagic complication postprocedural flat panel head CT. PLAN: Continue on cangrelor drip until head CT is obtained within 3-1/2 hours. At this point, patient may be loaded on ticagrelor and aspirin if CT shows no evidence of hemorrhagic complication. Electronically Signed   By: Pedro Earls M.D.   On: 11/20/2020 13:13   ECHOCARDIOGRAM COMPLETE  Result Date: 11/20/2020    ECHOCARDIOGRAM REPORT   Patient Name:   Maria Dyer Date of Exam: 11/20/2020 Medical Rec #:  [id number]       Height: Accession #:    [id number]      Weight:       140.7 lb Date of Birth:  [birth year]       BSA:          1.597 m Patient Age:    40 years        BP:           153/116 mmHg Patient Gender: F               HR:           87 bpm. Exam Location:  Inpatient Procedure: 2D Echo, Color Doppler and Cardiac Doppler Indications:    Stroke i63.9  History:        Patient has no prior history of Echocardiogram examinations.                 Risk Factors:Hypertension.  Sonographer:    Raquel Sarna Senior RDCS Referring Phys: [id number] Rikki Spearing  Sonographer Comments: Difficult study, patient is confused, pulling on clothes and wires, exam truncated at the end due to patient pushing probe away. IMPRESSIONS  1. Left ventricular ejection fraction, by  estimation, is 60 to 65%. The left ventricle  has normal function. The left ventricle has no regional wall motion abnormalities. Left ventricular diastolic parameters are indeterminate. Elevated left ventricular end-diastolic pressure.  2. Right ventricular systolic function is normal. The right ventricular size is normal.  3. The mitral valve is grossly normal. Trivial mitral valve regurgitation. Moderate mitral annular calcification.  4. The aortic valve is grossly normal. Aortic valve regurgitation is not visualized. FINDINGS  Left Ventricle: Left ventricular ejection fraction, by estimation, is 60 to 65%. The left ventricle has normal function. The left ventricle has no regional wall motion abnormalities. The left ventricular internal cavity size was normal in size. There is  no left ventricular hypertrophy. Left ventricular diastolic parameters are indeterminate. Elevated left ventricular end-diastolic pressure. Right Ventricle: The right ventricular size is normal. Right vetricular wall thickness was not well visualized. Right ventricular systolic function is normal. Left Atrium: Left atrial size was normal in size. Right Atrium: Right atrial size was normal in size. Pericardium: There is no evidence of pericardial effusion. Mitral Valve: The mitral valve is grossly normal. Moderate mitral annular calcification. Trivial mitral valve regurgitation. Tricuspid Valve: The tricuspid valve is not well visualized. Tricuspid valve regurgitation is not demonstrated. Aortic Valve: The aortic valve is grossly normal. Aortic valve regurgitation is not visualized. Pulmonic Valve: The pulmonic valve was grossly normal. Pulmonic valve regurgitation is not visualized. Aorta: The aortic root and ascending aorta are structurally normal, with no evidence of dilitation. IAS/Shunts: The atrial septum is grossly normal.  LEFT VENTRICLE PLAX 2D LVIDd:         4.70 cm  Diastology LVIDs:         2.60 cm  LV e' medial:    7.62 cm/s LV  PW:         0.80 cm  LV E/e' medial:  15.1 LV IVS:        0.50 cm  LV e' lateral:   7.07 cm/s LVOT diam:     1.70 cm  LV E/e' lateral: 16.3 LV SV:         45 LV SV Index:   28 LVOT Area:     2.27 cm  RIGHT VENTRICLE TAPSE (M-mode): 2.0 cm LEFT ATRIUM             Index       RIGHT ATRIUM           Index LA diam:        3.80 cm 2.38 cm/m  RA Area:     12.90 cm LA Vol (A2C):   34.5 ml 21.60 ml/m RA Volume:   30.50 ml  19.10 ml/m LA Vol (A4C):   46.4 ml 29.06 ml/m LA Biplane Vol: 43.7 ml 27.37 ml/m  AORTIC VALVE LVOT Vmax:   84.20 cm/s LVOT Vmean:  58.900 cm/s LVOT VTI:    0.199 m  AORTA Ao Root diam: 2.60 cm MITRAL VALVE MV Area (PHT): 5.54 cm     SHUNTS MV Decel Time: 137 msec     Systemic VTI:  0.20 m MV E velocity: 115.00 cm/s  Systemic Diam: 1.70 cm MV A velocity: 103.00 cm/s MV E/A ratio:  1.12 Mertie Moores MD Electronically signed by Mertie Moores MD Signature Date/Time: 11/20/2020/11:45:17 AM    Final    IR PERCUTANEOUS ART THROMBECTOMY/INFUSION INTRACRANIAL INC DIAG ANGIO  Result Date: 11/20/2020 INDICATION: 87 year old female with past medical history significant essential hypertension, severe hearing impairment, breast cancer s/p mastectomy, thoracolumbar scoliosis, and hypothyroidism. She presents with global aphasia, NIHSS 6. Her last  known well was 2:18 p.m. on 11/18/2020. Her baseline modified Rankin scale is 1. Head CT showed a small left MCA territory infarct (ASPECTS 8). CT angiogram of the head and neck showed a left M2/MCA occlusion. CT perfusion showed a 5 mL core infarct with 44 mL penumbra. She was taken to our service for a diagnostic cerebral angiogram and mechanical thrombectomy. EXAM: ULTRASOUND-GUIDED VASCULAR ACCESS DIAGNOSTIC CEREBRAL ANGIOGRAM MECHANICAL THROMBECTOMY INTRACRANIAL STENTING FLAT PANEL HEAD CT COMPARISON:  CT/CT angiogram of the head and neck November 19, 2020 MEDICATIONS: Intravenous cangrelor bolus and drip ANESTHESIA/SEDATION: The procedure was performed under  general anesthesia. CONTRAST:  130 mL of Omnipaque 240 milligram/mL FLUOROSCOPY TIME:  Fluoroscopy Time: 27 minutes 12 seconds (752 mGy). COMPLICATIONS: None immediate. TECHNIQUE: Informed written consent was obtained from the patient's niece after a thorough discussion of the procedural risks, benefits and alternatives. All questions were addressed. Maximal Sterile Barrier Technique was utilized including caps, mask, sterile gowns, sterile gloves, sterile drape, hand hygiene and skin antiseptic. A timeout was performed prior to the initiation of the procedure. The left groin was prepped and draped in the usual sterile fashion. Using a micropuncture kit and the modified Seldinger technique, access was gained to the left common femoral artery and an 8 French sheath was placed. Real-time ultrasound guidance was utilized for vascular access including the acquisition of a permanent ultrasound image documenting patency of the accessed vessel. Under fluoroscopy, an 8 [place] Walrus balloon guide catheter was navigated over a 6 [place] Berenstein 2 catheter and a 0.035" Terumo Glidewire into the aortic arch. The catheter was placed into the left common carotid artery and then advanced into the left internal carotid artery. The inner catheter was removed. Frontal and lateral angiograms of the head were obtained. FINDINGS: 1. Atherosclerotic changes are noted in the left common femoral artery which has adequate caliber for vascular access. 2. Severe stenosis at the distal left M1/MCA with occlusion of the anterior temporal branch which shows mildly delayed retrograde opacification. There is low distal opacification of the remainder of the left MCA vascular tree. PROCEDURE: Under biplane roadmap, a zoom 71 aspiration catheter was navigated over an Aristotle 14 microguidewire into the cavernous segment of the right ICA. The aspiration catheter was then advanced to the level of occlusion/near occlusion and connected to an  aspiration pump. Continuous aspiration was performed for 3 minutes. The guide catheter balloon was inflated and the catheter was connected to a VacLok syringe. The aspiration catheter was subsequently removed under constant aspiration. The guide catheter was aspirated for debris. Left ICA angiograms with frontal and lateral views of the head showed improved anterograde flow the left MCA vascular tree with persistent severe stenosis. Under biplane roadmap, a zoom 71 aspiration catheter was navigated over an Aristotle 14 microguidewire into the cavernous segment of the right ICA. The aspiration catheter was then advanced to the level of occlusion/near occlusion and connected to an aspiration pump. Continuous aspiration was performed for 3 minutes. The guide catheter balloon was inflated and the catheter was connected to a VacLok syringe. The aspiration catheter was subsequently removed under constant aspiration. The guide catheter was aspirated for debris. Left ICA angiograms with frontal and lateral views of the head showed persistent severe stenosis. Patient was loaded on cangrelor and drip was initiated. Under biplane roadmap, a zoom 71 aspiration catheter was navigated over a phenom 21 microcatheter and a synchro support microguidewire into the cavernous segment of the right ICA. The microcatheter was then navigated over the wire  into the left M2/MCA posterior division branch. Then, a 4.5 x 30 mm neural form atlas stent was deployed spanning the left M1 and M2/MCA posterior division branch. Left internal carotid artery angiogram showed adequate stent position across the left MCA stenosis with mild residual stenosis. Delayed left internal carotid artery angiograms with frontal and lateral views of the head showed further improvement of the degree of stenosis with improvement of the anterograde flow and no evidence of in stent thrombosis. Flat panel CT of the head was obtained and post processed in a separate  workstation with concurrent attending physician supervision. Selected images were sent to PACS. No evidence of hemorrhagic complication. A left common femoral artery angiogram was obtained with left anterior oblique views. The puncture is at the common femoral artery which has adequate caliber for closure device. The femoral sheath was exchanged over the wire for a Perclose pro style which was utilized for access closure. Immediate hemostasis was achieved. 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 Rescue stenting performed with minimal residual stenosis. No hemorrhagic complication postprocedural flat panel head CT. PLAN: Continue on cangrelor drip until head CT is obtained within 3-1/2 hours. At this point, patient may be loaded on ticagrelor and aspirin if CT shows no evidence of hemorrhagic complication. Electronically Signed   By: Pedro Earls M.D.   On: 11/20/2020 13:13   VAS [place] LOWER EXTREMITY VENOUS (DVT)  Result Date: 11/20/2020  Lower Venous DVT Study Patient Name:  Maria Dyer  Date of Exam:   11/20/2020 Medical Rec #: [id number]        Accession #:    [id number] Date of Birth: [birth year]        Patient Gender: F Patient Age:   21 years Exam Location:  [facility] Procedure:      VAS [place] LOWER EXTREMITY VENOUS (DVT) Referring Phys: Cornelius Moras XU --------------------------------------------------------------------------------  Indications: Embolic stroke.  Limitations: Patient guarding, intolerant to probe pressure, size and depth of vessels. Comparison Study: No prior studies. Performing Technologist: Darlin Coco RDMS, RVT  Examination Guidelines: A complete evaluation includes B-mode imaging, spectral Doppler, color Doppler, and power Doppler as needed of all accessible portions of each vessel. Bilateral testing is considered an integral part of a  complete examination. Limited examinations for reoccurring indications may be performed as noted. The reflux portion of the exam is performed with the patient in reverse Trendelenburg.  +---------+---------------+---------+-----------+----------+---------------+ RIGHT    CompressibilityPhasicitySpontaneityPropertiesThrombus Aging  +---------+---------------+---------+-----------+----------+---------------+ CFV      Full           Yes      Yes                                  +---------+---------------+---------+-----------+----------+---------------+ SFJ      Full                                                         +---------+---------------+---------+-----------+----------+---------------+ FV Prox  Full                                                         +---------+---------------+---------+-----------+----------+---------------+  FV Mid   Full                                                         +---------+---------------+---------+-----------+----------+---------------+ FV DistalFull                                                         +---------+---------------+---------+-----------+----------+---------------+ PFV      Full                                                         +---------+---------------+---------+-----------+----------+---------------+ POP      Full           Yes      Yes                                  +---------+---------------+---------+-----------+----------+---------------+ PTV                                                   Patent by color +---------+---------------+---------+-----------+----------+---------------+ PERO                                                  Patent by color +---------+---------------+---------+-----------+----------+---------------+   +---------+---------------+---------+-----------+----------+-------------------+ LEFT      CompressibilityPhasicitySpontaneityPropertiesThrombus Aging      +---------+---------------+---------+-----------+----------+-------------------+ CFV                     Yes      Yes                                      +---------+---------------+---------+-----------+----------+-------------------+ SFJ      Full                                                             +---------+---------------+---------+-----------+----------+-------------------+ FV Prox  Full                                                             +---------+---------------+---------+-----------+----------+-------------------+ FV Mid   Full                                                             +---------+---------------+---------+-----------+----------+-------------------+  FV Distal                                             Unable to visualize                                                       due to patient                                                            guarding            +---------+---------------+---------+-----------+----------+-------------------+ PFV      Full                                                             +---------+---------------+---------+-----------+----------+-------------------+ POP      Full                                                             +---------+---------------+---------+-----------+----------+-------------------+ PTV      Full           Yes      Yes                                      +---------+---------------+---------+-----------+----------+-------------------+ PERO                    Yes      Yes                  Patent by color     +---------+---------------+---------+-----------+----------+-------------------+     Summary: RIGHT: - There is no evidence of deep vein thrombosis in the lower extremity. However, portions of this examination were limited- see technologist comments above.   - No cystic structure found in the popliteal fossa.  LEFT: - There is no evidence of deep vein thrombosis in the lower extremity. However, portions of this examination were limited- see technologist comments above.  - No cystic structure found in the popliteal fossa.  *See table(s) above for measurements and observations. Electronically signed by Harold Barban MD on 11/20/2020 at 8:08:30 PM.    Final    CT ANGIO HEAD NECK W WO CM W PERF (CODE STROKE)  Result Date: 11/19/2020 CLINICAL DATA:  Neuro deficit, acute, stroke suspected EXAM: CT ANGIOGRAPHY HEAD AND NECK CT PERFUSION BRAIN TECHNIQUE: Multidetector CT imaging of the head and neck was performed using the standard protocol during bolus administration of intravenous contrast. Multiplanar CT image reconstructions and MIPs were obtained to evaluate the vascular anatomy. Carotid stenosis measurements (  when applicable) are obtained utilizing NASCET criteria, using the distal internal carotid diameter as the denominator. Multiphase CT imaging of the brain was performed following IV bolus contrast injection. Subsequent parametric perfusion maps were calculated using RAPID software. CONTRAST:  160m OMNIPAQUE IOHEXOL 350 MG/ML SOLN COMPARISON:  None. FINDINGS: CT HEAD Brain: There is no acute intracranial hemorrhage, mass effect, or edema. There is loss of gray-white differentiation along the inferior insula and likely adjacent temporal lobe. There is no extra-axial fluid collection. Prominence of the ventricles and sulci reflects generalized parenchymal volume loss. Patchy low-attenuation in the supratentorial white matter is nonspecific but may reflect chronic microvascular ischemic changes. There is a small chronic left cerebellar infarct. Vascular: Focal hyperdensity is present along the left M1 MCA. Skull: Calvarium is unremarkable. Sinuses/Orbits: No acute finding. Other: None. Review of the MIP images confirms the above findings CTA NECK Aortic arch: Mild  mixed plaque is present along the aortic arch. There is irregular noncalcified plaque along the superior margin beyond the left subclavian origin. Right carotid system: Patent. Mild calcified plaque at the bifurcation. No stenosis. Left carotid system: Patent. Mild calcified plaque at the bifurcation and proximal internal carotid. No stenosis. Vertebral arteries: Patent. Left vertebral artery slightly dominant. No stenosis. Skeleton: Cervical spine degenerative changes. Vertebral body hemangioma at T1. Other neck: Unremarkable. Upper chest: No apical lung mass. Review of the MIP images confirms the above findings CTA HEAD Anterior circulation: Intracranial internal carotid arteries are patent with mild calcified plaque but no significant stenosis. Proximal left M1 MCA is patent. There is early bifurcation of the MCA with distal M1 proximal M2 occlusion. Partial reconstitution. There is high-grade stenosis or nonocclusive thrombus within anterior temporal branch approximately 5 mm beyond its origin arising just proximal to the occlusion. Right middle and both anterior cerebral arteries are patent. Posterior circulation: Intracranial vertebral arteries are patent with mixed plaque causing mild stenosis. Basilar artery is patent. Major cerebellar artery origins are patent. Bilateral posterior communicating arteries are present with fetal origin of both patent posterior cerebral arteries. Venous sinuses: Patent as allowed by contrast bolus timing. Review of the MIP images confirms the above findings CT Brain Perfusion Findings: CBF (<30%) Volume: 557mPerfusion (Tmax>6.0s) volume: 4468mismatch Volume: 14m30mfarction Location: Left MCA territory IMPRESSION: No acute intracranial hemorrhage. Small acute left MCA territory infarction involving inferior insula and adjacent temporal lobe (ASPECT score is 8). Early bifurcation of the left MCA with distal M1 and proximal M2 occlusion and subsequent partial reconstitution.  High-grade stenosis or nonocclusive thrombus within anterior temporal branch approximately 5 mm beyond its origin just proximal to the occlusion. Perfusion imaging demonstrates 5 mL of core infarction partially corresponding to noncontrast CT abnormality. There is a calculated penumbra of 39 mL in the left MCA territory. No occlusion or hemodynamically significant stenosis in the neck. Initial results were communicated to Dr. BhagCurly Shores3:11 pm on 11/19/2020 by text page via the [facility] system. Electronically Signed   By: PranMacy Mis.   On: 11/19/2020 15:24     Neuro Interventional Radiology - Cerebral Angiogram with Intervention - de MaceRosario Jacks 11/19/2020  5:04 PM Left M2/MCA near occlusion with very delayed contrast penetration. Mechanical thrombectomy performed with direct contact aspiration x2 with improved distal perfusion (TICI 2C). However, there was persistent severe stenosis. Patient was loaded on cangrelor and a 4.5 x 30 mm neuroform atlas stent was deployed spanning the left M1-M2 posterio division with significant improvement of the aterograde flow (TICI3).  Delay angiogram showed no evidence of in stent thrombosis. Flat panel head CT showed no evidence of hemorrhagic complication. The patient tolerated the procedure well without incident or complication and is in stable condition.  PLAN: - Bed rest post left femoral access x 6 hours - Head CT around 7:30 pm. It is important that the patient have the CT done before cangrelor drip ends (8pm). - If no bleed on head CT, stop cangrelor and load ticagrelor 180 mg and ASA 81 mg. Please place NG tube if patient is unable to swallow. - If bleed on head CT, please contact neurology and neuro IR. - SBP 120-140 mmHg (24h post procedure)  ECG - SR rate 71 BPM. (See cardiology reading for complete details)  PHYSICAL EXAM  Temp:  [97.6 F (36.4 C)-98.5 F (36.9 C)] 98.5 F (36.9 C) (08/25 1134) Pulse Rate:  [66-126]  74 (08/25 1400) Resp:  [14-33] 14 (08/25 1400) BP: (98-162)/(48-114) 162/74 (08/25 1400) SpO2:  [94 %-99 %] 95 % (08/25 1400)  General - Well nourished, well developed elderly Caucasian lady, in no apparent distress.  Ophthalmologic - fundi not visualized due to noncooperation.  Cardiovascular - Regular rhythm no murmur or gallop  Neuro - awake, alert, eyes open, global aphasia, not following all simple commands, able to have random words out, mostly nonsensical but did have intermittent meaningful short sentences but not appropriate with situation. Not able to name or repeat or read. No gaze palsy, tracking bilaterally, blinking to visual threat bilaterally. No facial droop. Tongue protrusion not cooperative. Bilateral UEs at least 4/5. Bilaterally LEs at least 3/5. Sensation, coordination and gait not tested.    ASSESSMENT/PLAN Maria Dyer is a 87 y.o. female with history of essential hypertension, severe hearing impairment, breast cancer s/p mastectomy over 25 years ago per great niece, thoracolumbar scoliosis, and hypothyroidism who presented to the ED via EMS from home for evaluation of garbled and dysarthric speech.  She did not receive IV t-PA due to late presentation (>4.5 hours from time of onset).  Stroke: L MCA patchy infarcts - embolic - source cryptogenic CT head - No acute intracranial hemorrhage. Small acute left MCA territory infarction involving inferior insula and adjacent temporal lobe (ASPECT score is 8). CTA H&N - Early bifurcation of the left MCA with distal M1 and proximal M2 occlusion and subsequent partial reconstitution. High-grade stenosis or nonocclusive thrombus within anterior temporal branch approximately 5 mm beyond its origin just proximal to the occlusion.  CTP - 5/44, positive penumbra. IR - left M2 near occlusion -> TICI2c -> Persistent severe stenosis -> stenting -> TICI3 MRI head - Multifocal acute ischemic left MCA distribution infarcts involving  the left parietal and temporal lobes.  MRA head - Interval placement of a vascular stent spanning the left M1-M2 segments. Markedly attenuated and reduced flow seen beyond the stent within the left MCA branches distally, raising the possibility for intra stent stenosis or other pathology. Fetal type origin of the PCAs with overall diminutive vertebrobasilar system. LE venous doppler - limited exam but no evidence of DVT 2D Echo - EF 60-65% Consider loop recorder if embolic work up negative.  Sars Corona Virus 2 - negative LDL - 139 HgbA1c - 5.9 UDS - negative VTE prophylaxis - SCDs No antithrombotic prior to admission, now on aspirin 81 mg daily and Brilinta (ticagrelor) 90 mg bid Patient will be counseled to be compliant with her antithrombotic medications Ongoing aggressive stroke risk factor management Therapy recommendations:  CIR Disposition:  Pending  Hypertension Home BP meds: Cozaar ; HCTZ ; metoprolol Stable SBP 120-140 mmHg (24h post procedure) per IR Long-term BP goal normotensive  Hyperlipidemia Home Lipid lowering medication: none  LDL 139, goal < 70 Put on lipitor 40 Continue statin at discharge  Other Stroke Risk Factors Advanced age  Other Active Problems, Findings, Recommendations and/or Plan Code status - Full code Hypokalemia - 3.4 - supplement AKI - creatinine - 1.13->1.10->1.06 Remote breast cancer s/p Sx Bmet and CBC pending 11/21/20   Continue mobilization out of bed and ongoing therapy consults.  Transfer to neurology floor bed when available.  Long discussion with the patient's niece at the bedside and answered questions about her plan of care.  Discussed with speech therapist.  Greater than 50% time during this 35-minute visit was spent on counseling and coordination of care and discussion with care team.  Antony Contras, MD    To contact Stroke Continuity provider, please refer to [email or web address] After hours, contact General Neurology

## 2020-11-21 NOTE — Progress Notes (Signed)
Initial Nutrition Assessment  DOCUMENTATION CODES:   Not applicable  INTERVENTION:   Ensure Enlive po BID, each supplement provides 350 kcal and 20 grams of protein  Encourage PO intake   NUTRITION DIAGNOSIS:   Inadequate oral intake related to decreased appetite as evidenced by meal completion < 50%.  GOAL:   Patient will meet greater than or equal to 90% of their needs  MONITOR:   PO intake, Supplement acceptance  REASON FOR ASSESSMENT:   Rounds    ASSESSMENT:   Pt with PMH of HTN, severe hearing impairment, breast ca s/p mastectomy, and hypothyroidism who lives at home alone admitted with L MCA stroke.    Pt discussed during ICU rounds and with RN.  Per RN pt only eating bites but is drinking liquids.  Meal Completion: <50%  Spoke with family at bedside. Pt lives alone and completes her own ADLs. She uses a walker at home and family uses a wheelchair when they take her out to lunch. She eats little at home and usually eats a large lunch but is too full for dinner.   Breakfast: nutragrain bar and coffee Lunch: out to eat which could be a grilled cheese/soup, pizza, sandwich She eats all of her lunch Dinner: none    Medications reviewed and include: SSI Labs reviewed: K+ 3.4   NUTRITION - FOCUSED PHYSICAL EXAM:  Flowsheet Row Most Recent Value  Orbital Region No depletion  Upper Arm Region No depletion  Thoracic and Lumbar Region No depletion  Buccal Region No depletion  Temple Region No depletion  Clavicle Bone Region No depletion  Clavicle and Acromion Bone Region No depletion  Scapular Bone Region Unable to assess  Dorsal Hand Moderate depletion  Patellar Region No depletion  Anterior Thigh Region No depletion  Posterior Calf Region No depletion  Edema (RD Assessment) None  Hair Reviewed  Eyes Reviewed  Mouth Reviewed  Skin Reviewed  Nails Reviewed       Diet Order:   Diet Order             Diet regular Room service appropriate? Yes;  Fluid consistency: Thin  Diet effective now                   EDUCATION NEEDS:   Not appropriate for education at this time  Skin:  Skin Assessment: Skin Integrity Issues: (MASD) Skin Integrity Issues:: Stage II Stage II: buttocks  Last BM:  unknown  Height:   Ht Readings from Last 1 Encounters:  No data found for Ht    Weight:   Wt Readings from Last 1 Encounters:  11/19/20 63.8 kg    BMI:  There is no height or weight on file to calculate BMI.  Estimated Nutritional Needs:   Kcal:  1300-1500  Protein:  65-75 grams  Fluid:  >1.5 L/day  Cammy Copa., RD, LDN, CNSC See [facility] for contact information

## 2020-11-21 NOTE — Progress Notes (Signed)
Report given to [facility] 3W, pt transferred with all belongings to [phone]. Updated niece [name] on transfer.

## 2020-11-21 NOTE — Progress Notes (Signed)
Occupational Therapy Treatment Patient Details Name: Maria Dyer MRN: [id number] DOB: 07/08/1930 Today's Date: 11/21/2020    History of present illness 87 yo female L MCA near occlusion with revascularization TICI 3.  PMH HTN hypothroidism, severe hearing impairment Breast CAs/p mastectomy  thoracolumbar scoliosis   OT comments  Pt seen in conjunction with PT.  She requires min A for EOB sitting progressing to close min guard assist, and was able to comb hair with min A.  She requires mod A +2 for short distance ambulation.   She is able to follow simple commands with max multi modal cues.  Recommend CIR.   Follow Up Recommendations  CIR    Equipment Recommendations  3 in 1 bedside commode    Recommendations for Other Services Rehab consult    Precautions / Restrictions Precautions Precautions: Fall;Other (comment) Precaution Comments: SBP 120-140, global aphasia       Mobility Bed Mobility Overal bed mobility: Needs Assistance Bed Mobility: Supine to Sit     Supine to sit: Mod assist     General bed mobility comments: Pt requires physical assist to initiate moving LEs off the bed and for lifting trunk    Transfers Overall transfer level: Needs assistance Equipment used: 2 person hand held assist Transfers: Sit to/from [employer] Transfers Sit to Stand: Mod assist;+2 physical assistance;+2 safety/equipment Stand pivot transfers: Mod assist;+2 physical assistance;+2 safety/equipment       General transfer comment: assist for balance as pt demonstrates posterior lean.  She takes short, shuffling steps    Balance Overall balance assessment: Needs assistance Sitting-balance support: Feet unsupported Sitting balance-Leahy Scale: Poor Sitting balance - Comments: Pt requires min A - min guard assist for static sititng EOB Postural control: Posterior lean Standing balance support: Bilateral upper extremity supported Standing balance-Leahy Scale:  Poor Standing balance comment: reliant bil. UEs and mod A                           ADL either performed or assessed with clinical judgement   ADL Overall ADL's : Needs assistance/impaired     Grooming: Brushing hair;Minimal assistance;Sitting Grooming Details (indicate cue type and reason): Pt able to demonstrate correct use of comb.  She requires assist for thoroughness                 Toilet Transfer: Moderate assistance;+2 for physical assistance;+2 for safety/equipment;Stand-pivot;BSC;RW Toilet Transfer Details (indicate cue type and reason): assist for balance as pt with posterior lean.  She appears anxious and demonstrates short, shuffling gait         Functional mobility during ADLs: Moderate assistance;Total assistance;+2 for safety/equipment       Vision       Perception     Praxis      Cognition Arousal/Alertness: Awake/alert Behavior During Therapy: Anxious;WFL for tasks assessed/performed Overall Cognitive Status: Difficult to assess                                 General Comments: Pt with receptive and expressive communication deficits.  She is able to follow simple commands inconsistently with max  multi modal cues        Exercises     Shoulder Instructions       General Comments BP at beginning of session 173/70 and 199/92 at end of session - RN notified and administered meds    Pertinent Vitals/  Pain       Pain Assessment: Faces Pain Score: 0-No pain  Home Living                                          Prior Functioning/Environment              Frequency  Min 2X/week        Progress Toward Goals  OT Goals(current goals can now be found in the care plan section)  Progress towards OT goals: Progressing toward goals     Plan Discharge plan remains appropriate    Co-evaluation    PT/OT/SLP Co-Evaluation/Treatment: Yes Reason for Co-Treatment: For patient/therapist safety;To  address functional/ADL transfers   OT goals addressed during session: ADL's and self-care      AM-PAC OT "6 Clicks" Daily Activity     Outcome Measure   Help from another person eating meals?: A Lot Help from another person taking care of personal grooming?: A Lot Help from another person toileting, which includes using toliet, bedpan, or urinal?: A Lot Help from another person bathing (including washing, rinsing, drying)?: A Lot Help from another person to put on and taking off regular upper body clothing?: A Lot Help from another person to put on and taking off regular lower body clothing?: A Lot 6 Click Score: 12    End of Session Equipment Utilized During Treatment: Gait belt  OT Visit Diagnosis: Muscle weakness (generalized) (M62.81)   Activity Tolerance Patient tolerated treatment well   Patient Left in bed;with call bell/phone within reach;with nursing/sitter in room   Nurse Communication Mobility status        Time: 0938-1829 OT Time Calculation (min): 27 min  Charges: OT General Charges $OT Visit: 1 Visit OT Treatments $Neuromuscular Re-education: 8-22 mins  Eber Jones., OTR/L Acute Rehabilitation Services Pager [phone] Office [phone]    Jeani Hawking M 11/21/2020, 3:58 PM

## 2020-11-21 NOTE — Progress Notes (Signed)
Physical Therapy Treatment Patient Details Name: Maria Dyer MRN: [id number] DOB: 1930/04/11 Today's Date: 11/21/2020    History of Present Illness 87 yo female L MCA near occlusion with revascularization TICI 3.  PMH HTN hypothroidism, severe hearing impairment Breast CAs/p mastectomy  thoracolumbar scoliosis    PT Comments    The pt continues to present with deficits in strength, stability, and cognition due to above dx, but the pt was able to complete multiple sit-stand transfers and short bouts of gait today. She continues to require modA of 2 to maintain upright and steady, but was able to initiate steps without additional cues/assist. The pt does present with increased anxiety with ambulation and was unable to express concerns due to aphasia. Will continue to benefit from skilled PT acutely as well as CIR therapies at d/c to maximize functional recovery.    Follow Up Recommendations  CIR     Equipment Recommendations  3in1 (PT)    Recommendations for Other Services       Precautions / Restrictions Precautions Precautions: Fall;Other (comment) Precaution Comments: SBP 120-140, global aphasia Restrictions Weight Bearing Restrictions: No    Mobility  Bed Mobility Overal bed mobility: Needs Assistance Bed Mobility: Supine to Sit     Supine to sit: Mod assist     General bed mobility comments: Pt requires physical assist to initiate moving LEs off the bed and for lifting trunk    Transfers Overall transfer level: Needs assistance Equipment used: 2 person hand held assist Transfers: Sit to/from Stand;Stand Pivot Transfers Sit to Stand: Mod assist;+2 physical assistance;+2 safety/equipment Stand pivot transfers: Mod assist;+2 physical assistance;+2 safety/equipment       General transfer comment: assist for balance as pt demonstrates posterior lean.  She takes short, shuffling steps  Ambulation/Gait Ambulation/Gait assistance: Mod assist;+2 physical  assistance Gait Distance (Feet): 5 Feet (x3) Assistive device: 2 person hand held assist Gait Pattern/deviations: Step-to pattern;Ataxic;Shuffle;Trunk flexed;Narrow base of support Gait velocity: decreased   General Gait Details: pt with small steps, frequently stepping forward then back with each leg. very narrow BOS, pt with increased anxiery with ambulation   Modified Rankin (Stroke Patients Only) Modified Rankin (Stroke Patients Only) Pre-Morbid Rankin Score: Slight disability Modified Rankin: Moderately severe disability     Balance Overall balance assessment: Needs assistance Sitting-balance support: Feet unsupported Sitting balance-Leahy Scale: Poor Sitting balance - Comments: Pt requires min A - min guard assist for static sititng EOB Postural control: Posterior lean Standing balance support: Bilateral upper extremity supported Standing balance-Leahy Scale: Poor Standing balance comment: reliant bil. UEs and mod A                            Cognition Arousal/Alertness: Awake/alert Behavior During Therapy: Anxious;WFL for tasks assessed/performed Overall Cognitive Status: Difficult to assess Area of Impairment: Following commands                       Following Commands: Follows one step commands inconsistently       General Comments: Pt with receptive and expressive communication deficits.  She is able to follow simple commands inconsistently with max  multi modal cues      Exercises      General Comments General comments (skin integrity, edema, etc.): BP at beginning of session 173/70 and 199/92 at end of session - RN notified and administered meds      Pertinent Vitals/Pain Pain Assessment: Faces Pain Score: 0-No pain  Faces Pain Scale: No hurt Pain Intervention(s): Monitored during session     PT Goals (current goals can now be found in the care plan section) Acute Rehab PT Goals Patient Stated Goal: pt unable to state PT Goal  Formulation: With patient Time For Goal Achievement: 12/04/20 Potential to Achieve Goals: Good Progress towards PT goals: Progressing toward goals    Frequency    Min 4X/week      PT Plan Current plan remains appropriate    Co-evaluation PT/OT/SLP Co-Evaluation/Treatment: Yes Reason for Co-Treatment: Necessary to address cognition/behavior during functional activity;For patient/therapist safety;To address functional/ADL transfers PT goals addressed during session: Mobility/safety with mobility;Balance OT goals addressed during session: ADL's and self-care      AM-PAC PT "6 Clicks" Mobility   Outcome Measure  Help needed turning from your back to your side while in a flat bed without using bedrails?: A Little Help needed moving from lying on your back to sitting on the side of a flat bed without using bedrails?: A Little Help needed moving to and from a bed to a chair (including a wheelchair)?: A Lot Help needed standing up from a chair using your arms (e.g., wheelchair or bedside chair)?: A Lot Help needed to walk in hospital room?: Total Help needed climbing 3-5 steps with a railing? : Total 6 Click Score: 12    End of Session Equipment Utilized During Treatment: Gait belt Activity Tolerance: Patient tolerated treatment well Patient left: in bed;with nursing/sitter in room (pt in new bed to transfer off unit) Nurse Communication: Mobility status PT Visit Diagnosis: Unsteadiness on feet (R26.81);Muscle weakness (generalized) (M62.81);Difficulty in walking, not elsewhere classified (R26.2)     Time: 3329-5188 PT Time Calculation (min) (ACUTE ONLY): 24 min  Charges:  $Gait Training: 8-22 mins                     Lazarus Gowda, PT, DPT   Acute Rehabilitation Department Pager #: [phone]   Ronnie Derby 11/21/2020, 5:21 PM

## 2020-11-22 ENCOUNTER — Inpatient Hospital Stay (HOSPITAL_COMMUNITY): Payer: Medicare HMO

## 2020-11-22 DIAGNOSIS — I639 Cerebral infarction, unspecified: Secondary | ICD-10-CM

## 2020-11-22 LAB — CBC
HCT: 33.1 % — ABNORMAL LOW (ref 36.0–46.0)
Hemoglobin: 11.3 g/dL — ABNORMAL LOW (ref 12.0–15.0)
MCH: 32 pg (ref 26.0–34.0)
MCHC: 34.1 g/dL (ref 30.0–36.0)
MCV: 93.8 fL (ref 80.0–100.0)
Platelets: 212 10*3/uL (ref 150–400)
RBC: 3.53 MIL/uL — ABNORMAL LOW (ref 3.87–5.11)
RDW: 12.8 % (ref 11.5–15.5)
WBC: 8.1 10*3/uL (ref 4.0–10.5)
nRBC: 0 % (ref 0.0–0.2)

## 2020-11-22 LAB — GLUCOSE, CAPILLARY
Glucose-Capillary: 101 mg/dL — ABNORMAL HIGH (ref 70–99)
Glucose-Capillary: 75 mg/dL (ref 70–99)
Glucose-Capillary: 162 mg/dL — ABNORMAL HIGH (ref 70–99)
Glucose-Capillary: 155 mg/dL — ABNORMAL HIGH (ref 70–99)
Glucose-Capillary: 79 mg/dL (ref 70–99)

## 2020-11-22 LAB — BASIC METABOLIC PANEL
Anion gap: 8 (ref 5–15)
BUN: 13 mg/dL (ref 8–23)
CO2: 25 mmol/L (ref 22–32)
Calcium: 8.8 mg/dL — ABNORMAL LOW (ref 8.9–10.3)
Chloride: 100 mmol/L (ref 98–111)
Creatinine, Ser: 1.07 mg/dL — ABNORMAL HIGH (ref 0.44–1.00)
GFR, Estimated: 50 mL/min — ABNORMAL LOW (ref >60)
Glucose, Bld: 90 mg/dL (ref 70–99)
Potassium: 3.9 mmol/L (ref 3.5–5.1)
Sodium: 133 mmol/L — ABNORMAL LOW (ref 135–145)

## 2020-11-22 NOTE — Care Management Important Message (Signed)
Important Message  Patient Details  Name: Maria Dyer MRN: [id number] Date of Birth: 16-Nov-1930   Medicare Important Message Given:  Yes     Juliannah Ohmann Stefan Church 11/22/2020, 4:06 PM

## 2020-11-22 NOTE — Progress Notes (Signed)
Physical Therapy Treatment Patient Details Name: Maria Dyer MRN: [id number] DOB: Dec 10, 1930 Today's Date: 11/22/2020    History of Present Illness 87 yo female L MCA near occlusion with revascularization TICI 3.  PMH HTN hypothroidism, severe hearing impairment Breast CAs/p mastectomy  thoracolumbar scoliosis    PT Comments    Patient limited by fear of falling and resistant to attempts of ambulation. Patient requires modA+2 for sit to stand but demos posterior lean and resistant to assist to bring hips forward. Per niece, patient limited household ambulator with RW. Unsure if patient will tolerate intensive therapy at [facility] at this time due to resistant to mobility. Updated d/c recommendation to SNF at this time. Will continue to assess.      Follow Up Recommendations  SNF     Equipment Recommendations  3in1 (PT)    Recommendations for Other Services       Precautions / Restrictions Precautions Precautions: Fall;Other (comment) Precaution Comments: SBP 120-140, global aphasia Restrictions Weight Bearing Restrictions: No    Mobility  Bed Mobility               General bed mobility comments: in recliner on arrival    Transfers Overall transfer level: Needs assistance Equipment used: 2 person hand held assist Transfers: Sit to/from Stand Sit to Stand: Mod assist;+2 physical assistance         General transfer comment: modA+2 to rise and balance. Patient with posterior lean due to feeling anxious and fear of falling. Max multimodal cues to perform. Sit to stand x 4 during session. Improved with last 2 attempts when brought in front of sink  Ambulation/Gait             General Gait Details: patient resistant to attempts to ambulate this session due to fear of falling   Stairs             Wheelchair Mobility    Modified Rankin (Stroke Patients Only) Modified Rankin (Stroke Patients Only) Pre-Morbid Rankin Score: Slight disability Modified  Rankin: Moderately severe disability     Balance Overall balance assessment: Needs assistance Sitting-balance support: Feet unsupported Sitting balance-Leahy Scale: Poor Sitting balance - Comments: requires min guard to sit at edge of recliner for safety Postural control: Posterior lean Standing balance support: Bilateral upper extremity supported Standing balance-Leahy Scale: Poor Standing balance comment: reliant on UE support and external assist. Was able to brush hair at sink with single UE support and min-modA                            Cognition Arousal/Alertness: Awake/alert Behavior During Therapy: Anxious;WFL for tasks assessed/performed Overall Cognitive Status: Difficult to assess Area of Impairment: Following commands                       Following Commands: Follows one step commands inconsistently       General Comments: Pt with receptive and expressive communication deficits.  She is able to follow simple commands inconsistently with max  multi modal cues. Communication is purposeful and will name objects spontaneously at times without cueing      Exercises      General Comments        Pertinent Vitals/Pain Pain Assessment: Faces Faces Pain Scale: No hurt Pain Intervention(s): Monitored during session    Home Living  Prior Function            PT Goals (current goals can now be found in the care plan section) Acute Rehab PT Goals Patient Stated Goal: Unable to state PT Goal Formulation: With patient Time For Goal Achievement: 12/04/20 Potential to Achieve Goals: Good Progress towards PT goals: Progressing toward goals    Frequency    Min 3X/week      PT Plan Discharge plan needs to be updated;Frequency needs to be updated    Co-evaluation PT/OT/SLP Co-Evaluation/Treatment: Yes Reason for Co-Treatment: Necessary to address cognition/behavior during functional activity;For patient/therapist  safety;To address functional/ADL transfers PT goals addressed during session: Mobility/safety with mobility;Balance        AM-PAC PT "6 Clicks" Mobility   Outcome Measure  Help needed turning from your back to your side while in a flat bed without using bedrails?: A Little Help needed moving from lying on your back to sitting on the side of a flat bed without using bedrails?: A Little Help needed moving to and from a bed to a chair (including a wheelchair)?: Total Help needed standing up from a chair using your arms (e.g., wheelchair or bedside chair)?: Total Help needed to walk in hospital room?: Total Help needed climbing 3-5 steps with a railing? : Total 6 Click Score: 10    End of Session Equipment Utilized During Treatment: Gait belt Activity Tolerance: Patient tolerated treatment well Patient left: in chair;with call bell/phone within reach;with chair alarm set;with family/visitor present Nurse Communication: Mobility status PT Visit Diagnosis: Unsteadiness on feet (R26.81);Muscle weakness (generalized) (M62.81);Difficulty in walking, not elsewhere classified (R26.2)     Time: 1117-1140 PT Time Calculation (min) (ACUTE ONLY): 23 min  Charges:  $Therapeutic Activity: 8-22 mins                     Mohanad Carsten A. Dan Humphreys PT, DPT Acute Rehabilitation Services Pager [phone] Office [phone]    Viviann Spare 11/22/2020, 12:41 PM

## 2020-11-22 NOTE — H&P (Signed)
Physical Medicine and Rehabilitation Admission H&P    Chief Complaint  Patient presents with   Stroke Symptoms  : HPI: Maria Dyer is an 87 year old right-handed female with history of hypertension, severely hearing impaired, breast cancer status postmastectomy 25 years ago, hypothyroidism, hypertension.  Per chart review patient lives alone.  Household ambulator with walker.  Uses a wheelchair for longer distances in the community.  Independent with ADLs.  She has a niece as well as a grand niece in the area.  Presented 11/19/2020 with aphasia.  Admission chemistries unremarkable except creatinine 1.13.  CT of the head/CT angiogram head and neck, CT cerebral perfusion scan showed no acute intracranial hemorrhage.  Small acute left MCA territory infarct involving inferior insula and adjacent temporal lobe.  Early bifurcation of the left MCA and distal M1 and proximal M2 occlusion and subsequent partial reconstitution.  High-grade stenosis or nonocclusive thrombus within anterior temporal branch approximate 5 mm beyond its origin just proximal to the occlusion.  Perfusion imaging demonstrated 5 mL of core infarct partially corresponding to noncontrast CT abnormality.  No occlusion or hemodynamically significant stenosis in the neck.  Patient did not receive tPA.  Underwent mechanical thrombectomy per interventional radiology with stenting spanning the left M1-M2 posterior division.  MRI of the head post stenting showed multifocal acute ischemic left MCA distribution infarct involving left parietal and temporal lobes.  Minimal associated petechial hemorrhage without frank intraparenchymal hematoma or mass-effect.  No other acute intracranial abnormality.  Echocardiogram with ejection fraction of 60 to 65% no wall motion abnormalities.  Neurology follow-up maintained on aspirin 81 mg daily as well as Brilinta 90 mg twice daily.  Lower extremity Dopplers negative for DVT.  Awaiting plan for possible need  for loop recorder.  Tolerating a regular diet.  W OC follow-up for MASD buttocks and groin with skin care as directed.  Therapy evaluations completed due to patient's aphasia decreased functional mobility was admitted for a comprehensive rehab program. Currently very sleepy.  Review of Systems  Constitutional:  Negative for chills and fever.  HENT:  Positive for hearing loss.   Eyes:  Negative for blurred vision and double vision.  Respiratory:  Negative for cough and shortness of breath.   Cardiovascular:  Negative for chest pain, palpitations and leg swelling.  Gastrointestinal:  Positive for constipation. Negative for heartburn, nausea and vomiting.  Genitourinary:  Negative for dysuria, flank pain and hematuria.  Musculoskeletal:  Positive for joint pain and myalgias.  Skin:  Negative for rash.  Neurological:  Positive for speech change.  All other systems reviewed and are negative. Past Medical History:  Diagnosis Date   Hypertension    Past Surgical History:  Procedure Laterality Date   IR CT HEAD LTD  11/19/2020   IR INTRA CRAN STENT  11/19/2020   IR PERCUTANEOUS ART THROMBECTOMY/INFUSION INTRACRANIAL INC DIAG ANGIO  11/19/2020   IR US GUIDE VASC ACCESS LEFT  11/19/2020   RADIOLOGY WITH ANESTHESIA N/A 11/19/2020   Procedure: IR WITH ANESTHESIA;  Surgeon: Radiologist, Medication, MD;  Location: [facility] OR;  Service: Radiology;  Laterality: N/A;   History reviewed. No pertinent family history. Social History:  reports that she has never smoked. She has never used smokeless tobacco. No history on file for alcohol use and drug use. Allergies: No Known Allergies Medications Prior to Admission  Medication Sig Dispense Refill   hydrochlorothiazide (HYDRODIURIL) 25 MG tablet Take 25 mg by mouth daily.     levothyroxine (SYNTHROID) 50 MCG tablet Take  50 mcg by mouth daily.     losartan (COZAAR) 100 MG tablet Take 100 mg by mouth daily.     metoprolol succinate (TOPROL-XL) 50 MG 24 hr tablet  Take 50 mg by mouth 2 (two) times daily.      Drug Regimen Review Drug regimen was reviewed and remains appropriate with no significant issues identified  Home: Home Living Family/patient expects to be discharged to:: Private residence Living Arrangements: Alone Available Help at Discharge: Family, Available PRN/intermittently Type of Home: House Home Equipment: Dan Humphreys - 2 wheels, Wheelchair - manual Additional Comments: niece and nephew present work as Regulatory affairs officer attendants that are based out of [place]. they meet once a week for lunch  Lives With: Alone   Functional History: Prior Function Level of Independence: Needs assistance Gait / Transfers Assistance Needed: uses walker, mainly household ambulator. w/c for longer distances out in community ADL's / Homemaking Assistance Needed: independent ADL's, medications, finances. pt family assists with transportation  Functional Status:  Mobility: Bed Mobility Overal bed mobility: Needs Assistance Bed Mobility: Supine to Sit Supine to sit: Mod assist General bed mobility comments: in recliner on arrival Transfers Overall transfer level: Needs assistance Equipment used: 2 person hand held assist Transfers: Sit to/from Stand Sit to Stand: Mod assist, +2 physical assistance Stand pivot transfers: Mod assist, +2 physical assistance, +2 safety/equipment General transfer comment: patient not willing to attempt steps this date.  Niece states she was weak prior to admission, and only walked short household distances. Ambulation/Gait Ambulation/Gait assistance: Mod assist, +2 physical assistance Gait Distance (Feet): 5 Feet (x3) Assistive device: 2 person hand held assist Gait Pattern/deviations: Step-to pattern, Ataxic, Shuffle, Trunk flexed, Narrow base of support General Gait Details: patient resistant to attempts to ambulate this session due to fear of falling Gait velocity: decreased    ADL: ADL Overall ADL's : Needs  assistance/impaired Eating/Feeding: Minimal assistance, Sitting Eating/Feeding Details (indicate cue type and reason): eating an egg sandwich provided by family. Grooming: Brushing hair, Minimal assistance, Sitting, Oral care, Standing Grooming Details (indicate cue type and reason): setup and supervision for brushing teeth in sit.  Mod A for throughness to stand and comb hair.  Only able to stand for short bouts - supporting herself with L hand and leaning on countertop. Lower Body Bathing Details (indicate cue type and reason): pt noted to have skin break down in peri area that was prior to admission. question hygiene and detail for task prior. pt noted to have blood on pad in room. RN to consult wound care for area after visualizing with therapist helping sustain standing Toilet Transfer: Moderate assistance, +2 for physical assistance, +2 for safety/equipment, Stand-pivot, BSC, RW Toilet Transfer Details (indicate cue type and reason): assist for balance as pt with posterior lean.  She appears anxious and demonstrates short, shuffling gait Functional mobility during ADLs: Moderate assistance, Total assistance, +2 for safety/equipment  Cognition: Cognition Overall Cognitive Status: Difficult to assess Arousal/Alertness: Awake/alert Orientation Level: Oriented to person Attention: Sustained Sustained Attention: Impaired Sustained Attention Impairment: Functional basic Awareness: Impaired Awareness Impairment: Anticipatory impairment, Emergent impairment Problem Solving:  (will continue diagnostic treatment) Behaviors: Perseveration Safety/Judgment: Impaired Comments: Pulls of medical equipment, states she will be going home with her neice today. Cognition Arousal/Alertness: Awake/alert Behavior During Therapy: Anxious, WFL for tasks assessed/performed Overall Cognitive Status: Difficult to assess Area of Impairment: Following commands Following Commands: Follows one step commands  inconsistently General Comments: Pt with receptive and expressive communication deficits.  She is able to follow simple  commands inconsistently with max  multi modal cues. Communication is purposeful and will name objects spontaneously at times without cueing Difficult to assess due to: Impaired communication  Physical Exam: Blood pressure (!) 146/76, pulse 76, temperature 97.6 F (36.4 C), temperature source Oral, resp. rate 14, height 5\' 9"  (1.753 m), weight 63.8 kg, SpO2 96 %. Physical Exam Gen: no distress, normal appearing HEENT: oral mucosa pink and moist, very hard of hearing Cardio: Reg rate Chest: normal effort, normal rate of breathing Abd: soft, non-distended Ext: no edema Psych: pleasant, normal affect Skin: intact Neurological:     Comments: Patient is severely hard of hearing.  Patient is awake alert, eyes are open.  Globally aphasic however she did have some short intermittent meaningful  sentences. She did not follow commands. ModA gait within room  Results for orders placed or performed during the hospital encounter of 11/19/20 (from the past 48 hour(s))  Glucose, capillary     Status: Abnormal   Collection Time: 11/23/20  4:57 PM  Result Value Ref Range   Glucose-Capillary 151 (H) 70 - 99 mg/dL    Comment: Glucose reference range applies only to samples taken after fasting for at least 8 hours.  Glucose, capillary     Status: Abnormal   Collection Time: 11/23/20  9:33 PM  Result Value Ref Range   Glucose-Capillary 58 (L) 70 - 99 mg/dL    Comment: Glucose reference range applies only to samples taken after fasting for at least 8 hours.   Comment 1 Notify RN    Comment 2 Document in Chart   Glucose, capillary     Status: None   Collection Time: 11/23/20 10:20 PM  Result Value Ref Range   Glucose-Capillary 79 70 - 99 mg/dL    Comment: Glucose reference range applies only to samples taken after fasting for at least 8 hours.   Comment 1 Notify RN    Comment 2  Document in Chart   Glucose, capillary     Status: Abnormal   Collection Time: 11/24/20 12:52 AM  Result Value Ref Range   Glucose-Capillary 163 (H) 70 - 99 mg/dL    Comment: Glucose reference range applies only to samples taken after fasting for at least 8 hours.   Comment 1 Notify RN    Comment 2 Document in Chart   CBC     Status: Abnormal   Collection Time: 11/24/20  1:29 AM  Result Value Ref Range   WBC 12.2 (H) 4.0 - 10.5 K/uL   RBC 3.91 3.87 - 5.11 MIL/uL   Hemoglobin 12.3 12.0 - 15.0 g/dL   HCT 11/26/20 76.5 - 46.5 %   MCV 95.9 80.0 - 100.0 fL   MCH 31.5 26.0 - 34.0 pg   MCHC 32.8 30.0 - 36.0 g/dL   RDW 03.5 46.5 - 68.1 %   Platelets 322 150 - 400 K/uL   nRBC 0.0 0.0 - 0.2 %    Comment: Performed at [facility], [address], [place], [place]  Basic metabolic panel     Status: Abnormal   Collection Time: 11/24/20  1:29 AM  Result Value Ref Range   Sodium 137 135 - 145 mmol/L   Potassium 4.3 3.5 - 5.1 mmol/L   Chloride 102 98 - 111 mmol/L   CO2 24 22 - 32 mmol/L   Glucose, Bld 152 (H) 70 - 99 mg/dL    Comment: Glucose reference range applies only to samples taken after fasting for at  least 8 hours.   BUN 20 8 - 23 mg/dL   Creatinine, Ser 1.61 (H) 0.44 - 1.00 mg/dL   Calcium 9.5 8.9 - 09.6 mg/dL   GFR, Estimated 47 (L) >60 mL/min    Comment: (NOTE) Calculated using the CKD-EPI Creatinine Equation (2021)    Anion gap 11 5 - 15    Comment: Performed at [facility], [address], [place], [place] [address]  Glucose, capillary     Status: None   Collection Time: 11/24/20  6:46 AM  Result Value Ref Range   Glucose-Capillary 89 70 - 99 mg/dL    Comment: Glucose reference range applies only to samples taken after fasting for at least 8 hours.  Glucose, capillary     Status: Abnormal   Collection Time: 11/24/20 12:31 PM  Result Value Ref Range   Glucose-Capillary 185 (H) 70 - 99 mg/dL    Comment: Glucose reference range applies only to samples  taken after fasting for at least 8 hours.  Glucose, capillary     Status: Abnormal   Collection Time: 11/24/20  4:35 PM  Result Value Ref Range   Glucose-Capillary 103 (H) 70 - 99 mg/dL    Comment: Glucose reference range applies only to samples taken after fasting for at least 8 hours.  Glucose, capillary     Status: Abnormal   Collection Time: 11/24/20  9:28 PM  Result Value Ref Range   Glucose-Capillary 108 (H) 70 - 99 mg/dL    Comment: Glucose reference range applies only to samples taken after fasting for at least 8 hours.  CBC with Differential/Platelet     Status: Abnormal   Collection Time: 11/25/20  1:02 AM  Result Value Ref Range   WBC 8.5 4.0 - 10.5 K/uL   RBC 3.44 (L) 3.87 - 5.11 MIL/uL   Hemoglobin 11.0 (L) 12.0 - 15.0 g/dL   HCT 98.1 (L) 19.1 - 47.8 %   MCV 95.1 80.0 - 100.0 fL   MCH 32.0 26.0 - 34.0 pg   MCHC 33.6 30.0 - 36.0 g/dL   RDW 29.5 62.1 - 30.8 %   Platelets 275 150 - 400 K/uL   nRBC 0.0 0.0 - 0.2 %   Neutrophils Relative % 73 %   Neutro Abs 6.2 1.7 - 7.7 K/uL   Lymphocytes Relative 11 %   Lymphs Abs 1.0 0.7 - 4.0 K/uL   Monocytes Relative 9 %   Monocytes Absolute 0.8 0.1 - 1.0 K/uL   Eosinophils Relative 5 %   Eosinophils Absolute 0.4 0.0 - 0.5 K/uL   Basophils Relative 1 %   Basophils Absolute 0.1 0.0 - 0.1 K/uL   Immature Granulocytes 1 %   Abs Immature Granulocytes 0.04 0.00 - 0.07 K/uL    Comment: Performed at [facility], [address], [place], [place] [address]  Comprehensive metabolic panel     Status: Abnormal   Collection Time: 11/25/20  1:02 AM  Result Value Ref Range   Sodium 134 (L) 135 - 145 mmol/L   Potassium 4.0 3.5 - 5.1 mmol/L   Chloride 104 98 - 111 mmol/L   CO2 22 22 - 32 mmol/L   Glucose, Bld 107 (H) 70 - 99 mg/dL    Comment: Glucose reference range applies only to samples taken after fasting for at least 8 hours.   BUN 26 (H) 8 - 23 mg/dL   Creatinine, Ser 6.96 (H) 0.44 - 1.00 mg/dL   Calcium 8.8 (L) 8.9 -  10.3  mg/dL   Total Protein 5.8 (L) 6.5 - 8.1 g/dL   Albumin 2.8 (L) 3.5 - 5.0 g/dL   AST 19 15 - 41 U/L   ALT 16 0 - 44 U/L   Alkaline Phosphatase 46 38 - 126 U/L   Total Bilirubin 0.9 0.3 - 1.2 mg/dL   GFR, Estimated 43 (L) >60 mL/min    Comment: (NOTE) Calculated using the CKD-EPI Creatinine Equation (2021)    Anion gap 8 5 - 15    Comment: Performed at [facility], [address] [place], [place] [address]  Urinalysis, Routine w reflex microscopic Urine, Clean Catch     Status: Abnormal   Collection Time: 11/25/20  3:33 AM  Result Value Ref Range   Color, Urine YELLOW YELLOW   APPearance HAZY (A) CLEAR   Specific Gravity, Urine 1.014 1.005 - 1.030   pH 5.0 5.0 - 8.0   Glucose, UA NEGATIVE NEGATIVE mg/dL   Hgb urine dipstick MODERATE (A) NEGATIVE   Bilirubin Urine NEGATIVE NEGATIVE   Ketones, ur NEGATIVE NEGATIVE mg/dL   Protein, ur NEGATIVE NEGATIVE mg/dL   Nitrite NEGATIVE NEGATIVE   Leukocytes,Ua MODERATE (A) NEGATIVE   RBC / HPF 0-5 0 - 5 RBC/hpf   WBC, UA 6-10 0 - 5 WBC/hpf   Bacteria, UA RARE (A) NONE SEEN   Squamous Epithelial / LPF 0-5 0 - 5    Comment: Performed at [facility], [address], [place], [place] [address]  Glucose, capillary     Status: Abnormal   Collection Time: 11/25/20  6:41 AM  Result Value Ref Range   Glucose-Capillary 110 (H) 70 - 99 mg/dL    Comment: Glucose reference range applies only to samples taken after fasting for at least 8 hours.   DG Chest 1 View  Result Date: 11/24/2020 CLINICAL DATA:  Aspiration into airway. EXAM: CHEST  1 VIEW COMPARISON:  None. FINDINGS: Scoliotic curvature of the thoracolumbar spine. No pneumothorax. Healed left rib fractures. The heart, hila, and mediastinum are unremarkable with no acute abnormalities. Calcified atherosclerosis is seen in the thoracic aorta. No pulmonary nodules or masses. No focal infiltrates. No acute abnormalities. IMPRESSION: No active disease. Electronically Signed   By: Gerome Sam III M.D.   On: 11/24/2020 13:36       Medical Problem List and Plan: 1.  Aphasia secondary to left MCA patchy infarct/left M2 near occlusion status post revascularization with stenting  -patient may shower  -ELOS/Goals: 10-14 days S  -Check Vitamin D level tomorrow 2.  Antithrombotics: -DVT/anticoagulation:  Mechanical: Sequential compression devices, below knee Bilateral lower extremities  -antiplatelet therapy: Aspirin 81 mg daily and Brilinta 90 mg twice daily 3. Pain Management: Tylenol as needed 4. Mood: Provide emotional support  -antipsychotic agents: N/A 5. Neuropsych: This patient is not capable of making decisions on her own behalf. 6. Skin/Wound Care: Routine skin checks 7. Fluids/Electrolytes/Nutrition: Routine in and outs with follow-up chemistries 8.  Hyperlipidemia.  Lipitor 9.  Hypothyroidism.  Synthroid 10.  Hypertension.  Labile, continue Cozaar 100 mg daily, Toprol-XL 50 mg daily.  Monitor with increased mobility. Check magnesium level tomorrow.  11.  MASD buttocks and groin.  Zinc oxide as directed with routine skin checks 12. AKI: Repeat Cr tomorrow.   I have personally performed a face to face diagnostic evaluation, including, but not limited to relevant history and physical exam findings, of this patient and developed relevant assessment and plan.  Additionally, I have reviewed  and concur with the physician assistant's documentation above.  Sula SodaKrutika Keng Jewel, MD  Mcarthur Rossettianiel J Angiulli, PA-C 11/25/2020

## 2020-11-22 NOTE — Plan of Care (Signed)
  Problem: Education: Goal: Knowledge of disease or condition will improve Outcome: Progressing Goal: Knowledge of secondary prevention will improve Outcome: Progressing Goal: Knowledge of patient specific risk factors addressed and post discharge goals established will improve Outcome: Progressing Goal: Individualized Educational Video(s) Outcome: Progressing   Problem: Coping: Goal: Will verbalize positive feelings about self Outcome: Progressing Goal: Will identify appropriate support needs Outcome: Progressing   Problem: Health Behavior/Discharge Planning: Goal: Ability to manage health-related needs will improve Outcome: Progressing   Problem: Nutrition: Goal: Risk of aspiration will decrease Outcome: Progressing Goal: Dietary intake will improve Outcome: Progressing   Problem: Ischemic Stroke/TIA Tissue Perfusion: Goal: Complications of ischemic stroke/TIA will be minimized Outcome: Progressing   Problem: Education: Goal: Knowledge of General Education information will improve Description: Including pain rating scale, medication(s)/side effects and non-pharmacologic comfort measures Outcome: Progressing   Problem: Health Behavior/Discharge Planning: Goal: Ability to manage health-related needs will improve Outcome: Progressing   Problem: Nutrition: Goal: Adequate nutrition will be maintained Outcome: Progressing

## 2020-11-22 NOTE — Progress Notes (Signed)
Occupational Therapy Treatment Patient Details Name: Maria Dyer MRN: [id number] DOB: Jan 11, 1931 Today's Date: 11/22/2020    History of present illness 87 yo female L MCA near occlusion with revascularization TICI 3.  PMH HTN hypothroidism, severe hearing impairment Breast CAs/p mastectomy  thoracolumbar scoliosis   OT comments  Patient with incremental progress toward patient goals.  Patient was very hesitant to stand, and declined any attempts at steps.  Patient seen in conjunction with PT given inconsistencies in performance.  Patient needing encouragement to stand at sink, and needing up to Mod A for light grooming in stand.  CIR has been recommended.  OT to continue efforts in the acute setting.    Follow Up Recommendations  CIR    Equipment Recommendations  3 in 1 bedside commode    Recommendations for Other Services Rehab consult    Precautions / Restrictions Precautions Precautions: Fall Precaution Comments: global aphasia Restrictions Weight Bearing Restrictions: No       Mobility Bed Mobility               General bed mobility comments: in recliner on arrival Patient Response: Anxious  Transfers Overall transfer level: Needs assistance Equipment used: 2 person hand held assist Transfers: Sit to/from Stand Sit to Stand: Mod assist;+2 physical assistance         General transfer comment: patient not willing to attempt steps this date.  Niece states she was weak prior to admission, and only walked short household distances.    Balance Overall balance assessment: Needs assistance Sitting-balance support: Feet supported Sitting balance-Leahy Scale: Fair Sitting balance - Comments: requires min guard to sit at edge of recliner for safety Postural control: Posterior lean Standing balance support: Bilateral upper extremity supported Standing balance-Leahy Scale: Poor Standing balance comment: reliant on UE support and external assist. Was able to brush  hair at sink with single UE support and min-modA                           ADL either performed or assessed with clinical judgement   ADL       Grooming: Brushing hair;Minimal assistance;Sitting;Oral care;Standing Grooming Details (indicate cue type and reason): setup and supervision for brushing teeth in sit.  Mod A for throughness to stand and comb hair.  Only able to stand for short bouts - supporting herself with L hand and leaning on countertop.                                     Vision Baseline Vision/History: 1 Wears glasses Patient Visual Report: No change from baseline                Cognition Arousal/Alertness: Awake/alert Behavior During Therapy: Anxious;WFL for tasks assessed/performed Overall Cognitive Status: Difficult to assess Area of Impairment: Following commands                       Following Commands: Follows one step commands inconsistently       General Comments: Pt with receptive and expressive communication deficits.  She is able to follow simple commands inconsistently with max  multi modal cues. Communication is purposeful and will name objects spontaneously at times without cueing                          Pertinent Vitals/  Pain       Pain Assessment: Faces Faces Pain Scale: No hurt Pain Intervention(s): Monitored during session                                                          Frequency  Min 2X/week        Progress Toward Goals  OT Goals(current goals can now be found in the care plan section)     Acute Rehab OT Goals Patient Stated Goal: Unable to state OT Goal Formulation: Patient unable to participate in goal setting Potential to Achieve Goals: Fair  Plan Discharge plan remains appropriate    Co-evaluation    PT/OT/SLP Co-Evaluation/Treatment: Yes Reason for Co-Treatment: Complexity of the patient's impairments (multi-system involvement);For  patient/therapist safety;To address functional/ADL transfers PT goals addressed during session: Mobility/safety with mobility;Balance OT goals addressed during session: ADL's and self-care      AM-PAC OT "6 Clicks" Daily Activity     Outcome Measure   Help from another person eating meals?: A Lot Help from another person taking care of personal grooming?: A Lot Help from another person toileting, which includes using toliet, bedpan, or urinal?: A Lot Help from another person bathing (including washing, rinsing, drying)?: A Lot Help from another person to put on and taking off regular upper body clothing?: A Lot Help from another person to put on and taking off regular lower body clothing?: A Lot 6 Click Score: 12    End of Session Equipment Utilized During Treatment: Gait belt  OT Visit Diagnosis: Muscle weakness (generalized) (M62.81)   Activity Tolerance Other (comment) (limited by fear of falling)   Patient Left in chair;with call bell/phone within reach;with chair alarm set;with family/visitor present   Nurse Communication Mobility status        Time: 1117-1140 OT Time Calculation (min): 23 min  Charges: OT General Charges $OT Visit: 1 Visit OT Treatments $Self Care/Home Management : 8-22 mins  11/22/2020  RP, OTR/L  Acute Rehabilitation Services  Office:  [phone]    Suzanna Obey 11/22/2020, 1:58 PM

## 2020-11-22 NOTE — Progress Notes (Addendum)
STROKE TEAM PROGRESS NOTE   INTERVAL HISTORY There is no family at the bedside.  Patient is bed, NAD. Patient is now able to speak sentences but has Fluent aphasia, cannot follow commands even to pantomime or answer questions correctly. Neuro exam appears unchanged from yesterday. PT/OT recommending CIR.  Vitals:   11/21/20 2002 11/21/20 2327 11/22/20 0355 11/22/20 1142  BP: (!) 159/61 (!) 155/131 (!) 155/70 130/76  Pulse: 73 80 84 79  Resp: 18 18 18 18   Temp: (!) 97.5 F (36.4 C) 98.1 F (36.7 C) 97.9 F (36.6 C) 97.8 F (36.6 C)  TempSrc: Oral Oral Oral Oral  SpO2: 99% 98% 99% 98%  Weight:       CBC:  Recent Labs  Lab 11/19/20 1401 11/19/20 1422 11/20/20 0446 11/22/20 0122  WBC 6.9  --  5.8 8.1  NEUTROABS 5.2  --   --   --   HGB 12.5   < > 10.5* 11.3*  HCT 36.7   < > 30.8* 33.1*  MCV 94.8  --  94.8 93.8  PLT 227  --  174 212   < > = values in this interval not displayed.   Basic Metabolic Panel:  Recent Labs  Lab 11/20/20 0446 11/22/20 0122  NA 137 133*  K 3.4* 3.9  CL 104 100  CO2 27 25  GLUCOSE 105* 90  BUN 14 13  CREATININE 1.06* 1.07*  CALCIUM 8.5* 8.8*   Lipid Panel:  Recent Labs  Lab 11/20/20 0446  CHOL 205*  TRIG 91  HDL 48  CHOLHDL 4.3  VLDL 18  LDLCALC 11/22/20*   HgbA1c:  Recent Labs  Lab 11/20/20 0446  HGBA1C 5.9*   Urine Drug Screen:  Recent Labs  Lab 11/19/20 1831  LABOPIA NONE DETECTED  COCAINSCRNUR NONE DETECTED  LABBENZ NONE DETECTED  AMPHETMU NONE DETECTED  THCU NONE DETECTED  LABBARB NONE DETECTED    Alcohol Level  Recent Labs  Lab 11/19/20 1401  ETH <10    IMAGING past 24 hours VAS 11/21/20 TRANSCRANIAL DOPPLER  Result Date: 11/22/2020  Transcranial Doppler Patient Name:  Maria Dyer  Date of Exam:   11/22/2020 Medical Rec #: [id number]        Accession #:    [id number] Date of Birth: [birth year]        Patient Gender: F Patient Age:   87 years Exam Location:  [facility] Procedure:      VAS [facility] TRANSCRANIAL  DOPPLER Referring Phys: Alexx Giambra --------------------------------------------------------------------------------  Indications: Stroke. Limitations for diagnostic windows: Unable to insonate right transtemporal window. Unable to insonate left transtemporal window. Comparison Study: No prior studies. Performing Technologist: [place] RDMS, RVT  Examination Guidelines: A complete evaluation includes B-mode imaging, spectral Doppler, color Doppler, and power Doppler as needed of all accessible portions of each vessel. Bilateral testing is considered an integral part of a complete examination. Limited examinations for reoccurring indications may be performed as noted.  +----------+-------------+----------+-----------+------------------+ RIGHT TCD Right VM (cm)Depth (cm)Pulsatility     Comment       +----------+-------------+----------+-----------+------------------+ MCA                                         Unable to insonate +----------+-------------+----------+-----------+------------------+ ACA  Unable to insonate +----------+-------------+----------+-----------+------------------+ Term ICA                                    Unable to insonate +----------+-------------+----------+-----------+------------------+ PCA                                         Unable to insonate +----------+-------------+----------+-----------+------------------+ Opthalmic     22.00                 1.61                       +----------+-------------+----------+-----------+------------------+ ICA siphon                                  Unable to insonate +----------+-------------+----------+-----------+------------------+ Vertebral    -11.00                 0.59                       +----------+-------------+----------+-----------+------------------+  +----------+------------+----------+-----------+-------------------------------+ LEFT TCD   Left VM (cm)Depth (cm)Pulsatility            Comment             +----------+------------+----------+-----------+-------------------------------+ MCA                                              Unable to insonate        +----------+------------+----------+-----------+-------------------------------+ ACA                                         Unable to insonate Unable to                                                         insonate             +----------+------------+----------+-----------+-------------------------------+ Term ICA                                         Unable to insonate        +----------+------------+----------+-----------+-------------------------------+ PCA                                              Unable to insonate        +----------+------------+----------+-----------+-------------------------------+ Opthalmic    22.00                 1.60                                    +----------+------------+----------+-----------+-------------------------------+ ICA siphon  Unable to insonate        +----------+------------+----------+-----------+-------------------------------+ Vertebral    -12.00                1.03                                    +----------+------------+----------+-----------+-------------------------------+  +------------+------+-------+             VM cm Comment +------------+------+-------+ Prox Basilar-15.00        +------------+------+-------+    Preliminary     PHYSICAL EXAM  Temp:  [97.5 F (36.4 C)-99.1 F (37.3 C)] 97.8 F (36.6 C) (08/26 1142) Pulse Rate:  [72-97] 79 (08/26 1142) Resp:  [14-37] 18 (08/26 1142) BP: (130-199)/(61-131) 130/76 (08/26 1142) SpO2:  [95 %-99 %] 98 % (08/26 1142)  General - Well nourished, well developed, in no apparent distress.  Ophthalmologic - normal sclera  Cardiovascular - Regular rhythm and rate.  Mental Status  -  Level of arousal- she is awake and alert, with Fluent aphasia. Able to say periodically say short sentences/words out however is nonsensical and not appropriate for conversation. Not able to follow commands  Language globally aphasia. No dysarthria noted   Cranial Nerves II - XII - II - Visual field intact OU. Blinks to threat III, IV, VI - Extraocular movements intact, can track  V - Facial sensation intact bilaterally. VII - Facial movement intact bilaterally. VIII - Hearing & vestibular intact bilaterally. X - Palate elevates symmetrically. XII - not able to assess  Motor Strength - Able to move all 4 extremities spontaneously, bilateral uppers and lowers 4/5. Bulk was normal and fasciculations were absent.   Motor Tone - Muscle tone was assessed at the neck and appendages and was normal.  Sensory - responds to noxious stimuli  Coordination - Unable to assess .  Gait and Station - deferred.   ASSESSMENT/PLAN Maria Dyer is a 87 y.o. female with history of essential hypertension, severe hearing impairment, breast cancer s/p mastectomy over 25 years ago per great niece, thoracolumbar scoliosis, and hypothyroidism  who presented to the ED via EMS from home for evaluation of garbled and dysarthric speech.  She did not receive IV t-PA due to late presentation (>4.5 hours from time of onset).  Stroke: L MCA patchy infarcts - embolic - source cryptogenic s/p thrombectomy L M2 near occlusion -> TICI2c -> Persistent severe stenosis -> stenting -> TICI3 CT head - No acute intracranial hemorrhage. Small acute left MCA territory infarction involving inferior insula and adjacent temporal lobe (ASPECT score is 8). CTA H&N - Early bifurcation of the left MCA with distal M1 and proximal M2 occlusion and subsequent partial reconstitution. High-grade stenosis or nonocclusive thrombus within anterior temporal branch approximately 5 mm beyond its origin just proximal to the occlusion.  CTP -  5/44, positive penumbra. IR - left M2 near occlusion -> TICI2c -> Persistent severe stenosis -> stenting -> TICI3 MRI head - Multifocal acute ischemic left MCA distribution infarcts involving the left parietal and temporal lobes.  MRA head - Interval placement of a vascular stent spanning the left M1-M2 segments. Markedly attenuated and reduced flow seen beyond the stent within the left MCA branches distally, raising the possibility for intra stent stenosis or other pathology. Fetal type origin of the PCAs with overall diminutive vertebrobasilar system. LE venous doppler - limited exam but no evidence of DVT 2D Echo - EF  60-65% Consider loop recorder if embolic work up negative.  Sars Corona Virus 2 - negative LDL - 139 HgbA1c - 5.9 UDS - negative VTE prophylaxis - SCDs No antithrombotic prior to admission, now on aspirin 81 mg daily and Brilinta (ticagrelor) 90 mg bid Patient will be counseled to be compliant with her antithrombotic medications Ongoing aggressive stroke risk factor management Therapy recommendations:  CIR Disposition:  Pending LDL 139 HgbA1c 5.9 VTE prophylaxis - SCD's     Diet   Diet regular Room service appropriate? Yes; Fluid consistency: Thin    Hypertension Home meds:  Cozaar ; HCTZ ; metoprolol Stable Long-term BP goal normotensive  Hyperlipidemia Home Lipid lowering medication: none  LDL 139, goal < 70 lipitor 40 Continue statin at discharge  Diabetes Mellitus Type 2  HGBA1c 5.9 SSI   Other Stroke Risk Factors Advanced Age >/= 63    Other Active Problems Hypothyroidism  - synthyroid daily  CKD stage 3  - Scr 1.07, (unknown base line Scr) Hyponatremia  - Na 133 down from 137 8/24. Will monitor  Anemia  - Hgb 11.3. will monitor   Hospital day # 3  Gevena Mart, NP  I have personally obtained history,examined this patient, reviewed notes, independently viewed imaging studies, participated in medical decision making and plan of  care.ROS completed by me personally and pertinent positives fully documented  I have made any additions or clarifications directly to the above note. Agree with note above.  Continue ongoing therapies and transfer to inpatient rehab when bed available.  Awaiting insurance authorization.  Greater than 50% time during this 25-minute visit was spent in counseling and coordination of care and discussion with care team.  No family available at bedside for discussion today  Delia Heady, MD Medical Director [facility] Pager: [phone] 11/22/2020 1:29 PM   To contact Stroke Continuity provider, please refer to [email or web address]. After hours, contact General Neurology

## 2020-11-22 NOTE — Plan of Care (Signed)
  Problem: Coping: Goal: Level of anxiety will decrease Outcome: Progressing   Problem: Pain Managment: Goal: General experience of comfort will improve Outcome: Progressing   Problem: Safety: Goal: Ability to remain free from injury will improve Outcome: Progressing   Problem: Skin Integrity: Goal: Risk for impaired skin integrity will decrease Outcome: Progressing   

## 2020-11-22 NOTE — Progress Notes (Signed)
TCD study completed.   Please see CV Proc for preliminary results.   Desree Leap, RDMS, RVT  

## 2020-11-22 NOTE — Progress Notes (Signed)
Inpatient Rehab Admissions Coordinator:   Awaiting determination from [facility] regarding CIR prior auth request.   Estill Dooms, PT, DPT Admissions Coordinator [phone] 11/22/20  12:15 PM

## 2020-11-23 ENCOUNTER — Other Ambulatory Visit: Payer: Self-pay

## 2020-11-23 ENCOUNTER — Encounter (HOSPITAL_COMMUNITY): Payer: Self-pay | Admitting: Neurology

## 2020-11-23 LAB — CBC
HCT: 37.4 % (ref 36.0–46.0)
Hemoglobin: 12.8 g/dL (ref 12.0–15.0)
MCH: 31.8 pg (ref 26.0–34.0)
MCHC: 34.2 g/dL (ref 30.0–36.0)
MCV: 92.8 fL (ref 80.0–100.0)
Platelets: 294 10*3/uL (ref 150–400)
RBC: 4.03 MIL/uL (ref 3.87–5.11)
RDW: 13.1 % (ref 11.5–15.5)
WBC: 9.7 10*3/uL (ref 4.0–10.5)
nRBC: 0 % (ref 0.0–0.2)

## 2020-11-23 LAB — GLUCOSE, CAPILLARY
Glucose-Capillary: 77 mg/dL (ref 70–99)
Glucose-Capillary: 74 mg/dL (ref 70–99)
Glucose-Capillary: 151 mg/dL — ABNORMAL HIGH (ref 70–99)
Glucose-Capillary: 58 mg/dL — ABNORMAL LOW (ref 70–99)
Glucose-Capillary: 79 mg/dL (ref 70–99)

## 2020-11-23 LAB — BASIC METABOLIC PANEL
Anion gap: 11 (ref 5–15)
BUN: 18 mg/dL (ref 8–23)
CO2: 23 mmol/L (ref 22–32)
Calcium: 8.8 mg/dL — ABNORMAL LOW (ref 8.9–10.3)
Chloride: 98 mmol/L (ref 98–111)
Creatinine, Ser: 1.12 mg/dL — ABNORMAL HIGH (ref 0.44–1.00)
GFR, Estimated: 47 mL/min — ABNORMAL LOW (ref >60)
Glucose, Bld: 88 mg/dL (ref 70–99)
Potassium: 3.4 mmol/L — ABNORMAL LOW (ref 3.5–5.1)
Sodium: 132 mmol/L — ABNORMAL LOW (ref 135–145)

## 2020-11-23 MED ORDER — POTASSIUM CHLORIDE CRYS ER 20 MEQ PO TBCR
40.0000 meq | EXTENDED_RELEASE_TABLET | Freq: Once | ORAL | Status: AC
  Administered 2020-11-23: 40 meq via ORAL
  Filled 2020-11-23: qty 2

## 2020-11-23 NOTE — Progress Notes (Addendum)
STROKE TEAM PROGRESS NOTE   INTERVAL HISTORY Nice  is at bedside.  From strokes perspective neuro exam appears unchanged, patient is waiting CIR, patient and daughter will consider outpatient loop,Neuro exam appears unchanged from yesterday. PT/OT recommending CIR.  Vitals:   11/22/20 1943 11/22/20 2332 11/23/20 0300 11/23/20 0820  BP: (!) 151/82 138/70 110/79 (!) 146/59  Pulse: 68 71 92 72  Resp: 18 18 18    Temp: 98.1 F (36.7 C) 98.3 F (36.8 C) 98.6 F (37 C)   TempSrc: Oral Oral Oral   SpO2: 95% 98% 98%   Weight:       CBC:  Recent Labs  Lab 11/19/20 1401 11/19/20 1422 11/22/20 0122 11/23/20 0139  WBC 6.9   < > 8.1 9.7  NEUTROABS 5.2  --   --   --   HGB 12.5   < > 11.3* 12.8  HCT 36.7   < > 33.1* 37.4  MCV 94.8   < > 93.8 92.8  PLT 227   < > 212 294   < > = values in this interval not displayed.   Basic Metabolic Panel:  Recent Labs  Lab 11/22/20 0122 11/23/20 0139  NA 133* 132*  K 3.9 3.4*  CL 100 98  CO2 25 23  GLUCOSE 90 88  BUN 13 18  CREATININE 1.07* 1.12*  CALCIUM 8.8* 8.8*   Lipid Panel:  Recent Labs  Lab 11/20/20 0446  CHOL 205*  TRIG 91  HDL 48  CHOLHDL 4.3  VLDL 18  LDLCALC 11/22/20*   HgbA1c:  Recent Labs  Lab 11/20/20 0446  HGBA1C 5.9*   Urine Drug Screen:  Recent Labs  Lab 11/19/20 1831  LABOPIA NONE DETECTED  COCAINSCRNUR NONE DETECTED  LABBENZ NONE DETECTED  AMPHETMU NONE DETECTED  THCU NONE DETECTED  LABBARB NONE DETECTED    Alcohol Level  Recent Labs  Lab 11/19/20 1401  ETH <10    IMAGING past 24 hours No results found.  PHYSICAL EXAM  Temp:  [98.1 F (36.7 C)-98.6 F (37 C)] 98.6 F (37 C) (08/27 0300) Pulse Rate:  [67-92] 72 (08/27 0820) Resp:  [18] 18 (08/27 0300) BP: (110-151)/(59-82) 146/59 (08/27 0820) SpO2:  [95 %-98 %] 98 % (08/27 0300)  General - Well nourished, well developed, in no apparent distress.  Ophthalmologic - normal sclera  Cardiovascular - Regular rhythm and rate.  Mental Status  -  Level of arousal- she is awake and alert, with improved aphasia. Very pleasant. Able to say periodically say short sentences. Can follow simple commands if helped with gestures.  Cranial Nerves II - XII - II - Visual field intact OU. Blinks to threat III, IV, VI - Extraocular movements intact, can track  V - Facial sensation intact bilaterally. VII - Facial movement intact bilaterally. VIII - Hearing & vestibular intact bilaterally. X - Palate elevates symmetrically. XII - not able to assess  Motor Strength - Able to move all 4 extremities spontaneously, bilateral uppers and lowers 4/5. Bulk was normal and fasciculations were absent.  No focal deficits noted. Transferred to bedside commode with assistance.  Motor Tone - Muscle tone was assessed at the neck and appendages and was normal.  Sensory - responds to noxious stimuli  Coordination - Unable to assess .  Gait and Station - deferred.   ASSESSMENT/PLAN Ms. SAMIYYAH MOFFA is a 87 y.o. female with history of essential hypertension, severe hearing impairment, breast cancer s/p mastectomy over 25 years ago per great niece, thoracolumbar scoliosis,  and hypothyroidism  who presented to the ED via EMS from home for evaluation of garbled and dysarthric speech.  She did not receive IV t-PA due to late presentation (>4.5 hours from time of onset).  Stroke: L MCA patchy infarcts - embolic - source cryptogenic s/p thrombectomy L M2 near occlusion -> TICI2c -> Persistent severe stenosis -> stenting -> TICI3 CT head - No acute intracranial hemorrhage. Small acute left MCA territory infarction involving inferior insula and adjacent temporal lobe (ASPECT score is 8). CTA H&N - Early bifurcation of the left MCA with distal M1 and proximal M2 occlusion and subsequent partial reconstitution. High-grade stenosis or nonocclusive thrombus within anterior temporal branch approximately 5 mm beyond its origin just proximal to the occlusion.  CTP - 5/44,  positive penumbra. IR - left M2 near occlusion -> TICI2c -> Persistent severe stenosis -> stenting -> TICI3 MRI head - Multifocal acute ischemic left MCA distribution infarcts involving the left parietal and temporal lobes.  MRA head - Interval placement of a vascular stent spanning the left M1-M2 segments. Markedly attenuated and reduced flow seen beyond the stent within the left MCA branches distally, raising the possibility for intra stent stenosis or other pathology. Fetal type origin of the PCAs with overall diminutive vertebrobasilar system. LE venous doppler - limited exam but no evidence of DVT 2D Echo - EF 60-65% Consider loop recorder if embolic work up negative. Still considering, awaiting CIR, to discuss outpatient. Sars Corona Virus 2 - negative LDL - 139 HgbA1c - 5.9 UDS - negative VTE prophylaxis - SCDs No antithrombotic prior to admission, now on aspirin 81 mg daily and Brilinta (ticagrelor) 90 mg bid Patient will be counseled to be compliant with her antithrombotic medications Ongoing aggressive stroke risk factor management Therapy recommendations:  CIR Disposition:  Pending LDL 139 HgbA1c 5.9 VTE prophylaxis - SCD's     Diet   Diet regular Room service appropriate? Yes; Fluid consistency: Thin    Hypertension Home meds:  Cozaar ; HCTZ ; metoprolol Stable Long-term BP goal normotensive  Hyperlipidemia Home Lipid lowering medication: none  LDL 139, goal < 70 Start lipitor 40 Continue statin at discharge  Diabetes Mellitus Type 2  HGBA1c 5.9 SSI   Other Stroke Risk Factors Advanced Age >/= 48    Other Active Problems Hypothyroidism  - synthyroid daily  CKD stage 3  - Scr 1.12, (unknown base line Scr), drinking fluids, monitor Hyponatremia  - Na 132 down from 137 8/27. Will monitor. Encourage fluids. Anemia  - Hgb 12.8 resolved. will monitor   Hospital day # 4  Personally examined patient and images, and have participated in and made any  corrections needed to history, physical, neuro exam,assessment and plan as stated above.  I have personally obtained the history, evaluated lab date, reviewed imaging studies and agree with radiology interpretations.    Naomie Dean, MD Stroke Neurology  I spent 30 minutes of face-to-face and non-face-to-face time with patient. This included prechart review, lab review, study review, order entry, electronic health record documentation, patient education on the different diagnostic and therapeutic options, counseling and coordination of care, risks and benefits of management, compliance, or risk factor reduction   To contact Stroke Continuity provider, please refer to [email or web address]. After hours, contact General Neurology

## 2020-11-24 ENCOUNTER — Inpatient Hospital Stay (HOSPITAL_COMMUNITY): Payer: Medicare HMO

## 2020-11-24 LAB — CBC
HCT: 37.5 % (ref 36.0–46.0)
Hemoglobin: 12.3 g/dL (ref 12.0–15.0)
MCH: 31.5 pg (ref 26.0–34.0)
MCHC: 32.8 g/dL (ref 30.0–36.0)
MCV: 95.9 fL (ref 80.0–100.0)
Platelets: 322 10*3/uL (ref 150–400)
RBC: 3.91 MIL/uL (ref 3.87–5.11)
RDW: 13.4 % (ref 11.5–15.5)
WBC: 12.2 10*3/uL — ABNORMAL HIGH (ref 4.0–10.5)
nRBC: 0 % (ref 0.0–0.2)

## 2020-11-24 LAB — BASIC METABOLIC PANEL
Anion gap: 11 (ref 5–15)
BUN: 20 mg/dL (ref 8–23)
CO2: 24 mmol/L (ref 22–32)
Calcium: 9.5 mg/dL (ref 8.9–10.3)
Chloride: 102 mmol/L (ref 98–111)
Creatinine, Ser: 1.12 mg/dL — ABNORMAL HIGH (ref 0.44–1.00)
GFR, Estimated: 47 mL/min — ABNORMAL LOW (ref >60)
Glucose, Bld: 152 mg/dL — ABNORMAL HIGH (ref 70–99)
Potassium: 4.3 mmol/L (ref 3.5–5.1)
Sodium: 137 mmol/L (ref 135–145)

## 2020-11-24 LAB — GLUCOSE, CAPILLARY
Glucose-Capillary: 103 mg/dL — ABNORMAL HIGH (ref 70–99)
Glucose-Capillary: 108 mg/dL — ABNORMAL HIGH (ref 70–99)
Glucose-Capillary: 163 mg/dL — ABNORMAL HIGH (ref 70–99)
Glucose-Capillary: 185 mg/dL — ABNORMAL HIGH (ref 70–99)
Glucose-Capillary: 89 mg/dL (ref 70–99)

## 2020-11-24 MED ORDER — SODIUM CHLORIDE 0.9 % IV SOLN: INTRAVENOUS | Status: DC

## 2020-11-24 NOTE — [place] FL2 (Addendum)
[place] MEDICAID FL2 LEVEL OF CARE SCREENING TOOL     IDENTIFICATION  Patient Name: Maria Dyer Birthdate: March 20, 1931 Sex: female Admission Date (Current Location): 11/19/2020  [facility] and [place] Number:  Producer, television/film/video and Address:  [facility] [place], [address], [place], [place] [address]      Provider Number: [id number]  Attending Physician Name and Address:  Stroke, Md, MD  Relative Name and Phone Number:  Jeannett Senior [phone]    Current Level of Care: Hospital Recommended Level of Care: Skilled Nursing Facility Prior Approval Number:    Date Approved/Denied: 11/24/20 PASRR Number: [id number]  Discharge Plan: SNF    Current Diagnoses: Patient Active Problem List   Diagnosis Date Noted   Acute ischemic left MCA stroke (HCC) 11/19/2020   Pressure injury of skin 11/19/2020    Orientation RESPIRATION BLADDER Height & Weight        Normal Incontinent Weight: 140 lb 10.5 oz (63.8 kg) Height:  5\' 9"  (175.3 cm)  BEHAVIORAL SYMPTOMS/MOOD NEUROLOGICAL BOWEL NUTRITION STATUS      Incontinent Diet (fluid consistency thin)  AMBULATORY STATUS COMMUNICATION OF NEEDS Skin   Extensive Assist Verbally Normal                       Personal Care Assistance Level of Assistance  Bathing, Feeding, Dressing Bathing Assistance: Maximum assistance Feeding assistance: Limited assistance Dressing Assistance: Maximum assistance     Functional Limitations Info  Hearing   Hearing Info: Impaired      SPECIAL CARE FACTORS FREQUENCY  PT (By licensed PT), OT (By licensed OT)     PT Frequency: 5x weekly OT Frequency: 5x weekly            Contractures Contractures Info: Not present    Additional Factors Info  Code Status, Allergies Code Status Info: Full Allergies Info: NKDA           Current Medications (11/24/2020):  This is the current hospital active medication list Current Facility-Administered Medications   Medication Dose Route Frequency Provider Last Rate Last Admin   acetaminophen (TYLENOL) tablet 650 mg  650 mg Oral Q4H PRN 11/26/2020, NP   650 mg at 11/22/20 2249   Or   acetaminophen (TYLENOL) 160 MG/5ML solution 650 mg  650 mg Per Tube Q4H PRN 2250, NP       Or   acetaminophen (TYLENOL) suppository 650 mg  650 mg Rectal Q4H PRN Kara Mead, NP       aspirin chewable tablet 81 mg  81 mg Oral Daily de Kara Mead, Melchor Amour, MD   81 mg at 11/24/20 11/26/20   Or   aspirin chewable tablet 81 mg  81 mg Per Tube Daily de 4287, Melchor Amour, MD       atorvastatin (LIPITOR) tablet 40 mg  40 mg Oral Daily Jerilynn Mages, MD   40 mg at 11/24/20 0941   Chlorhexidine Gluconate Cloth 2 % PADS 6 each  6 each Topical Daily 11/26/20, MD   6 each at 11/24/20 0943   feeding supplement (ENSURE ENLIVE / ENSURE PLUS) liquid 237 mL  237 mL Oral BID BM 11/26/20, MD   237 mL at 11/24/20 0943   labetalol (NORMODYNE) injection 5-20 mg  5-20 mg Intravenous Q2H PRN 12-29-1994, MD   10 mg at 11/21/20 1523   levothyroxine (SYNTHROID) tablet 50 mcg  50 mcg Oral Daily Bhagat, 11/23/20, MD  50 mcg at 11/24/20 0941   losartan (COZAAR) tablet 100 mg  100 mg Oral Daily Micki Riley, MD   100 mg at 11/24/20 0941   metoprolol succinate (TOPROL-XL) 24 hr tablet 50 mg  50 mg Oral Daily Micki Riley, MD   50 mg at 11/24/20 0941   senna-docusate (Senokot-S) tablet 1 tablet  1 tablet Oral QHS PRN Kara Mead, NP       ticagrelor (BRILINTA) tablet 90 mg  90 mg Oral BID Bhagat, Srishti L, MD   90 mg at 11/24/20 0941   Or   ticagrelor (BRILINTA) tablet 90 mg  90 mg Per Tube BID Bhagat, Srishti L, MD       Zinc Oxide (TRIPLE PASTE) 12.8 % ointment   Topical PRN Marvel Plan, MD   Given at 11/22/20 1827     Discharge Medications: Please see discharge summary for a list of discharge medications.  Relevant Imaging Results:  Relevant Lab Results:   Additional Information SSN  [id number]  Annalee Genta, [place]  I have personally obtained history,examined this patient, reviewed notes, independently viewed imaging studies, participated in medical decision making and plan of care.ROS completed by me personally and pertinent positives fully documented  I have made any additions or clarifications directly to the above note. Agree with note above.    Delia Heady, MD Medical Director [facility] Pager: [phone] 11/25/2020 8:23 AM

## 2020-11-24 NOTE — TOC Initial Note (Signed)
Transition of Care [facility]) - Initial/Assessment Note    Patient Details  Name: Maria Dyer MRN: [id number] Date of Birth: 11-28-1930  Transition of [facility]) CM/SW Contact:    Annalee Genta, LCSW Phone Number: 11/24/2020, 1:03 PM  Clinical Narrative:                 CSW spoke with patient's niece Duwayne Heck and informed her insurance Berkley Harvey is still pending. CSW discussed with her referrals to SNFs as a back-up if the insurance auth does not occur and if appealed not successful. CSW informed niece the primary plan is inpatient and that is the goal but having these referrals in now would provide more options if insurance issues arise. CSW noted family is open to the SNF referrals as a back-up option only and want to focus on CIR pending insurance authorization.   Expected Discharge Plan: IP Rehab Facility Barriers to Discharge: Insurance Authorization   Patient Goals and CMS Choice   CMS Medicare.gov Compare Post Acute Care list provided to:: Patient Represenative (must comment) Choice offered to / list presented to :  (Adult niece)  Expected Discharge Plan and Services Expected Discharge Plan: IP Rehab Facility     Post Acute Care Choice: IP Rehab                                        Prior Living Arrangements/Services     Patient language and need for interpreter reviewed:: Yes        Need for Family Participation in Patient Care: Yes (Comment) Care giver support system in place?: No (comment)   Criminal Activity/Legal Involvement Pertinent to Current Situation/Hospitalization: No - Comment as needed  Activities of Daily Living Home Assistive Devices/Equipment: Walker (specify type) ADL Screening (condition at time of admission) Patient's cognitive ability adequate to safely complete daily activities?: No Is the patient deaf or have difficulty hearing?: No Does the patient have difficulty seeing, even when wearing glasses/contacts?: No Does the patient have  difficulty concentrating, remembering, or making decisions?: Yes Patient able to express need for assistance with ADLs?: No Does the patient have difficulty dressing or bathing?: Yes Independently performs ADLs?: No Communication: Needs assistance Is this a change from baseline?: Change from baseline, expected to last >3 days Dressing (OT): Needs assistance Is this a change from baseline?: Change from baseline, expected to last >3 days Grooming: Needs assistance Is this a change from baseline?: Change from baseline, expected to last >3 days Feeding: Independent Bathing: Needs assistance Is this a change from baseline?: Change from baseline, expected to last >3 days Toileting: Needs assistance Is this a change from baseline?: Change from baseline, expected to last >3days In/Out Bed: Needs assistance Is this a change from baseline?: Change from baseline, expected to last >3 days Walks in Home: Needs assistance Is this a change from baseline?: Change from baseline, expected to last >3 days Does the patient have difficulty walking or climbing stairs?: Yes Weakness of Legs: Both Weakness of Arms/Hands: None  Permission Sought/Granted Permission sought to share information with : Facility Medical sales representative, Family Supports Permission granted to share information with : Yes, Verbal Permission Granted              Emotional Assessment              Admission diagnosis:  Aphasia [R47.01] Acute ischemic left MCA stroke [facility]) [H41.937] Patient Active  Problem List   Diagnosis Date Noted   Acute ischemic left MCA stroke (HCC) 11/19/2020   Pressure injury of skin 11/19/2020   PCP:  Georgianne Fick, MD Pharmacy:  No Pharmacies Listed    Social Determinants of Health (SDOH) Interventions    Readmission Risk Interventions No flowsheet data found.

## 2020-11-24 NOTE — Progress Notes (Signed)
STROKE TEAM PROGRESS NOTE   INTERVAL HISTORY Nice  is at bedside.  From strokes perspective neuro exam appears unchanged, patient is waiting CIR, patient and daughter will consider outpatient loop,Neuro exam appears unchanged from yesterday. PT/OT recommending CIR. She is awake, pleasant, aphasic. WBCs slightly elevated, will check urine and cxr.  Vitals:   11/24/20 0033 11/24/20 0404 11/24/20 0829 11/24/20 1228  BP: (!) 150/77 (!) 157/54 (!) 149/62 131/61  Pulse: 87 68 75 80  Resp: 18 19    Temp:  98.7 F (37.1 C) 97.9 F (36.6 C) (!) 97.5 F (36.4 C)  TempSrc:   Oral Oral  SpO2: 100% 97% 100% 100%  Weight:      Height:       CBC:  Recent Labs  Lab 11/19/20 1401 11/19/20 1422 11/23/20 0139 11/24/20 0129  WBC 6.9   < > 9.7 12.2*  NEUTROABS 5.2  --   --   --   HGB 12.5   < > 12.8 12.3  HCT 36.7   < > 37.4 37.5  MCV 94.8   < > 92.8 95.9  PLT 227   < > 294 322   < > = values in this interval not displayed.   Basic Metabolic Panel:  Recent Labs  Lab 11/23/20 0139 11/24/20 0129  NA 132* 137  K 3.4* 4.3  CL 98 102  CO2 23 24  GLUCOSE 88 152*  BUN 18 20  CREATININE 1.12* 1.12*  CALCIUM 8.8* 9.5   Lipid Panel:  Recent Labs  Lab 11/20/20 0446  CHOL 205*  TRIG 91  HDL 48  CHOLHDL 4.3  VLDL 18  LDLCALC 585*   HgbA1c:  Recent Labs  Lab 11/20/20 0446  HGBA1C 5.9*   Urine Drug Screen:  Recent Labs  Lab 11/19/20 1831  LABOPIA NONE DETECTED  COCAINSCRNUR NONE DETECTED  LABBENZ NONE DETECTED  AMPHETMU NONE DETECTED  THCU NONE DETECTED  LABBARB NONE DETECTED    Alcohol Level  Recent Labs  Lab 11/19/20 1401  ETH <10    IMAGING past 24 hours DG Chest 1 View  Result Date: 11/24/2020 CLINICAL DATA:  Aspiration into airway. EXAM: CHEST  1 VIEW COMPARISON:  None. FINDINGS: Scoliotic curvature of the thoracolumbar spine. No pneumothorax. Healed left rib fractures. The heart, hila, and mediastinum are unremarkable with no acute abnormalities. Calcified  atherosclerosis is seen in the thoracic aorta. No pulmonary nodules or masses. No focal infiltrates. No acute abnormalities. IMPRESSION: No active disease. Electronically Signed   By: Gerome Sam III M.D.   On: 11/24/2020 13:36    PHYSICAL EXAM  Temp:  [97.5 F (36.4 C)-98.7 F (37.1 C)] 97.5 F (36.4 C) (08/28 1228) Pulse Rate:  [68-87] 80 (08/28 1228) Resp:  [17-19] 19 (08/28 0404) BP: (125-161)/(54-77) 131/61 (08/28 1228) SpO2:  [94 %-100 %] 100 % (08/28 1228)  General - Well nourished, well developed, in no apparent distress.  Ophthalmologic - normal sclera  Cardiovascular - Regular rhythm and rate.  Mental Status -  Level of arousal- she is awake and alert, with global aphasia. Very pleasant.  Can follow simple commands if helped with gestures.  Cranial Nerves II - XII - II - Visual field intact OU. Blinks to threat III, IV, VI - Extraocular movements intact, can track  V - Facial sensation intact bilaterally. VII - Facial movement intact bilaterally. VIII - Hearing & vestibular intact bilaterally. X - Palate elevates symmetrically. XII - not able to assess  Motor Strength - Able  to move all 4 extremities spontaneously, bilateral uppers and lowers 4/5. Bulk was normal and fasciculations were absent.  No focal deficits noted. Transferred to bedside commode with assistance.  Motor Tone - Muscle tone was assessed at the neck and appendages and was normal.  Sensory - responds to noxious stimuli  Coordination - Unable to assess .  Gait and Station - deferred.   ASSESSMENT/PLAN [name] is a 87 y.o. female with history of essential hypertension, severe hearing impairment, breast cancer s/p mastectomy over 25 years ago per great niece, thoracolumbar scoliosis, and hypothyroidism  who presented to the ED via EMS from home for evaluation of garbled and dysarthric speech.  She did not receive IV t-PA due to late presentation (>4.5 hours from time of  onset).  Stroke: L MCA patchy infarcts - embolic - source cryptogenic s/p thrombectomy L M2 near occlusion -> TICI2c -> Persistent severe stenosis -> stenting -> TICI3 CT head - No acute intracranial hemorrhage. Small acute left MCA territory infarction involving inferior insula and adjacent temporal lobe (ASPECT score is 8). CTA H&N - Early bifurcation of the left MCA with distal M1 and proximal M2 occlusion and subsequent partial reconstitution. High-grade stenosis or nonocclusive thrombus within anterior temporal branch approximately 5 mm beyond its origin just proximal to the occlusion.  CTP - 5/44, positive penumbra. IR - left M2 near occlusion -> TICI2c -> Persistent severe stenosis -> stenting -> TICI3 MRI head - Multifocal acute ischemic left MCA distribution infarcts involving the left parietal and temporal lobes.  MRA head - Interval placement of a vascular stent spanning the left M1-M2 segments. Markedly attenuated and reduced flow seen beyond the stent within the left MCA branches distally, raising the possibility for intra stent stenosis or other pathology. Fetal type origin of the PCAs with overall diminutive vertebrobasilar system. LE venous doppler - limited exam but no evidence of DVT 2D Echo - EF 60-65% Consider loop recorder if embolic work up negative. Still considering, awaiting CIR, to discuss outpatient. Sars Corona Virus 2 - negative LDL - 139 HgbA1c - 5.9 UDS - negative VTE prophylaxis - SCDs No antithrombotic prior to admission, now on aspirin 81 mg daily and Brilinta (ticagrelor) 90 mg bid Patient will be counseled to be compliant with her antithrombotic medications Ongoing aggressive stroke risk factor management Therapy recommendations:  CIR Disposition:  Pending LDL 139 HgbA1c 5.9 VTE prophylaxis - SCD's     Diet   Diet regular Room service appropriate? Yes; Fluid consistency: Thin    Hypertension Home meds:  Cozaar ; HCTZ ; metoprolol Stable Long-term  BP goal normotensive  Hyperlipidemia Home Lipid lowering medication: none  LDL 139, goal < 70 Start lipitor 40 Continue statin at discharge  Diabetes Mellitus Type 2  HGBA1c 5.9 SSI   Other Stroke Risk Factors Advanced Age >/= 1    Other Active Problems Hypothyroidism  - synthyroid daily  CKD stage 3  - Scr 1.12, (unknown base line Scr), drinking fluids, monitor Hyponatremia  - Na 8/27 132 down now 137 8/28. Will monitor. Encourage fluids. Anemia  Hgb 12.8 resolved. will monitor  Elevated WBCs will order urine and cxr, afebrile   Hospital day # 5 Personally examined patient and images, and have participated in and made any corrections needed to history, physical, neuro exam,assessment and plan as stated above.  I have personally obtained the history, evaluated lab date, reviewed imaging studies and agree with radiology interpretations.    Naomie Dean, MD Stroke  Neurology  I spent 15 minutes of face-to-face and non-face-to-face time with patient. This included prechart review, lab review, study review, order entry, electronic health record documentation, patient education on the different diagnostic and therapeutic options, counseling and coordination of care, risks and benefits of management, compliance, or risk factor reduction    To contact Stroke Continuity provider, please refer to [email or web address]. After hours, contact General Neurology

## 2020-11-25 ENCOUNTER — Other Ambulatory Visit: Payer: Self-pay

## 2020-11-25 ENCOUNTER — Inpatient Hospital Stay (HOSPITAL_COMMUNITY)
Admission: RE | Admit: 2020-11-25 | Discharge: 2020-12-12 | DRG: 092 | Disposition: A | Discharge status: Home Health | Payer: Medicare HMO | Source: Intra-hospital | Attending: Physical Medicine and Rehabilitation | Admitting: Physical Medicine and Rehabilitation

## 2020-11-25 ENCOUNTER — Encounter (HOSPITAL_COMMUNITY): Payer: Self-pay | Admitting: Physical Medicine and Rehabilitation

## 2020-11-25 DIAGNOSIS — R2689 Other abnormalities of gait and mobility: Principal | ICD-10-CM | POA: Diagnosis present

## 2020-11-25 DIAGNOSIS — Z853 Personal history of malignant neoplasm of breast: Secondary | ICD-10-CM

## 2020-11-25 DIAGNOSIS — H9190 Unspecified hearing loss, unspecified ear: Secondary | ICD-10-CM | POA: Diagnosis present

## 2020-11-25 DIAGNOSIS — L89312 Pressure ulcer of right buttock, stage 2: Secondary | ICD-10-CM | POA: Diagnosis present

## 2020-11-25 DIAGNOSIS — I63512 Cerebral infarction due to unspecified occlusion or stenosis of left middle cerebral artery: Secondary | ICD-10-CM

## 2020-11-25 DIAGNOSIS — K5900 Constipation, unspecified: Secondary | ICD-10-CM | POA: Diagnosis present

## 2020-11-25 DIAGNOSIS — N179 Acute kidney failure, unspecified: Secondary | ICD-10-CM | POA: Diagnosis present

## 2020-11-25 DIAGNOSIS — Z7902 Long term (current) use of antithrombotics/antiplatelets: Secondary | ICD-10-CM

## 2020-11-25 DIAGNOSIS — L89322 Pressure ulcer of left buttock, stage 2: Secondary | ICD-10-CM | POA: Diagnosis not present

## 2020-11-25 DIAGNOSIS — Z79890 Hormone replacement therapy (postmenopausal): Secondary | ICD-10-CM

## 2020-11-25 DIAGNOSIS — N390 Urinary tract infection, site not specified: Secondary | ICD-10-CM | POA: Diagnosis present

## 2020-11-25 DIAGNOSIS — Z7982 Long term (current) use of aspirin: Secondary | ICD-10-CM

## 2020-11-25 DIAGNOSIS — Z79899 Other long term (current) drug therapy: Secondary | ICD-10-CM

## 2020-11-25 DIAGNOSIS — I69320 Aphasia following cerebral infarction: Secondary | ICD-10-CM | POA: Diagnosis not present

## 2020-11-25 DIAGNOSIS — K0889 Other specified disorders of teeth and supporting structures: Secondary | ICD-10-CM | POA: Diagnosis not present

## 2020-11-25 DIAGNOSIS — R4189 Other symptoms and signs involving cognitive functions and awareness: Secondary | ICD-10-CM | POA: Diagnosis present

## 2020-11-25 DIAGNOSIS — Z888 Allergy status to other drugs, medicaments and biological substances status: Secondary | ICD-10-CM

## 2020-11-25 DIAGNOSIS — R5383 Other fatigue: Secondary | ICD-10-CM | POA: Diagnosis not present

## 2020-11-25 DIAGNOSIS — R32 Unspecified urinary incontinence: Secondary | ICD-10-CM | POA: Diagnosis present

## 2020-11-25 DIAGNOSIS — Z23 Encounter for immunization: Secondary | ICD-10-CM

## 2020-11-25 DIAGNOSIS — Z20822 Contact with and (suspected) exposure to covid-19: Secondary | ICD-10-CM | POA: Diagnosis not present

## 2020-11-25 DIAGNOSIS — L8990 Pressure ulcer of unspecified site, unspecified stage: Secondary | ICD-10-CM | POA: Diagnosis not present

## 2020-11-25 DIAGNOSIS — I10 Essential (primary) hypertension: Secondary | ICD-10-CM | POA: Diagnosis present

## 2020-11-25 DIAGNOSIS — E039 Hypothyroidism, unspecified: Secondary | ICD-10-CM | POA: Diagnosis present

## 2020-11-25 DIAGNOSIS — E785 Hyperlipidemia, unspecified: Secondary | ICD-10-CM | POA: Diagnosis present

## 2020-11-25 DIAGNOSIS — Z9010 Acquired absence of unspecified breast and nipple: Secondary | ICD-10-CM | POA: Diagnosis not present

## 2020-11-25 LAB — CBC WITH DIFFERENTIAL/PLATELET
Abs Immature Granulocytes: 0.04 10*3/uL (ref 0.00–0.07)
Basophils Absolute: 0.1 10*3/uL (ref 0.0–0.1)
Basophils Relative: 1 %
Eosinophils Absolute: 0.4 10*3/uL (ref 0.0–0.5)
Eosinophils Relative: 5 %
HCT: 32.7 % — ABNORMAL LOW (ref 36.0–46.0)
Hemoglobin: 11 g/dL — ABNORMAL LOW (ref 12.0–15.0)
Immature Granulocytes: 1 %
Lymphocytes Relative: 11 %
Lymphs Abs: 1 10*3/uL (ref 0.7–4.0)
MCH: 32 pg (ref 26.0–34.0)
MCHC: 33.6 g/dL (ref 30.0–36.0)
MCV: 95.1 fL (ref 80.0–100.0)
Monocytes Absolute: 0.8 10*3/uL (ref 0.1–1.0)
Monocytes Relative: 9 %
Neutro Abs: 6.2 10*3/uL (ref 1.7–7.7)
Neutrophils Relative %: 73 %
Platelets: 275 10*3/uL (ref 150–400)
RBC: 3.44 MIL/uL — ABNORMAL LOW (ref 3.87–5.11)
RDW: 13.5 % (ref 11.5–15.5)
WBC: 8.5 10*3/uL (ref 4.0–10.5)
nRBC: 0 % (ref 0.0–0.2)

## 2020-11-25 LAB — URINALYSIS, ROUTINE W REFLEX MICROSCOPIC
Bilirubin Urine: NEGATIVE
Glucose, UA: NEGATIVE mg/dL
Ketones, ur: NEGATIVE mg/dL
Nitrite: NEGATIVE
Protein, ur: NEGATIVE mg/dL
Specific Gravity, Urine: 1.014 (ref 1.005–1.030)
pH: 5 (ref 5.0–8.0)

## 2020-11-25 LAB — COMPREHENSIVE METABOLIC PANEL
ALT: 16 U/L (ref 0–44)
AST: 19 U/L (ref 15–41)
Albumin: 2.8 g/dL — ABNORMAL LOW (ref 3.5–5.0)
Alkaline Phosphatase: 46 U/L (ref 38–126)
Anion gap: 8 (ref 5–15)
BUN: 26 mg/dL — ABNORMAL HIGH (ref 8–23)
CO2: 22 mmol/L (ref 22–32)
Calcium: 8.8 mg/dL — ABNORMAL LOW (ref 8.9–10.3)
Chloride: 104 mmol/L (ref 98–111)
Creatinine, Ser: 1.21 mg/dL — ABNORMAL HIGH (ref 0.44–1.00)
GFR, Estimated: 43 mL/min — ABNORMAL LOW (ref >60)
Glucose, Bld: 107 mg/dL — ABNORMAL HIGH (ref 70–99)
Potassium: 4 mmol/L (ref 3.5–5.1)
Sodium: 134 mmol/L — ABNORMAL LOW (ref 135–145)
Total Bilirubin: 0.9 mg/dL (ref 0.3–1.2)
Total Protein: 5.8 g/dL — ABNORMAL LOW (ref 6.5–8.1)

## 2020-11-25 LAB — GLUCOSE, CAPILLARY
Glucose-Capillary: 110 mg/dL — ABNORMAL HIGH (ref 70–99)
Glucose-Capillary: 133 mg/dL — ABNORMAL HIGH (ref 70–99)
Glucose-Capillary: 102 mg/dL — ABNORMAL HIGH (ref 70–99)

## 2020-11-25 MED ORDER — ENSURE ENLIVE PO LIQD: 237.0000 mL | Freq: Two times a day (BID) | ORAL | 12 refills | Status: DC

## 2020-11-25 MED ORDER — TICAGRELOR 90 MG PO TABS: 90.0000 mg | ORAL_TABLET | Freq: Two times a day (BID) | ORAL | Status: DC

## 2020-11-25 MED ORDER — METOPROLOL SUCCINATE ER 50 MG PO TB24
50.0000 mg | ORAL_TABLET | Freq: Every day | ORAL | Status: DC
  Administered 2020-11-26 – 2020-12-12 (×17): 50 mg via ORAL
  Filled 2020-11-25 (×17): qty 1

## 2020-11-25 MED ORDER — ASPIRIN 81 MG PO CHEW: 81.0000 mg | CHEWABLE_TABLET | Freq: Every day | ORAL | Status: DC

## 2020-11-25 MED ORDER — ENSURE ENLIVE PO LIQD
237.0000 mL | Freq: Two times a day (BID) | ORAL | Status: DC
  Administered 2020-11-26: 237 mL via ORAL

## 2020-11-25 MED ORDER — ATORVASTATIN CALCIUM 40 MG PO TABS: 40.0000 mg | ORAL_TABLET | Freq: Every day | ORAL | Status: DC

## 2020-11-25 MED ORDER — ASPIRIN 81 MG PO CHEW
81.0000 mg | CHEWABLE_TABLET | Freq: Every day | ORAL | Status: DC
  Administered 2020-11-26 – 2020-12-12 (×17): 81 mg via ORAL
  Filled 2020-11-25 (×17): qty 1

## 2020-11-25 MED ORDER — ACETAMINOPHEN 325 MG PO TABS
650.0000 mg | ORAL_TABLET | ORAL | Status: DC | PRN
  Administered 2020-12-05 – 2020-12-09 (×6): 650 mg via ORAL
  Filled 2020-11-25 (×6): qty 2

## 2020-11-25 MED ORDER — ATORVASTATIN CALCIUM 40 MG PO TABS
40.0000 mg | ORAL_TABLET | Freq: Every day | ORAL | Status: DC
  Administered 2020-11-26 – 2020-12-12 (×17): 40 mg via ORAL
  Filled 2020-11-25 (×17): qty 1

## 2020-11-25 MED ORDER — LOSARTAN POTASSIUM 50 MG PO TABS
100.0000 mg | ORAL_TABLET | Freq: Every day | ORAL | Status: DC
  Administered 2020-11-26 – 2020-12-12 (×17): 100 mg via ORAL
  Filled 2020-11-25 (×17): qty 2

## 2020-11-25 MED ORDER — ACETAMINOPHEN 160 MG/5ML PO SOLN
650.0000 mg | ORAL | Status: DC | PRN
Start: 2020-11-25 — End: 2020-11-26

## 2020-11-25 MED ORDER — ACETAMINOPHEN 650 MG RE SUPP: 650.0000 mg | RECTAL | Status: DC | PRN

## 2020-11-25 MED ORDER — LEVOTHYROXINE SODIUM 25 MCG PO TABS
50.0000 ug | ORAL_TABLET | Freq: Every day | ORAL | Status: DC
  Administered 2020-11-26 – 2020-12-12 (×17): 50 ug via ORAL
  Filled 2020-11-25 (×18): qty 2

## 2020-11-25 MED ORDER — ZINC OXIDE 12.8 % EX OINT
TOPICAL_OINTMENT | CUTANEOUS | Status: DC | PRN
  Filled 2020-11-25 (×2): qty 56.7

## 2020-11-25 MED ORDER — TICAGRELOR 90 MG PO TABS
90.0000 mg | ORAL_TABLET | Freq: Two times a day (BID) | ORAL | Status: DC
  Filled 2020-11-25: qty 1

## 2020-11-25 MED ORDER — ASPIRIN 81 MG PO CHEW
81.0000 mg | CHEWABLE_TABLET | Freq: Every day | ORAL | Status: DC
  Filled 2020-11-25: qty 1

## 2020-11-25 MED ORDER — TICAGRELOR 90 MG PO TABS
90.0000 mg | ORAL_TABLET | Freq: Two times a day (BID) | ORAL | Status: DC
  Administered 2020-11-25 – 2020-12-12 (×34): 90 mg via ORAL
  Filled 2020-11-25 (×34): qty 1

## 2020-11-25 NOTE — Progress Notes (Signed)
Inpatient Rehabilitation Medication Review by a Pharmacist  A complete drug regimen review was completed for this patient to identify any potential clinically significant medication issues.  High Risk Drug Classes Is patient taking? Indication by Medication  Antipsychotic No   Anticoagulant No   Antibiotic No   Opioid No   Antiplatelet Yes Aspirin and brilinta - antiplatelet for stroke  Hypoglycemics/insulin No   Vasoactive Medication No   Chemotherapy No   Other No      Type of Medication Issue Identified Description of Issue Recommendation(s)  Drug Interaction(s) (clinically significant)  N/A   Duplicate Therapy  N/A   Allergy  N/A   No Medication Administration End Date  N/A   Incorrect Dose  N/A   Additional Drug Therapy Needed  Hydrochlorothiazide- home med and listed to continue from discharge summary F/U in AM  Significant med changes from prior encounter (inform family/care partners about these prior to discharge). N/A   Other  N/A     Clinically significant medication issues were identified that warrant physician communication and completion of prescribed/recommended actions by midnight of the next day:  Yes  Name of provider notified for urgent issues identified:   Provider Method of Notification:     Pharmacist comments:   Time spent performing this drug regimen review (minutes):  15    Tad Moore 11/25/2020 7:23 PM

## 2020-11-25 NOTE — Progress Notes (Signed)
Physical Therapy Treatment Patient Details Name: Maria Dyer MRN: [id number] DOB: 01/29/1931 Today's Date: 11/25/2020    History of Present Illness 87 yo female L MCA near occlusion with revascularization TICI 3.  PMH HTN hypothroidism, severe hearing impairment, Breast CAs/p mastectomy, thoracolumbar scoliosis.    PT Comments    Pt received in supine, alert and able to indicate her name on paper when given 2 discrete choices (oriented to self), and with good participation in transfer, exercise and gait training. Pt with slightly improved participation/understanding of cues for seated/supine exercises and able to progress gait to 55ft with +2 mod/maxA and RW. Pt continues to be limited due to verbally expressed fear of falls but participatory with encouragement. Pt continues to benefit from PT services to progress toward functional mobility goals.   Follow Up Recommendations  SNF (vs CIR pending progress and family support)     Equipment Recommendations  3in1 (PT)    Recommendations for Other Services Rehab consult     Precautions / Restrictions Precautions Precautions: Fall Precaution Comments: global aphasia, HoH Restrictions Weight Bearing Restrictions: No    Mobility  Bed Mobility Overal bed mobility: Needs Assistance Bed Mobility: Supine to Sit;Sit to Supine     Supine to sit: Mod assist;+2 for safety/equipment Sit to supine: Mod assist;+2 for physical assistance   General bed mobility comments: multimodal cues, pt able to assist minimally with moving LE toward EOB and needs modA for trunk lifting/support; posterior lean upon sitting upright    Transfers Overall transfer level: Needs assistance Equipment used: Rolling walker (2 wheeled);4-wheeled walker Transfers: Sit to/from Stand Sit to Stand: Mod assist;+2 physical assistance         General transfer comment: posterior lean, pt needs cues for anterior lean and hand placement but not correctly following  cues for technique  Ambulation/Gait Ambulation/Gait assistance: Mod assist;+2 physical assistance Gait Distance (Feet): 8 Feet (39ft, then 65ft with seated break) Assistive device: Rolling walker (2 wheeled);4-wheeled walker Gait Pattern/deviations: Step-to pattern;Ataxic;Shuffle;Narrow base of support;Leaning posteriorly;Decreased stride length Gait velocity: decreased Gait velocity interpretation: <1.8 ft/sec, indicate of risk for recurrent falls General Gait Details: pt verbally expressed fear of falls but able to take short, low steps with max cues and assist to maintain upright trunk (Posterior lean throughout)    Modified Rankin (Stroke Patients Only) Modified Rankin (Stroke Patients Only) Pre-Morbid Rankin Score: Slight disability Modified Rankin: Moderately severe disability     Balance Overall balance assessment: Needs assistance Sitting-balance support: Feet supported Sitting balance-Leahy Scale: Fair     Standing balance support: Bilateral upper extremity supported Standing balance-Leahy Scale: Poor Standing balance comment: reliant on UE support and external assist; posterior lean throughout          Cognition Arousal/Alertness: Awake/alert Behavior During Therapy: Anxious;WFL for tasks assessed/performed Overall Cognitive Status: Difficult to assess Area of Impairment: Following commands;Safety/judgement;Problem solving            Following Commands: Follows one step commands inconsistently;Follows one step commands with increased time Safety/Judgement: Decreased awareness of safety   Problem Solving: Slow processing;Difficulty sequencing;Requires verbal cues;Requires tactile cues General Comments: Pt with receptive and expressive communication deficits.  She is able to follow simple commands inconsistently but does better with multi modal cues and increased time. Communication is purposeful and pt able to read words therapist wrote on paper and pointed to her  name which was written on paper when given 2 discrete choices. Pt anxious to perform standing/gait tasks and states "I'm going to fall."  Exercises Other Exercises Other Exercises: seated BLE AAROM: hip flexion, LAQ x10 reps ea (visual and tactile cues needed to initiate and perform and pt needing back support of chair due to posterior lean) Other Exercises: supine BLE AROM: ankle pumps, SLR x5-10 reps ea Other Exercises: supine BUE AROM: wrist flex/ext, elbow flex/ext, shoulder press (AA) x10 reps ea needs visual and verbal cues to perform Other Exercises: STS x2 reps    General Comments General comments (skin integrity, edema, etc.): HR tachy to 153 bpm with exertion, 100 bpm seated, otherwise VSS on RA      Pertinent Vitals/Pain Pain Assessment: Faces Faces Pain Scale: No hurt Pain Intervention(s): Monitored during session;Repositioned           PT Goals (current goals can now be found in the care plan section) Acute Rehab PT Goals Patient Stated Goal: Unable to state PT Goal Formulation: With patient Time For Goal Achievement: 12/04/20 Progress towards PT goals: Progressing toward goals    Frequency    Min 3X/week      PT Plan Current plan remains appropriate    Co-evaluation PT/OT/SLP Co-Evaluation/Treatment: Yes Reason for Co-Treatment: Complexity of the patient's impairments (multi-system involvement);Necessary to address cognition/behavior during functional activity;To address functional/ADL transfers PT goals addressed during session: Mobility/safety with mobility;Balance;Proper use of DME;Strengthening/ROM        AM-PAC PT "6 Clicks" Mobility   Outcome Measure  Help needed turning from your back to your side while in a flat bed without using bedrails?: A Lot Help needed moving from lying on your back to sitting on the side of a flat bed without using bedrails?: A Lot Help needed moving to and from a bed to a chair (including a wheelchair)?: A  Lot Help needed standing up from a chair using your arms (e.g., wheelchair or bedside chair)?: A Lot Help needed to walk in hospital room?: A Lot Help needed climbing 3-5 steps with a railing? : Total 6 Click Score: 11    End of Session Equipment Utilized During Treatment: Gait belt Activity Tolerance: Patient tolerated treatment well Patient left: in bed;with call bell/phone within reach;with bed alarm set;with SCD's reapplied Nurse Communication: Mobility status PT Visit Diagnosis: Unsteadiness on feet (R26.81);Muscle weakness (generalized) (M62.81);Difficulty in walking, not elsewhere classified (R26.2)     Time: 1287-8676 PT Time Calculation (min) (ACUTE ONLY): 23 min  Charges:  $Therapeutic Exercise: 8-22 mins                     Leisha Trinkle P., PTA Acute Rehabilitation Services Pager: [phone] Office: [phone]    Angus Palms 11/25/2020, 2:53 PM

## 2020-11-25 NOTE — Progress Notes (Addendum)
Inpatient Rehab Admissions Coordinator:   Awaiting determination from [facility]. Spoke to [place] and provided an update.   Estill Dooms, PT, DPT Admissions Coordinator [phone] 11/25/20  12:00 PM

## 2020-11-25 NOTE — H&P (Signed)
Physical Medicine and Rehabilitation Admission H&P  CC: Left MCA stroke  HPI: Maria Dyer is an 87 year old right-handed female with history of hypertension, severely hearing impaired, breast cancer status postmastectomy 25 years ago, hypothyroidism, hypertension.  Per chart review patient lives alone.  Household ambulator with walker.  Uses a wheelchair for longer distances in the community.  Independent with ADLs.  She has a niece as well as a grand niece in the area.  Presented 11/19/2020 with aphasia.  Admission chemistries unremarkable except creatinine 1.13.  CT of the head/CT angiogram head and neck, CT cerebral perfusion scan showed no acute intracranial hemorrhage.  Small acute left MCA territory infarct involving inferior insula and adjacent temporal lobe.  Early bifurcation of the left MCA and distal M1 and proximal M2 occlusion and subsequent partial reconstitution.  High-grade stenosis or nonocclusive thrombus within anterior temporal branch approximate 5 mm beyond its origin just proximal to the occlusion.  Perfusion imaging demonstrated 5 mL of core infarct partially corresponding to noncontrast CT abnormality.  No occlusion or hemodynamically significant stenosis in the neck.  Patient did not receive tPA.  Underwent mechanical thrombectomy per interventional radiology with stenting spanning the left M1-M2 posterior division.  MRI of the head post stenting showed multifocal acute ischemic left MCA distribution infarct involving left parietal and temporal lobes.  Minimal associated petechial hemorrhage without frank intraparenchymal hematoma or mass-effect.  No other acute intracranial abnormality.  Echocardiogram with ejection fraction of 60 to 65% no wall motion abnormalities.  Neurology follow-up maintained on aspirin 81 mg daily as well as Brilinta 90 mg twice daily.  Lower extremity Dopplers negative for DVT.  Awaiting plan for possible need for loop recorder.  Tolerating a regular  diet.  W OC follow-up for MASD buttocks and groin with skin care as directed.  Therapy evaluations completed due to patient's aphasia decreased functional mobility was admitted for a comprehensive rehab program. Currently very sleepy.  Review of Systems  Constitutional:  Negative for chills and fever.  HENT:  Positive for hearing loss.   Eyes:  Negative for blurred vision and double vision.  Respiratory:  Negative for cough and shortness of breath.   Cardiovascular:  Negative for chest pain, palpitations and leg swelling.  Gastrointestinal:  Positive for constipation. Negative for heartburn, nausea and vomiting.  Genitourinary:  Negative for dysuria, flank pain and hematuria.  Musculoskeletal:  Positive for joint pain and myalgias.  Skin:  Negative for rash.  Neurological:  Positive for speech change.  All other systems reviewed and are negative. Past Medical History:  Diagnosis Date   Hypertension    Past Surgical History:  Procedure Laterality Date   IR CT HEAD LTD  11/19/2020   IR INTRA CRAN STENT  11/19/2020   IR PERCUTANEOUS ART THROMBECTOMY/INFUSION INTRACRANIAL INC DIAG ANGIO  11/19/2020   IR US GUIDE VASC ACCESS LEFT  11/19/2020   RADIOLOGY WITH ANESTHESIA N/A 11/19/2020   Procedure: IR WITH ANESTHESIA;  Surgeon: Radiologist, Medication, MD;  Location: [facility] OR;  Service: Radiology;  Laterality: N/A;   History reviewed. No pertinent family history. Social History:  reports that she has never smoked. She has never used smokeless tobacco. No history on file for alcohol use and drug use. Allergies:  Allergies  Allergen Reactions   Enalapril     Other reaction(s): diarrhea   Fosinopril     Other reaction(s): diarrhea   Lisinopril     Other reaction(s): diarrhea   Medications Prior to Admission  Medication Sig  Dispense Refill   hydrochlorothiazide (HYDRODIURIL) 25 MG tablet Take 25 mg by mouth daily.     levothyroxine (SYNTHROID) 50 MCG tablet Take 50 mcg by mouth daily.      losartan (COZAAR) 100 MG tablet Take 100 mg by mouth daily.     metoprolol succinate (TOPROL-XL) 50 MG 24 hr tablet Take 50 mg by mouth 2 (two) times daily.     [START ON 11/26/2020] aspirin 81 MG chewable tablet Chew 1 tablet (81 mg total) by mouth daily.     [START ON 11/26/2020] atorvastatin (LIPITOR) 40 MG tablet Take 1 tablet (40 mg total) by mouth daily.     feeding supplement (ENSURE ENLIVE / ENSURE PLUS) LIQD Take 237 mLs by mouth 2 (two) times daily between meals. 237 mL 12   ticagrelor (BRILINTA) 90 MG TABS tablet Take 1 tablet (90 mg total) by mouth 2 (two) times daily. 60 tablet     Drug Regimen Review Drug regimen was reviewed and remains appropriate with no significant issues identified  Home: Home Living Family/patient expects to be discharged to:: Private residence Living Arrangements: Alone Available Help at Discharge: Family, Available PRN/intermittently Type of Home: House Home Equipment: Dan Humphreys - 2 wheels, Wheelchair - manual Additional Comments: niece and nephew present work as Regulatory affairs officer attendants that are based out of [place]. they meet once a week for lunch  Lives With: Alone   Functional History: Prior Function Level of Independence: Needs assistance Gait / Transfers Assistance Needed: uses walker, mainly household ambulator. w/c for longer distances out in community ADL's / Homemaking Assistance Needed: independent ADL's, medications, finances. pt family assists with transportation   Functional Status:  Mobility: Bed Mobility Overal bed mobility: Needs Assistance Bed Mobility: Supine to Sit Supine to sit: Mod assist General bed mobility comments: in recliner on arrival Transfers Overall transfer level: Needs assistance Equipment used: 2 person hand held assist Transfers: Sit to/from Stand Sit to Stand: Mod assist, +2 physical assistance Stand pivot transfers: Mod assist, +2 physical assistance, +2 safety/equipment General transfer comment: patient not  willing to attempt steps this date.  Niece states she was weak prior to admission, and only walked short household distances. Ambulation/Gait Ambulation/Gait assistance: Mod assist, +2 physical assistance Gait Distance (Feet): 5 Feet (x3) Assistive device: 2 person hand held assist Gait Pattern/deviations: Step-to pattern, Ataxic, Shuffle, Trunk flexed, Narrow base of support General Gait Details: patient resistant to attempts to ambulate this session due to fear of falling Gait velocity: decreased   ADL: ADL Overall ADL's : Needs assistance/impaired Eating/Feeding: Minimal assistance, Sitting Eating/Feeding Details (indicate cue type and reason): eating an egg sandwich provided by family. Grooming: Brushing hair, Minimal assistance, Sitting, Oral care, Standing Grooming Details (indicate cue type and reason): setup and supervision for brushing teeth in sit.  Mod A for throughness to stand and comb hair.  Only able to stand for short bouts - supporting herself with L hand and leaning on countertop. Lower Body Bathing Details (indicate cue type and reason): pt noted to have skin break down in peri area that was prior to admission. question hygiene and detail for task prior. pt noted to have blood on pad in room. RN to consult wound care for area after visualizing with therapist helping sustain standing Toilet Transfer: Moderate assistance, +2 for physical assistance, +2 for safety/equipment, Stand-pivot, BSC, RW Toilet Transfer Details (indicate cue type and reason): assist for balance as pt with posterior lean.  She appears anxious and demonstrates short, shuffling gait Functional mobility during  ADLs: Moderate assistance, Total assistance, +2 for safety/equipment   Cognition: Cognition Overall Cognitive Status: Difficult to assess Arousal/Alertness: Awake/alert Orientation Level: Oriented to person Attention: Sustained Sustained Attention: Impaired Sustained Attention Impairment:  Functional basic Awareness: Impaired Awareness Impairment: Anticipatory impairment, Emergent impairment Problem Solving:  (will continue diagnostic treatment) Behaviors: Perseveration Safety/Judgment: Impaired Comments: Pulls of medical equipment, states she will be going home with her neice today. Cognition Arousal/Alertness: Awake/alert Behavior During Therapy: Anxious, WFL for tasks assessed/performed Overall Cognitive Status: Difficult to assess Area of Impairment: Following commands Following Commands: Follows one step commands inconsistently General Comments: Pt with receptive and expressive communication deficits.  She is able to follow simple commands inconsistently with max  multi modal cues. Communication is purposeful and will name objects spontaneously at times without cueing Difficult to assess due to: Impaired communication  Physical Exam: Blood pressure (!) 148/55, pulse 76, temperature 98.8 F (37.1 C), resp. rate 19, weight 65.6 kg, SpO2 99 %. Physical Exam Gen: no distress, normal appearing HEENT: oral mucosa pink and moist, very hard of hearing Cardio: Reg rate Chest: normal effort, normal rate of breathing Abd: soft, non-distended Ext: no edema Psych: pleasant, normal affect Skin: intact Neurological:     Comments: Patient is severely hard of hearing.  Patient is awake alert, eyes are open.  Globally aphasic however she did have some short intermittent meaningful  sentences. She did not follow commands. ModA gait within room  Results for orders placed or performed during the hospital encounter of 11/19/20 (from the past 48 hour(s))  Glucose, capillary     Status: Abnormal   Collection Time: 11/23/20  9:33 PM  Result Value Ref Range   Glucose-Capillary 58 (L) 70 - 99 mg/dL    Comment: Glucose reference range applies only to samples taken after fasting for at least 8 hours.   Comment 1 Notify RN    Comment 2 Document in Chart   Glucose, capillary     Status:  None   Collection Time: 11/23/20 10:20 PM  Result Value Ref Range   Glucose-Capillary 79 70 - 99 mg/dL    Comment: Glucose reference range applies only to samples taken after fasting for at least 8 hours.   Comment 1 Notify RN    Comment 2 Document in Chart   Glucose, capillary     Status: Abnormal   Collection Time: 11/24/20 12:52 AM  Result Value Ref Range   Glucose-Capillary 163 (H) 70 - 99 mg/dL    Comment: Glucose reference range applies only to samples taken after fasting for at least 8 hours.   Comment 1 Notify RN    Comment 2 Document in Chart   CBC     Status: Abnormal   Collection Time: 11/24/20  1:29 AM  Result Value Ref Range   WBC 12.2 (H) 4.0 - 10.5 K/uL   RBC 3.91 3.87 - 5.11 MIL/uL   Hemoglobin 12.3 12.0 - 15.0 g/dL   HCT 82.9 56.2 - 13.0 %   MCV 95.9 80.0 - 100.0 fL   MCH 31.5 26.0 - 34.0 pg   MCHC 32.8 30.0 - 36.0 g/dL   RDW 86.5 78.4 - 69.6 %   Platelets 322 150 - 400 K/uL   nRBC 0.0 0.0 - 0.2 %    Comment: Performed at [facility], [address], [place], [place] [address]  Basic metabolic panel     Status: Abnormal   Collection Time: 11/24/20  1:29 AM  Result Value Ref Range  Sodium 137 135 - 145 mmol/L   Potassium 4.3 3.5 - 5.1 mmol/L   Chloride 102 98 - 111 mmol/L   CO2 24 22 - 32 mmol/L   Glucose, Bld 152 (H) 70 - 99 mg/dL    Comment: Glucose reference range applies only to samples taken after fasting for at least 8 hours.   BUN 20 8 - 23 mg/dL   Creatinine, Ser 3.66 (H) 0.44 - 1.00 mg/dL   Calcium 9.5 8.9 - 44.0 mg/dL   GFR, Estimated 47 (L) >60 mL/min    Comment: (NOTE) Calculated using the CKD-EPI Creatinine Equation (2021)    Anion gap 11 5 - 15    Comment: Performed at [facility], [address] [place], [place] [address]  Glucose, capillary     Status: None   Collection Time: 11/24/20  6:46 AM  Result Value Ref Range   Glucose-Capillary 89 70 - 99 mg/dL    Comment: Glucose reference range applies only to samples taken  after fasting for at least 8 hours.  Glucose, capillary     Status: Abnormal   Collection Time: 11/24/20 12:31 PM  Result Value Ref Range   Glucose-Capillary 185 (H) 70 - 99 mg/dL    Comment: Glucose reference range applies only to samples taken after fasting for at least 8 hours.  Glucose, capillary     Status: Abnormal   Collection Time: 11/24/20  4:35 PM  Result Value Ref Range   Glucose-Capillary 103 (H) 70 - 99 mg/dL    Comment: Glucose reference range applies only to samples taken after fasting for at least 8 hours.  Glucose, capillary     Status: Abnormal   Collection Time: 11/24/20  9:28 PM  Result Value Ref Range   Glucose-Capillary 108 (H) 70 - 99 mg/dL    Comment: Glucose reference range applies only to samples taken after fasting for at least 8 hours.  CBC with Differential/Platelet     Status: Abnormal   Collection Time: 11/25/20  1:02 AM  Result Value Ref Range   WBC 8.5 4.0 - 10.5 K/uL   RBC 3.44 (L) 3.87 - 5.11 MIL/uL   Hemoglobin 11.0 (L) 12.0 - 15.0 g/dL   HCT 59.5 (L) 63.8 - 75.6 %   MCV 95.1 80.0 - 100.0 fL   MCH 32.0 26.0 - 34.0 pg   MCHC 33.6 30.0 - 36.0 g/dL   RDW 43.3 29.5 - 18.8 %   Platelets 275 150 - 400 K/uL   nRBC 0.0 0.0 - 0.2 %   Neutrophils Relative % 73 %   Neutro Abs 6.2 1.7 - 7.7 K/uL   Lymphocytes Relative 11 %   Lymphs Abs 1.0 0.7 - 4.0 K/uL   Monocytes Relative 9 %   Monocytes Absolute 0.8 0.1 - 1.0 K/uL   Eosinophils Relative 5 %   Eosinophils Absolute 0.4 0.0 - 0.5 K/uL   Basophils Relative 1 %   Basophils Absolute 0.1 0.0 - 0.1 K/uL   Immature Granulocytes 1 %   Abs Immature Granulocytes 0.04 0.00 - 0.07 K/uL    Comment: Performed at [facility], [address] [place], [place] [address]  Comprehensive metabolic panel     Status: Abnormal   Collection Time: 11/25/20  1:02 AM  Result Value Ref Range   Sodium 134 (L) 135 - 145 mmol/L   Potassium 4.0 3.5 - 5.1 mmol/L   Chloride 104 98 - 111 mmol/L   CO2 22 22 - 32  mmol/L    Glucose, Bld 107 (H) 70 - 99 mg/dL    Comment: Glucose reference range applies only to samples taken after fasting for at least 8 hours.   BUN 26 (H) 8 - 23 mg/dL   Creatinine, Ser 8.65 (H) 0.44 - 1.00 mg/dL   Calcium 8.8 (L) 8.9 - 10.3 mg/dL   Total Protein 5.8 (L) 6.5 - 8.1 g/dL   Albumin 2.8 (L) 3.5 - 5.0 g/dL   AST 19 15 - 41 U/L   ALT 16 0 - 44 U/L   Alkaline Phosphatase 46 38 - 126 U/L   Total Bilirubin 0.9 0.3 - 1.2 mg/dL   GFR, Estimated 43 (L) >60 mL/min    Comment: (NOTE) Calculated using the CKD-EPI Creatinine Equation (2021)    Anion gap 8 5 - 15    Comment: Performed at [facility], [address], [place], [place] [address]  Urinalysis, Routine w reflex microscopic Urine, Clean Catch     Status: Abnormal   Collection Time: 11/25/20  3:33 AM  Result Value Ref Range   Color, Urine YELLOW YELLOW   APPearance HAZY (A) CLEAR   Specific Gravity, Urine 1.014 1.005 - 1.030   pH 5.0 5.0 - 8.0   Glucose, UA NEGATIVE NEGATIVE mg/dL   Hgb urine dipstick MODERATE (A) NEGATIVE   Bilirubin Urine NEGATIVE NEGATIVE   Ketones, ur NEGATIVE NEGATIVE mg/dL   Protein, ur NEGATIVE NEGATIVE mg/dL   Nitrite NEGATIVE NEGATIVE   Leukocytes,Ua MODERATE (A) NEGATIVE   RBC / HPF 0-5 0 - 5 RBC/hpf   WBC, UA 6-10 0 - 5 WBC/hpf   Bacteria, UA RARE (A) NONE SEEN   Squamous Epithelial / LPF 0-5 0 - 5    Comment: Performed at [facility], [address], [place], [place] [address]  Glucose, capillary     Status: Abnormal   Collection Time: 11/25/20  6:41 AM  Result Value Ref Range   Glucose-Capillary 110 (H) 70 - 99 mg/dL    Comment: Glucose reference range applies only to samples taken after fasting for at least 8 hours.  Glucose, capillary     Status: Abnormal   Collection Time: 11/25/20 12:09 PM  Result Value Ref Range   Glucose-Capillary 133 (H) 70 - 99 mg/dL    Comment: Glucose reference range applies only to samples taken after fasting for at least 8 hours.  Glucose,  capillary     Status: Abnormal   Collection Time: 11/25/20  4:52 PM  Result Value Ref Range   Glucose-Capillary 102 (H) 70 - 99 mg/dL    Comment: Glucose reference range applies only to samples taken after fasting for at least 8 hours.   DG Chest 1 View  Result Date: 11/24/2020 CLINICAL DATA:  Aspiration into airway. EXAM: CHEST  1 VIEW COMPARISON:  None. FINDINGS: Scoliotic curvature of the thoracolumbar spine. No pneumothorax. Healed left rib fractures. The heart, hila, and mediastinum are unremarkable with no acute abnormalities. Calcified atherosclerosis is seen in the thoracic aorta. No pulmonary nodules or masses. No focal infiltrates. No acute abnormalities. IMPRESSION: No active disease. Electronically Signed   By: Gerome Sam III M.D.   On: 11/24/2020 13:36       Medical Problem List and Plan: 1.  Aphasia secondary to left MCA patchy infarct/left M2 near occlusion status post revascularization with stenting  -patient may shower  -ELOS/Goals: 10-14 days S  -Check Vitamin D level tomorrow 2.  Antithrombotics: -DVT/anticoagulation:  Mechanical: Sequential  compression devices, below knee Bilateral lower extremities  -antiplatelet therapy: Aspirin 81 mg daily and Brilinta 90 mg twice daily 3. Pain Management: Tylenol as needed 4. Mood: Provide emotional support  -antipsychotic agents: N/A 5. Neuropsych: This patient is not capable of making decisions on her own behalf. 6. Skin/Wound Care: Routine skin checks 7. Fluids/Electrolytes/Nutrition: Routine in and outs with follow-up chemistries 8.  Hyperlipidemia.  Lipitor 9.  Hypothyroidism.  Synthroid 10.  Hypertension.  Labile, continue Cozaar 100 mg daily, Toprol-XL 50 mg daily.  Monitor with increased mobility. Check magnesium level tomorrow.  11.  MASD buttocks and groin.  Zinc oxide as directed with routine skin checks 12. AKI: Repeat Cr tomorrow.   I have personally performed a face to face diagnostic evaluation, including,  but not limited to relevant history and physical exam findings, of this patient and developed relevant assessment and plan.  Additionally, I have reviewed and concur with the physician assistant's documentation above.  Sula SodaKrutika Chesky Heyer, MD   Mcarthur Rossettianiel J Angiulli, PA-C

## 2020-11-25 NOTE — PMR Pre-admission (Signed)
PMR Admission Coordinator Pre-Admission Assessment  Patient: Maria Dyer is an 87 y.o., female MRN: [id number] DOB: Feb 19, 1931 Height: _0  (175.3 cm) Weight: 63.8 kg  Insurance Information HMO:     PPO: yes     PCP:      IPA:      80/20:      OTHER:  PRIMARY: [employer] Medicare      Policy#: [id number]      Subscriber: pt CM Name: Lenna Sciara      Phone#: n/a     Fax#: [phone] Pre-Cert#: [id number] auth for CIR given by Lenna Sciara at [facility] with updates due to [place] at fax listed above on 9/6 (phone [phone])      Employer:  Benefits:  Phone #: [phone]     Name:  Eff. Date: 03/30/20     Deduct: $0      Out of Pocket Max: $5900 ($0 met)      Life Max: n/a CIR: $375/day for days 1-5      SNF: 20 full days Outpatient:      Co-Pay: $35/visit Home Health: 100%      Co-Pay:  DME: 80%     Co-Pay: 20% Providers:  SECONDARY:       Policy#:      Phone#:   Development worker, community:       Phone#:   The "Data Collection Information Summary" for patients in Inpatient Rehabilitation Facilities with attached "Privacy Act [place] Records" was provided and verbally reviewed with: Patient and Family  Emergency Contact Information Contact Information     Name Relation Home Work Mobile   Waggy,Danielle Niece   [place]   [phone]   Kenard Gower Niece [phone]  [phone]       Current Medical History  Patient Admitting Diagnosis: CVA   History of Present Illness: Maria Dyer is an 87 year old right-handed female with history of hypertension, severely hearing impaired, breast cancer status postmastectomy 25 years ago, hypothyroidism, hypertension.  Presented 11/19/2020 with aphasia.  Admission chemistries unremarkable except creatinine 1.13.  CT of the head/CT angiogram head and neck, CT cerebral perfusion scan showed no acute intracranial hemorrhage.  Small acute left MCA territory infarct involving inferior insula and adjacent temporal  lobe.  Early bifurcation of the left MCA and distal M1 and proximal M2 occlusion and subsequent partial reconstitution.  High-grade stenosis or nonocclusive thrombus within anterior temporal branch approximate 5 mm beyond its origin just proximal to the occlusion.  Perfusion imaging demonstrated 5 mL of core infarct partially corresponding to noncontrast CT abnormality.  No occlusion or hemodynamically significant stenosis in the neck.  Patient did not receive tPA.  Underwent mechanical thrombectomy per interventional radiology with stenting spanning the left M1-M2 posterior division.  MRI of the head post stenting showed multifocal acute ischemic left MCA distribution infarct involving left parietal and temporal lobes.  Minimal associated petechial hemorrhage without frank intraparenchymal hematoma or mass-effect.  No other acute intracranial abnormality.  Echocardiogram with ejection fraction of 60 to 65% no wall motion abnormalities.  Neurology follow-up maintained on aspirin 81 mg daily as well as Brilinta 90 mg twice daily.  Lower extremity Dopplers negative for DVT.  Awaiting plan for possible need for loop recorder.  Tolerating a regular diet.  W OC follow-up for MASD buttocks and groin with skin care as directed.  Therapy evaluations completed due to patient's aphasia decreased functional mobility was recommended for a comprehensive rehab program.  Complete NIHSS TOTAL: 11  Patient's medical  record from [facility] has been reviewed by the rehabilitation admission coordinator and physician.  Past Medical History  Past Medical History:  Diagnosis Date   Hypertension     Has the patient had major surgery during 100 days prior to admission? Yes  Family History   family history is not on file.  Current Medications  Current Facility-Administered Medications:    0.9 %  sodium chloride infusion, , Intravenous, Continuous, Melvenia Beam, MD, Last Rate: 50 mL/hr at 11/24/20 1812, New Bag at  11/24/20 1812   acetaminophen (TYLENOL) tablet 650 mg, 650 mg, Oral, Q4H PRN, 650 mg at 11/22/20 2249 **OR** acetaminophen (TYLENOL) 160 MG/5ML solution 650 mg, 650 mg, Per Tube, Q4H PRN **OR** acetaminophen (TYLENOL) suppository 650 mg, 650 mg, Rectal, Q4H PRN, Rikki Spearing, NP   aspirin chewable tablet 81 mg, 81 mg, Oral, Daily, 81 mg at 11/25/20 1051 **OR** aspirin chewable tablet 81 mg, 81 mg, Per Tube, Daily, de Sindy Messing, [place], MD   atorvastatin (LIPITOR) tablet 40 mg, 40 mg, Oral, Daily, Rosalin Hawking, MD, 40 mg at 11/25/20 1051   Chlorhexidine Gluconate Cloth 2 % PADS 6 each, 6 each, Topical, Daily, Kerney Elbe, MD, 6 each at 11/24/20 0943   feeding supplement (ENSURE ENLIVE / ENSURE PLUS) liquid 237 mL, 237 mL, Oral, BID BM, Rosalin Hawking, MD, 237 mL at 11/25/20 1104   labetalol (NORMODYNE) injection 5-20 mg, 5-20 mg, Intravenous, Q2H PRN, Rosalin Hawking, MD, 10 mg at 11/21/20 1523   levothyroxine (SYNTHROID) tablet 50 mcg, 50 mcg, Oral, Daily, Bhagat, Srishti L, MD, 50 mcg at 11/25/20 0612   losartan (COZAAR) tablet 100 mg, 100 mg, Oral, Daily, Leonie Man, Pramod S, MD, 100 mg at 11/25/20 1051   metoprolol succinate (TOPROL-XL) 24 hr tablet 50 mg, 50 mg, Oral, Daily, Leonie Man, Pramod S, MD, 50 mg at 11/25/20 1051   senna-docusate (Senokot-S) tablet 1 tablet, 1 tablet, Oral, QHS PRN, Rikki Spearing, NP   ticagrelor (BRILINTA) tablet 90 mg, 90 mg, Oral, BID, 90 mg at 11/25/20 1051 **OR** ticagrelor (BRILINTA) tablet 90 mg, 90 mg, Per Tube, BID, Bhagat, Srishti L, MD   Zinc Oxide (TRIPLE PASTE) 12.8 % ointment, , Topical, PRN, Rosalin Hawking, MD, Given at 11/22/20 1827  Patients Current Diet:  Diet Order             Diet - low sodium heart healthy           Diet regular Room service appropriate? Yes; Fluid consistency: Thin  Diet effective now                   Precautions / Restrictions Precautions Precautions: Fall Precaution Comments: global aphasia,  HoH Restrictions Weight Bearing Restrictions: No   Has the patient had 2 or more falls or a fall with injury in the past year? No  Prior Activity Level Household: limited household mobility at baseline, mod I for mobility and basic ADLs and light meal prep (microwave, sandwiches, cereal, etc); family provides transportation and meals  Prior Functional Level Self Care: Did the patient need help bathing, dressing, using the toilet or eating? Independent  Indoor Mobility: Did the patient need assistance with walking from room to room (with or without device)? Independent  Stairs: Did the patient need assistance with internal or external stairs (with or without device)? Dependent  Functional Cognition: Did the patient need help planning regular tasks such as shopping or remembering to take medications? Needed some help  Patient Information Are you  of Hispanic, Latino/a,or Spanish origin?: X. Patient unable to respond What is your race?: X. Patient unable to respond Do you need or want an interpreter to communicate with a doctor or health care staff?: 9. Unable to respond  Patient's Response To:  Health Literacy and Transportation Is the patient able to respond to health literacy and transportation needs?: No  Home Assistive Devices / Ryan Devices/Equipment: Environmental consultant (specify type) Home Equipment: Walker - 2 wheels, Wheelchair - manual  Prior Device Use: Indicate devices/aids used by the patient prior to current illness, exacerbation or injury? Manual wheelchair and Walker  Current Functional Level Cognition  Arousal/Alertness: Awake/alert Overall Cognitive Status: Difficult to assess Difficult to assess due to: Impaired communication Orientation Level: Oriented to person Following Commands: Follows one step commands inconsistently, Follows one step commands with increased time Safety/Judgement: Decreased awareness of safety General Comments: Pt with receptive  and expressive communication deficits.  She is able to follow simple commands inconsistently but does better with multi modal cues and increased time. Communication is purposeful and pt able to read words therapist wrote on paper and pointed to her name which was written on paper when given 2 discrete choices. Pt anxious to perform standing/gait tasks and states "I'm going to fall." Attention: Sustained Sustained Attention: Impaired Sustained Attention Impairment: Functional basic Awareness: Impaired Awareness Impairment: Anticipatory impairment, Emergent impairment Problem Solving:  (will continue diagnostic treatment) Behaviors: Perseveration Safety/Judgment: Impaired Comments: Pulls of medical equipment, states she will be going home with her neice today.    Extremity Assessment (includes Sensation/Coordination)  Upper Extremity Assessment: RUE deficits/detail RUE Deficits / Details: mild weakness Rt UE.  She was able to comb her hair wiht Rt UE.  Unable to accurately asses Rt UE due to communication deficits and difficulty following commands  Lower Extremity Assessment: Defer to PT evaluation    ADLs  Overall ADL's : Needs assistance/impaired Eating/Feeding: Minimal assistance, Sitting Eating/Feeding Details (indicate cue type and reason): eating an egg sandwich provided by family. Grooming: Brushing hair, Minimal assistance, Sitting, Oral care, Standing Grooming Details (indicate cue type and reason): setup and supervision for brushing teeth in sit.  Mod A for throughness to stand and comb hair.  Only able to stand for short bouts - supporting herself with L hand and leaning on countertop. Lower Body Bathing Details (indicate cue type and reason): pt noted to have skin break down in peri area that was prior to admission. question hygiene and detail for task prior. pt noted to have blood on pad in room. RN to consult wound care for area after visualizing with therapist helping sustain  standing Toilet Transfer: Moderate assistance, +2 for physical assistance, +2 for safety/equipment, Stand-pivot, BSC, RW Toilet Transfer Details (indicate cue type and reason): assist for balance as pt with posterior lean.  She appears anxious and demonstrates short, shuffling gait Functional mobility during ADLs: Moderate assistance, Total assistance, +2 for safety/equipment    Mobility  Overal bed mobility: Needs Assistance Bed Mobility: Supine to Sit, Sit to Supine Supine to sit: Mod assist, +2 for safety/equipment Sit to supine: Mod assist, +2 for physical assistance General bed mobility comments: multimodal cues, pt able to assist minimally with moving LE toward EOB and needs modA for trunk lifting/support; posterior lean upon sitting upright    Transfers  Overall transfer level: Needs assistance Equipment used: Rolling walker (2 wheeled), 4-wheeled walker Transfers: Sit to/from Stand Sit to Stand: Mod assist, +2 physical assistance Stand pivot transfers: Mod assist, +2 physical  assistance, +2 safety/equipment General transfer comment: posterior lean, pt needs cues for anterior lean and hand placement but not correctly following cues for technique    Ambulation / Gait / Stairs / Wheelchair Mobility  Ambulation/Gait Ambulation/Gait assistance: Mod assist, +2 physical assistance Gait Distance (Feet): 8 Feet (58f, then 832fwith seated break) Assistive device: Rolling walker (2 wheeled), 4-wheeled walker Gait Pattern/deviations: Step-to pattern, Ataxic, Shuffle, Narrow base of support, Leaning posteriorly, Decreased stride length General Gait Details: pt verbally expressed fear of falls but able to take short, low steps with max cues and assist to maintain upright trunk (Posterior lean throughout) Gait velocity: decreased Gait velocity interpretation: <1.8 ft/sec, indicate of risk for recurrent falls    Posture / Balance Dynamic Sitting Balance Sitting balance - Comments: requires min  guard to sit at edge of recliner for safety Balance Overall balance assessment: Needs assistance Sitting-balance support: Feet supported Sitting balance-Leahy Scale: Fair Sitting balance - Comments: requires min guard to sit at edge of recliner for safety Postural control: Posterior lean Standing balance support: Bilateral upper extremity supported Standing balance-Leahy Scale: Poor Standing balance comment: reliant on UE support and external assist; posterior lean throughout    Special needs/care consideration N/a   Previous Home Environment (from acute therapy documentation) Living Arrangements: Alone  Lives With: Alone Available Help at Discharge: Family, Available PRN/intermittently Type of Home: [place] Additional Comments: niece and nephew present work as flScience writerttendants [place]  Discharge Living Setting Plans for Discharge Living Setting: Patient's home, Alone Type of Home at Discharge: House Discharge Home Layout: One level Discharge Home Access: [place] Discharge Bathroom Shower/Tub: [place] Discharge Bathroom Toilet: Handicapped height Discharge Bathroom Accessibility: Yes How Accessible: Accessible via wheelchair Does the patient have any problems obtaining your medications?: No  Social/Family/Support Systems Anticipated Caregiver: DaMerrie Roofniece) Anticipated Caregiver's Contact Information: [phone]/Limitations of Caregiver: family works, but veScience writernderstanding recommendation for supervision at discharge Caregiver Availability: 24/7 Discharge Plan Discussed with Primary Caregiver: Yes Is Caregiver In Agreement with Plan?: Yes Does Caregiver/Family have Issues with Lodging/Transportation while Pt is in Rehab?: No  Goals Patient/Family Goal for Rehab: PT/OT supervision to min assist; SLP mod assist Expected length of stay: 21-24 days Additional  Information: pt is extremely hard of hearing at baseline Pt/Family Agrees to Admission and willing to participate: Yes Program Orientation Provided & Reviewed with Pt/Caregiver Including Roles  & Responsibilities: Yes  Barriers to Discharge: Insurance for SNF coverage, Decreased caregiver support  Decrease burden of Care through IP rehab admission: N/A  Possible need for SNF placement upon discharge: not anticipated. Reviewed with family that she would not be able to transition from CIR to SNF, and would most likely need 24/7 supervision.  Family discussed and agreed to pursue admission.   Patient Condition: [facility], and patient and family member. I met with patient at the bedside and discussed via phone for inpatient rehabilitation assessment.  Patient will benefit from ongoing PT, OT, and SLP, can actively participate in 3 hours of therapy a day 5 days of the week, and can make measurable gains during the admission.  Patient will also benefit from the coordinated team approach during an Inpatient Acute Rehabilitation admission.  The patient will receive intensive therapy as well as Rehabilitation physician, nursing, social worker, and care management interventions.  Due to bladder management, bowel management, safety,  skin/wound care, disease management, medication administration, pain management, patient education, the patient requires 24 hour a day rehabilitation nursing.  The patient is currently mod +2 with mobility and basic ADLs.  Discharge setting and therapy post discharge at home with home health is anticipated.  Patient has agreed to participate in the Acute Inpatient Rehabilitation Program and will admit today.  Preadmission Screen Completed By:  Michel Santee, PT, DPT 11/25/2020 3:00 PM ______________________________________________________________________   Discussed status with Dr. Ranell Patrick  on 11/25/20  at 3:00 PM  and received  approval for admission today.  Admission Coordinator:  Michel Santee, PT, DPT time 3:00 PM Sudie Grumbling 11/25/20    Assessment/Plan: Diagnosis: Left MCA stroke Does the need for close, 24 hr/day Medical supervision in concert with the patient's rehab needs make it unreasonable for this patient to be served in a less intensive setting? Yes Co-Morbidities requiring supervision/potential complications: HTN, hypothyroidism, aphasia, s/p breast cancer, severely hearing impaired Due to bladder management, bowel management, safety, skin/wound care, disease management, medication administration, pain management, and patient education, does the patient require 24 hr/day rehab nursing? Yes Does the patient require coordinated care of a physician, rehab nurse, PT, OT, and SLP to address physical and functional deficits in the context of the above medical diagnosis(es)? Yes Addressing deficits in the following areas: balance, endurance, locomotion, strength, transferring, bowel/bladder control, bathing, dressing, feeding, grooming, toileting, cognition, and psychosocial support Can the patient actively participate in an intensive therapy program of at least 3 hrs of therapy 5 days a week? Yes The potential for patient to make measurable gains while on inpatient rehab is excellent Anticipated functional outcomes upon discharge from inpatient rehab: supervision PT, supervision OT, supervision SLP Estimated rehab length of stay to reach the above functional goals is: 10-14 days Anticipated discharge destination: Home 10. Overall Rehab/Functional Prognosis: excellent   MD Signature: Leeroy Cha, MD

## 2020-11-25 NOTE — TOC Transition Note (Signed)
Transition of Care [facility]) - CM/SW Discharge Note   Patient Details  Name: OLUWATOBI RUPPE MRN: [id number] Date of Birth: 04-21-1930  [facility] CM/SW Contact:  Kermit Balo, RN Phone Number: 11/25/2020, 2:41 PM   Clinical Narrative:    Patient is discharging to CIR today. CM signing off.   Final next level of care: IP Rehab Facility Barriers to Discharge: No Barriers Identified   Patient Goals and CMS Choice   CMS Medicare.gov Compare Post Acute Care list provided to:: Patient Choice offered to / list presented to : Patient  Discharge Placement                       Discharge Plan and Services     Post Acute Care Choice: IP Rehab                               Social Determinants of Health (SDOH) Interventions     Readmission Risk Interventions No flowsheet data found.

## 2020-11-25 NOTE — Progress Notes (Signed)
Occupational Therapy Treatment Patient Details Name: Maria Dyer MRN: [id number] DOB: [birth year] Today's Date: 11/25/2020    History of present illness 87 yo female L MCA near occlusion with revascularization TICI 3.  PMH HTN hypothroidism, severe hearing impairment, Breast CAs/p mastectomy, thoracolumbar scoliosis.   OT comments  Pt demonstrates OOB to chair and back this session with attempts at self feeding in chair. Pt attempting to return to supine once EOB. Pt needs (A) to sustain eob and agreeable to transfer when presented with personal rollator. Pt with very minimal sips on the ensure/ ice cream offered. Pt showing that she likes it but declining. Pt declined a cookie as well. Pt resting supine at end of session. Recommendation CIR.    Follow Up Recommendations  CIR    Equipment Recommendations  3 in 1 bedside commode    Recommendations for Other Services Rehab consult    Precautions / Restrictions Precautions Precautions: Fall Precaution Comments: global aphasia, HoH Restrictions Weight Bearing Restrictions: No       Mobility Bed Mobility Overal bed mobility: Needs Assistance Bed Mobility: Supine to Sit;Sit to Supine     Supine to sit: Mod assist;+2 for safety/equipment Sit to supine: Mod assist;+2 for physical assistance   General bed mobility comments: multimodal cues, pt able to assist minimally with moving LE toward EOB and needs modA for trunk lifting/support; posterior lean upon sitting upright    Transfers Overall transfer level: Needs assistance Equipment used: Rolling walker (2 wheeled);4-wheeled walker Transfers: Sit to/from Stand Sit to Stand: Mod assist;+2 physical assistance         General transfer comment: posterior lean, pt needs cues for anterior lean and hand placement but not correctly following cues for technique    Balance Overall balance assessment: Needs assistance Sitting-balance support: Feet supported Sitting balance-Leahy  Scale: Fair     Standing balance support: Bilateral upper extremity supported Standing balance-Leahy Scale: Poor Standing balance comment: reliant on UE support and external assist; posterior lean throughout                           ADL either performed or assessed with clinical judgement   ADL Overall ADL's : Needs assistance/impaired Eating/Feeding: Set up;Sitting Eating/Feeding Details (indicate cue type and reason): drinking ensure/ ice cream mixture like a smoothie but then states "no i have eaten" pt with two bites of lunch. RN reports poor POintake all day                     Toilet Transfer: +2 for physical assistance;Maximal assistance;BSC;Ambulation Toilet Transfer Details (indicate cue type and reason): simulated OOB to chair Toileting- Clothing Manipulation and Hygiene: Total assistance       Functional mobility during ADLs: Maximal assistance;+2 for safety/equipment;+2 for physical assistance;Rolling walker General ADL Comments: pt with a 3 wheel rollator in room to use initially to get patient to initiate. Pt completed transfer with a very flexed posture and decreased gait velocity. pt provided rolling walker and positioned further away in the chair to see bed as a motivator. pt progressed greater distance the second time but requires increased help with RW. pt states 'oh i can't " showing some fear of falling and use of unfamiliar object     Vision       Perception     Praxis      Cognition Arousal/Alertness: Awake/alert Behavior During Therapy: Anxious Overall Cognitive Status: Impaired/Different from baseline Area of Impairment:  Following commands;Safety/judgement;Problem solving                       Following Commands: Follows one step commands inconsistently;Follows one step commands with increased time Safety/Judgement: Decreased awareness of safety;Decreased awareness of deficits   Problem Solving: Slow processing;Decreased  initiation General Comments: pt reading "what is your name? and then states "name" in response. Pt shown "Corrinne and "kim" and pt points to [facility] yes Corrie Dandy. pt was unable to complete this task in previous session so this shows improvement. pt demonstrates what the therapist does first by reaching and clapping and opening closing digits        Exercises Exercises: Other exercises Other Exercises Other Exercises: seated BLE AAROM: hip flexion, LAQ x10 reps ea (visual and tactile cues needed to initiate and perform and pt needing back support of chair due to posterior lean) Other Exercises: supine BLE AROM: ankle pumps, SLR x5-10 reps ea Other Exercises: supine BUE AROM: wrist flex/ext, elbow flex/ext, shoulder press (AA) x10 reps ea needs visual and verbal cues to perform Other Exercises: STS x2 reps   Shoulder Instructions       General Comments max HR 153 noted DOE 3 out 4. pt requires increased time to control breath    Pertinent Vitals/ Pain       Pain Assessment: No/denies pain Faces Pain Scale: No hurt Pain Intervention(s): Monitored during session  Home Living                                          Prior Functioning/Environment              Frequency  Min 2X/week        Progress Toward Goals  OT Goals(current goals can now be found in the care plan section)  Progress towards OT goals: Progressing toward goals  Acute Rehab OT Goals Patient Stated Goal: Unable to state OT Goal Formulation: Patient unable to participate in goal setting Potential to Achieve Goals: Fair ADL Goals Pt Will Perform Grooming: with supervision;sitting Pt Will Perform Upper Body Bathing: with min guard assist;sitting Pt Will Transfer to Toilet: with mod assist;bedside commode;stand pivot transfer Additional ADL Goal #1: pt will complete bed mobility min (A) as precursor to adls.  Plan Discharge plan remains appropriate    Co-evaluation    PT/OT/SLP  Co-Evaluation/Treatment: Yes Reason for Co-Treatment: Complexity of the patient's impairments (multi-system involvement);Necessary to address cognition/behavior during functional activity;For patient/therapist safety;To address functional/ADL transfers PT goals addressed during session: Mobility/safety with mobility;Balance;Proper use of DME;Strengthening/ROM OT goals addressed during session: ADL's and self-care;Proper use of Adaptive equipment and DME;Strengthening/ROM      AM-PAC OT "6 Clicks" Daily Activity     Outcome Measure   Help from another person eating meals?: A Lot Help from another person taking care of personal grooming?: A Lot Help from another person toileting, which includes using toliet, bedpan, or urinal?: A Lot Help from another person bathing (including washing, rinsing, drying)?: A Lot Help from another person to put on and taking off regular upper body clothing?: A Lot Help from another person to put on and taking off regular lower body clothing?: A Lot 6 Click Score: 12    End of Session Equipment Utilized During Treatment: Gait belt;Rolling walker  OT Visit Diagnosis: Muscle weakness (generalized) (M62.81)   Activity Tolerance Patient tolerated treatment  well   Patient Left in bed;with call bell/phone within reach;with bed alarm set   Nurse Communication Mobility status;Precautions        Time: 7517-0017 OT Time Calculation (min): 21 min  Charges: OT General Charges $OT Visit: 1 Visit OT Treatments $Self Care/Home Management : 8-22 mins   Brynn, OTR/L  Acute Rehabilitation Services Pager: [phone] Office: [phone] .    Mateo Flow 11/25/2020, 3:22 PM

## 2020-11-25 NOTE — Progress Notes (Signed)
PMR Admission Coordinator Pre-Admission Assessment   Patient: Maria Dyer is an 87 y.o., female MRN: [id number] DOB: 14-Jul-1930 Height: _0  (175.3 cm) Weight: 63.8 kg   Insurance Information HMO:     PPO: yes     PCP:      IPA:      80/20:      OTHER:  PRIMARY: [employer]      Policy#: [id number]      Subscriber: pt CM Name: Maria Dyer      Phone#: n/a     Fax#: [phone] Pre-Cert#: [id number] auth for CIR given by Maria Dyer at [facility] with updates due to [place] at fax listed above on 9/6 (phone [phone])      Employer:  Benefits:  Phone #: [phone]     Name:  Eff. Date: 03/30/20     Deduct: $0      Out of Pocket Max: $5900 ($0 met)      Life Max: n/a CIR: $375/day for days 1-5      SNF: 20 full days Outpatient:      Co-Pay: $35/visit Home Health: 100%      Co-Pay:  DME: 80%     Co-Pay: 20% Providers:  SECONDARY:       Policy#:      Phone#:    Development worker, community:       Phone#:    The "Data Collection Information Summary" for patients in Inpatient Rehabilitation Facilities with attached "Privacy Act [place] Records" was provided and verbally reviewed with: Patient and Family   Emergency Contact Information Contact Information       Name Relation Home Work Mobile    Waggy,Danielle Niece     [place]     [phone]    Kenard Gower Niece [phone]   [phone]           Current Medical History  Patient Admitting Diagnosis: CVA    History of Present Illness: Maria Dyer is an 87 year old right-handed female with history of hypertension, severely hearing impaired, breast cancer status postmastectomy 25 years ago, hypothyroidism, hypertension.  Presented 11/19/2020 with aphasia.  Admission chemistries unremarkable except creatinine 1.13.  CT of the head/CT angiogram head and neck, CT cerebral perfusion scan showed no acute intracranial hemorrhage.  Small acute left MCA territory infarct involving inferior insula  and adjacent temporal lobe.  Early bifurcation of the left MCA and distal M1 and proximal M2 occlusion and subsequent partial reconstitution.  High-grade stenosis or nonocclusive thrombus within anterior temporal branch approximate 5 mm beyond its origin just proximal to the occlusion.  Perfusion imaging demonstrated 5 mL of core infarct partially corresponding to noncontrast CT abnormality.  No occlusion or hemodynamically significant stenosis in the neck.  Patient did not receive tPA.  Underwent mechanical thrombectomy per interventional radiology with stenting spanning the left M1-M2 posterior division.  MRI of the head post stenting showed multifocal acute ischemic left MCA distribution infarct involving left parietal and temporal lobes.  Minimal associated petechial hemorrhage without frank intraparenchymal hematoma or mass-effect.  No other acute intracranial abnormality.  Echocardiogram with ejection fraction of 60 to 65% no wall motion abnormalities.  Neurology follow-up maintained on aspirin 81 mg daily as well as Brilinta 90 mg twice daily.  Lower extremity Dopplers negative for DVT.  Awaiting plan for possible need for loop recorder.  Tolerating a regular diet.  W OC follow-up for MASD buttocks and groin with skin care as directed.  Therapy evaluations completed due to  patient's aphasia decreased functional mobility was recommended for a comprehensive rehab program.   Complete NIHSS TOTAL: 11   Patient's medical record from [facility] has been reviewed by the rehabilitation admission coordinator and physician.   Past Medical History      Past Medical History:  Diagnosis Date   Hypertension        Has the patient had major surgery during 100 days prior to admission? Yes   Family History   family history is not on file.   Current Medications   Current Facility-Administered Medications:    0.9 %  sodium chloride infusion, , Intravenous, Continuous, Melvenia Beam, MD, Last Rate: 50  mL/hr at 11/24/20 1812, New Bag at 11/24/20 1812   acetaminophen (TYLENOL) tablet 650 mg, 650 mg, Oral, Q4H PRN, 650 mg at 11/22/20 2249 **OR** acetaminophen (TYLENOL) 160 MG/5ML solution 650 mg, 650 mg, Per Tube, Q4H PRN **OR** acetaminophen (TYLENOL) suppository 650 mg, 650 mg, Rectal, Q4H PRN, Rikki Spearing, NP   aspirin chewable tablet 81 mg, 81 mg, Oral, Daily, 81 mg at 11/25/20 1051 **OR** aspirin chewable tablet 81 mg, 81 mg, Per Tube, Daily, de Sindy Messing, Mora, MD   atorvastatin (LIPITOR) tablet 40 mg, 40 mg, Oral, Daily, Rosalin Hawking, MD, 40 mg at 11/25/20 1051   Chlorhexidine Gluconate Cloth 2 % PADS 6 each, 6 each, Topical, Daily, Kerney Elbe, MD, 6 each at 11/24/20 0943   feeding supplement (ENSURE ENLIVE / ENSURE PLUS) liquid 237 mL, 237 mL, Oral, BID BM, Rosalin Hawking, MD, 237 mL at 11/25/20 1104   labetalol (NORMODYNE) injection 5-20 mg, 5-20 mg, Intravenous, Q2H PRN, Rosalin Hawking, MD, 10 mg at 11/21/20 1523   levothyroxine (SYNTHROID) tablet 50 mcg, 50 mcg, Oral, Daily, Bhagat, Srishti L, MD, 50 mcg at 11/25/20 0612   losartan (COZAAR) tablet 100 mg, 100 mg, Oral, Daily, Leonie Man, Pramod S, MD, 100 mg at 11/25/20 1051   metoprolol succinate (TOPROL-XL) 24 hr tablet 50 mg, 50 mg, Oral, Daily, Leonie Man, Pramod S, MD, 50 mg at 11/25/20 1051   senna-docusate (Senokot-S) tablet 1 tablet, 1 tablet, Oral, QHS PRN, Rikki Spearing, NP   ticagrelor (BRILINTA) tablet 90 mg, 90 mg, Oral, BID, 90 mg at 11/25/20 1051 **OR** ticagrelor (BRILINTA) tablet 90 mg, 90 mg, Per Tube, BID, Bhagat, Srishti L, MD   Zinc Oxide (TRIPLE PASTE) 12.8 % ointment, , Topical, PRN, Rosalin Hawking, MD, Given at 11/22/20 1827   Patients Current Diet:  Diet Order                  Diet - low sodium heart healthy             Diet regular Room service appropriate? Yes; Fluid consistency: Thin  Diet effective now                         Precautions / Restrictions Precautions Precautions:  Fall Precaution Comments: global aphasia, HoH Restrictions Weight Bearing Restrictions: No    Has the patient had 2 or more falls or a fall with injury in the past year? No   Prior Activity Level Household: limited household mobility at baseline, mod I for mobility and basic ADLs and light meal prep (microwave, sandwiches, cereal, etc); family provides transportation and meals   Prior Functional Level Self Care: Did the patient need help bathing, dressing, using the toilet or eating? Independent   Indoor Mobility: Did the patient need assistance with walking from room  to room (with or without device)? Independent   Stairs: Did the patient need assistance with internal or external stairs (with or without device)? Dependent   Functional Cognition: Did the patient need help planning regular tasks such as shopping or remembering to take medications? Needed some help   Patient Information Are you of Hispanic, Latino/a,or Spanish origin?: X. Patient unable to respond What is your race?: X. Patient unable to respond Do you need or want an interpreter to communicate with a doctor or health care staff?: 9. Unable to respond   Patient's Response To:  Health Literacy and Transportation Is the patient able to respond to health literacy and transportation needs?: No   Home Assistive Devices / [place] Devices/Equipment: Environmental consultant (specify type) Home Equipment: Walker - 2 wheels, Wheelchair - manual   Prior Device Use: Indicate devices/aids used by the patient prior to current illness, exacerbation or injury? Manual wheelchair and Walker   Current Functional Level Cognition   Arousal/Alertness: Awake/alert Overall Cognitive Status: Difficult to assess Difficult to assess due to: Impaired communication Orientation Level: Oriented to person Following Commands: Follows one step commands inconsistently, Follows one step commands with increased time Safety/Judgement: Decreased  awareness of safety General Comments: Pt with receptive and expressive communication deficits.  She is able to follow simple commands inconsistently but does better with multi modal cues and increased time. Communication is purposeful and pt able to read words therapist wrote on paper and pointed to her name which was written on paper when given 2 discrete choices. Pt anxious to perform standing/gait tasks and states "I'm going to fall." Attention: Sustained Sustained Attention: Impaired Sustained Attention Impairment: Functional basic Awareness: Impaired Awareness Impairment: Anticipatory impairment, Emergent impairment Problem Solving:  (will continue diagnostic treatment) Behaviors: Perseveration Safety/Judgment: Impaired Comments: Pulls of medical equipment, states she will be going home with her neice today.    Extremity Assessment (includes Sensation/Coordination)   Upper Extremity Assessment: RUE deficits/detail RUE Deficits / Details: mild weakness Rt UE.  She was able to comb her hair wiht Rt UE.  Unable to accurately asses Rt UE due to communication deficits and difficulty following commands  Lower Extremity Assessment: Defer to PT evaluation     ADLs   Overall ADL's : Needs assistance/impaired Eating/Feeding: Minimal assistance, Sitting Eating/Feeding Details (indicate cue type and reason): eating an egg sandwich provided by family. Grooming: Brushing hair, Minimal assistance, Sitting, Oral care, Standing Grooming Details (indicate cue type and reason): setup and supervision for brushing teeth in sit.  Mod A for throughness to stand and comb hair.  Only able to stand for short bouts - supporting herself with L hand and leaning on countertop. Lower Body Bathing Details (indicate cue type and reason): pt noted to have skin break down in peri area that was prior to admission. question hygiene and detail for task prior. pt noted to have blood on pad in room. RN to consult wound care  for area after visualizing with therapist helping sustain standing Toilet Transfer: Moderate assistance, +2 for physical assistance, +2 for safety/equipment, Stand-pivot, BSC, RW Toilet Transfer Details (indicate cue type and reason): assist for balance as pt with posterior lean.  She appears anxious and demonstrates short, shuffling gait Functional mobility during ADLs: Moderate assistance, Total assistance, +2 for safety/equipment     Mobility   Overal bed mobility: Needs Assistance Bed Mobility: Supine to Sit, Sit to Supine Supine to sit: Mod assist, +2 for safety/equipment Sit to supine: Mod assist, +2 for physical assistance  General bed mobility comments: multimodal cues, pt able to assist minimally with moving LE toward EOB and needs modA for trunk lifting/support; posterior lean upon sitting upright     Transfers   Overall transfer level: Needs assistance Equipment used: Rolling walker (2 wheeled), 4-wheeled walker Transfers: Sit to/from Stand Sit to Stand: Mod assist, +2 physical assistance Stand pivot transfers: Mod assist, +2 physical assistance, +2 safety/equipment General transfer comment: posterior lean, pt needs cues for anterior lean and hand placement but not correctly following cues for technique     Ambulation / Gait / Stairs / Wheelchair Mobility   Ambulation/Gait Ambulation/Gait assistance: Mod assist, +2 physical assistance Gait Distance (Feet): 8 Feet (62f, then 861fwith seated break) Assistive device: Rolling walker (2 wheeled), 4-wheeled walker Gait Pattern/deviations: Step-to pattern, Ataxic, Shuffle, Narrow base of support, Leaning posteriorly, Decreased stride length General Gait Details: pt verbally expressed fear of falls but able to take short, low steps with max cues and assist to maintain upright trunk (Posterior lean throughout) Gait velocity: decreased Gait velocity interpretation: <1.8 ft/sec, indicate of risk for recurrent falls     Posture / Balance  Dynamic Sitting Balance Sitting balance - Comments: requires min guard to sit at edge of recliner for safety Balance Overall balance assessment: Needs assistance Sitting-balance support: Feet supported Sitting balance-Leahy Scale: Fair Sitting balance - Comments: requires min guard to sit at edge of recliner for safety Postural control: Posterior lean Standing balance support: Bilateral upper extremity supported Standing balance-Leahy Scale: Poor Standing balance comment: reliant on UE support and external assist; posterior lean throughout     Special needs/care consideration N/a    Previous Home Environment (from acute therapy documentation) Living Arrangements: Alone  Lives With: Alone Available Help at Discharge: Family, Available PRN/intermittently Type of Home: [place] Additional Comments: niece and nephew present work as fl[employer] writerttendants that [place] once a week for lunch   Discharge Living Setting Plans for Discharge Living Setting: Patient's home, Alone Type of Home at Discharge: House Discharge Home Layout: One level Discharge Home Access: RaLiscombntrance Discharge Bathroom Shower/Tub: TuT[place]omanit Discharge Bathroom Toilet: Handicapped height Discharge Bathroom Accessibility: Yes How Accessible: Accessible via wheelchair Does the patient have any problems obtaining your medications?: No   Social/Family/Support Systems Anticipated Caregiver: DaMerrie Roofniece) Anticipated Caregiver's Contact Information: [phone]/Limitations of Caregiver: family works, but veScience writernderstanding recommendation for supervision at discharge Caregiver Availability: 24/7 Discharge Plan Discussed with Primary Caregiver: Yes Is Caregiver In Agreement with Plan?: Yes Does Caregiver/Family have Issues with Lodging/Transportation while Pt is in Rehab?: No   Goals Patient/Family Goal for Rehab: PT/OT supervision to min  assist; SLP mod assist Expected length of stay: 21-24 days Additional Information: pt is extremely hard of hearing at baseline Pt/Family Agrees to Admission and willing to participate: Yes Program Orientation Provided & Reviewed with Pt/Caregiver Including Roles  & Responsibilities: Yes  Barriers to Discharge: Insurance for SNF coverage, Decreased caregiver support   Decrease burden of Care through IP rehab admission: N/A   Possible need for SNF placement upon discharge: not anticipated. Reviewed with family that she would not be able to transition from CIR to SNF, and would most likely need 24/7 supervision.  Family discussed and agreed to pursue admission.    [facility]: [facility], and patient and family member. I met with patient at the bedside and discussed via phone for inpatient rehabilitation  assessment.  Patient will benefit from ongoing PT, OT, and SLP, can actively participate in 3 hours of therapy a day 5 days of the week, and can make measurable gains during the admission.  Patient will also benefit from the coordinated team approach during an Inpatient Acute Rehabilitation admission.  The patient will receive intensive therapy as well as Rehabilitation physician, nursing, social worker, and care management interventions.  Due to bladder management, bowel management, safety, skin/wound care, disease management, medication administration, pain management, patient education, the patient requires 24 hour a day rehabilitation nursing.  The patient is currently mod +2 with mobility and basic ADLs.  Discharge setting and therapy post discharge at home with home health is anticipated.  Patient has agreed to participate in the Acute Inpatient Rehabilitation Program and will admit today.   Preadmission Screen Completed By:  Michel Santee, PT, DPT 11/25/2020 3:00 PM ______________________________________________________________________    Discussed status with Dr. Ranell Patrick  on 11/25/20  at 3:00 PM  and received approval for admission today.   Admission Coordinator:  Michel Santee, PT, DPT time 3:00 PM Sudie Grumbling 11/25/20     Assessment/Plan: Diagnosis: Left MCA stroke Does the need for close, 24 hr/day Medical supervision in concert with the patient's rehab needs make it unreasonable for this patient to be served in a less intensive setting? Yes Co-Morbidities requiring supervision/potential complications: HTN, hypothyroidism, aphasia, s/p breast cancer, severely hearing impaired Due to bladder management, bowel management, safety, skin/wound care, disease management, medication administration, pain management, and patient education, does the patient require 24 hr/day rehab nursing? Yes Does the patient require coordinated care of a physician, rehab nurse, PT, OT, and SLP to address physical and functional deficits in the context of the above medical diagnosis(es)? Yes Addressing deficits in the following areas: balance, endurance, locomotion, strength, transferring, bowel/bladder control, bathing, dressing, feeding, grooming, toileting, cognition, and psychosocial support Can the patient actively participate in an intensive therapy program of at least 3 hrs of therapy 5 days a week? Yes The potential for patient to make measurable gains while on inpatient rehab is excellent Anticipated functional outcomes upon discharge from inpatient rehab: supervision PT, supervision OT, supervision SLP Estimated rehab length of stay to reach the above functional goals is: 10-14 days Anticipated discharge destination: Home 10. Overall Rehab/Functional Prognosis: excellent     MD Signature:

## 2020-11-25 NOTE — Discharge Summary (Addendum)
Stroke Discharge Summary  Patient ID: Maria Dyer       MRN: [id number]      DOB: Aug 02, 1930  Date of Admission: 11/19/2020 Date of Discharge: 11/25/2020  Attending Physician:  Dr. Pearlean BrownieSethi  Consultant(s):  Neuro IR  Patient's PCP:  Maria Dyer  DISCHARGE DIAGNOSIS:  Active Problems:   Acute ischemic left MCA stroke [facility](HCC); due to left distal M1 and proximal M2 occlusion treated with successful mechanical thrombectomy  TICI2c revascularization-> Persistent severe stenosis -> stenting -> TICI3 Rescue left MCA angioplasty/stenting Resultant fluent aphasia   Pressure injury of skin   Allergies as of 11/25/2020   No Known Allergies      Medication List     TAKE these medications    aspirin 81 MG chewable tablet Chew 1 tablet (81 mg total) by mouth daily. Start taking on: November 26, 2020   atorvastatin 40 MG tablet Commonly known as: LIPITOR Take 1 tablet (40 mg total) by mouth daily. Start taking on: November 26, 2020   feeding supplement Liqd Take 237 mLs by mouth 2 (two) times daily between meals.   hydrochlorothiazide 25 MG tablet Commonly known as: HYDRODIURIL Take 25 mg by mouth daily.   levothyroxine 50 MCG tablet Commonly known as: SYNTHROID Take 50 mcg by mouth daily.   losartan 100 MG tablet Commonly known as: COZAAR Take 100 mg by mouth daily.   metoprolol succinate 50 MG 24 hr tablet Commonly known as: TOPROL-XL Take 50 mg by mouth 2 (two) times daily.   ticagrelor 90 MG Tabs tablet Commonly known as: BRILINTA Take 1 tablet (90 mg total) by mouth 2 (two) times daily.               Discharge Care Instructions  (From admission, onward)           Start     Ordered   11/25/20 0000  Discharge wound care:       Comments: Clean the buttocks and groin area with soap and water, rinse and pat dry. Apply triple paste to the area twice daily or PRN with soiling.   11/25/20 1409           SIGNIFICANT DIAGNOSTIC STUDIES 11/19/20 CT  Head WO Contrast  No acute intracranial hemorrhage. Small acute left MCA territory infarction involving inferior insula and adjacent temporal lobe (ASPECT score is 8).   11/19/20 CTA Head and Neck W WO Contrast  Early bifurcation of the left MCA with distal M1 and proximal M2 occlusion and subsequent partial reconstitution. High-grade stenosis or nonocclusive thrombus within anterior temporal branch approximately 5 mm beyond its origin just proximal to the occlusion.   Perfusion imaging demonstrates 5 mL of core infarction partially corresponding to noncontrast CT abnormality. There is a calculated penumbra of 39 mL in the left MCA territory.   No occlusion or hemodynamically significant stenosis in the neck.   11/20/20 MRI Brain WO Contrast  1. Multifocal acute ischemic left MCA distribution infarcts involving the left parietal and temporal lobes. Minimal associated petechial hemorrhage without frank intraparenchymal hematoma or mass effect. 2. No other acute intracranial abnormality. 3. Underlying age-related cerebral atrophy with mild chronic small vessel ischemic disease, with remote lacunar infarcts involving the right lentiform nucleus/internal capsule, and bilateral cerebellar hemispheres.   11/20/20 MR Angio Head WO Contrast  1. Technically limited exam due to motion artifact. 2. Interval placement of a vascular stent spanning the left M1-M2 segments. Flow within the stent itself is not assessed by MRA,  obscured by susceptibility artifact. Markedly attenuated and reduced flow seen beyond the stent within the left MCA branches distally, raising the possibility for intra stent stenosis or other pathology. Finding could be further assessed with dedicated CTA or repeat arteriogram as warranted. 3. Otherwise stable intracranial MRA, with no other new or progressive finding. 4. Fetal type origin of the PCAs with overall diminutive vertebrobasilar system.    HISTORY OF PRESENT ILLNESS  Maria Dyer is a 87 y.o. female with a medical history significant for essential hypertension, severe hearing impairment, breast cancer s/p mastectomy over 25 years ago per great niece, thoracolumbar scoliosis, and hypothyroidism who presented to the ED via EMS from home for evaluation of garbled and dysarthric speech. History is provided by patient's great niece due to patient with significant aphasia. Her great niece went to her house this morning as she does every Tuesday to take Maria Dyer for lunch. When she arrived at Maria Dyer house at noon, Maria Dyer had not gotten out of the bed or gotten dressed which is extremely unusual for her. When she attempted to speak, she was garbled and slurred with very little coherent speech but her great niece states she kept reiterating that she was reading and "all of a sudden". Her great niece also noted that her bed was wet and believes that she was incontinent of urine which is also abnormal. She was last seen normal by family on Sunday at baseline but Maria Dyer daughter reported that she spoke with Maria Dyer via Facebook messaging yesterday at 2:18PM without noted abnormality and her great niece viewed Maria Dyer sitting in her recliner through home video monitoring yesterday as well.    At baseline, Maria Dyer lives independently and is able to ambulate with a walker with reported "very limited mobility" but she is able to navigate around her home without assistance. She manages her own medication and bills and has family nearby who visit often and help with transportation to restaurants and hair appointments. When she is outside of the house, she sometimes prefers to use a wheelchair. She is extremely hard of hearing and family notes that she may say "yes" or nod inappropriately because she is unable to hear. Her great niece states that Maria Dyer does not need help with ADLs but indicates that she primarily cares for Maria Dyer out of all of  her family.    LKW: Last seen by family on Sunday, 11/17/2020. Last spoke to family without noted abnormality via messaging 11/18/2020 at 14:18 tpa given?: no, outside of thrombolytic therapy window IR Thrombectomy? Yes, hyperdense vessel sign at L MCA distal M1, proximal M2 on CTH with small acute LEFT MCA territory infarction, core volume of 90mL with mismatch of 39 mL on CTP.  Modified Rankin Scale: 1-No significant post stroke disability and can perform usual duties with stroke symptoms   DISCHARGE EXAM General - Well nourished, well developed, in no apparent distress.   Ophthalmologic - normal sclera   Cardiovascular - Regular rhythm and rate.   Mental Status -  Level of arousal- she is awake and alert, with Fluent aphasia. Able to say periodically say short sentences/words out however is nonsensical and not appropriate for conversation. Not able to follow commands  Language globally aphasia. No dysarthria noted    Cranial Nerves II - XII - II - Visual field intact OU. Blinks to threat III, IV, VI - Extraocular movements intact, can track  V - Facial sensation intact bilaterally. VII -  Facial movement intact bilaterally. VIII - Hearing & vestibular intact bilaterally. X - Palate elevates symmetrically. XII - not able to assess   Motor Strength - Able to move all 4 extremities spontaneously, bilateral uppers and lowers 4/5. Bulk was normal and fasciculations were absent.   Motor Tone - Muscle tone was assessed at the neck and appendages and was normal.   Sensory - responds to noxious stimuli   Coordination - Unable to assess .   Gait and Station - deferred.    DISCHARGE PLAN Maria Dyer is a 88 y.o. female with history of essential hypertension, severe hearing impairment, breast cancer s/p mastectomy over 25 years ago per great niece, thoracolumbar scoliosis, and hypothyroidism  who presented to the ED via EMS from home for evaluation of garbled and dysarthric speech.   She did not receive IV t-PA due to late presentation (>4.5 hours from time of onset) but underwent thrombectomy with TICI2C.    Stroke: L MCA patchy infarcts - embolic - source cryptogenic s/p thrombectomy L M2 near occlusion -> TICI2c -> Persistent severe stenosis -> stenting -> TICI3 CT head - No acute intracranial hemorrhage. Small acute left MCA territory infarction involving inferior insula and adjacent temporal lobe (ASPECT score is 8). CTA H&N - Early bifurcation of the left MCA with distal M1 and proximal M2 occlusion and subsequent partial reconstitution. High-grade stenosis or nonocclusive thrombus within anterior temporal branch approximately 5 mm beyond its origin just proximal to the occlusion.  CTP - 5/44, positive penumbra. IR - left M2 near occlusion -> TICI2c -> Persistent severe stenosis -> stenting -> TICI3 MRI head - Multifocal acute ischemic left MCA distribution infarcts involving the left parietal and temporal lobes.  MRA head - Interval placement of a vascular stent spanning the left M1-M2 segments. Markedly attenuated and reduced flow seen beyond the stent within the left MCA branches distally, raising the possibility for intra stent stenosis or other pathology. Fetal type origin of the PCAs with overall diminutive vertebrobasilar system. LE venous doppler - limited exam but no evidence of DVT 2D Echo - EF 60-65% Consider loop recorder if embolic work up negative [employer] Virus 2 - negative LDL - 139 HgbA1c - 5.9 UDS - negative VTE prophylaxis - SCDs No antithrombotic prior to admission, now on aspirin 81 mg daily and Brilinta (ticagrelor) 90 mg bid, Neuro IR to determine duration  Patient will be counseled to be compliant with her antithrombotic medications Ongoing aggressive stroke risk factor management Therapy recommendations:  CIR/SNF  LDL 139 HgbA1c 5.9 VTE prophylaxis - SCD's  Stroke referral placed at discharge    Hypertension Home meds:  Cozaar ; HCTZ ;  metoprolol Stable Long-term BP goal normotensive   Hyperlipidemia Home Lipid lowering medication: none  LDL 139, goal < 70 Lipitor 40 Continue statin at discharge   Diabetes Mellitus Type 2  HGBA1c 5.9 SSI    Other Stroke Risk Factors Advanced Age >/= 53    Other Active Problems Hypothyroidism  - synthyroid daily  CKD stage 3  - Scr 1.07, (unknown base line Scr) Hyponatremia              - Na has fluctuated dropping to the 133, 134 range. Recommend to trend.  Anemia  - Hgb 11.3. will monitor   Stark Jock, NP  Stroke Service Nurse Practitioner  Patient seen and discussed with attending physician Dr. Pearlean Brownie   34 minutes were spent preparing discharge.  I have personally obtained history,examined  this patient, reviewed notes, independently viewed imaging studies, participated in medical decision making and plan of care.ROS completed by me personally and pertinent positives fully documented  I have made any additions or clarifications directly to the above note. Agree with note above.    Delia Heady, Dyer Medical Director [facility] Pager: [phone] 11/25/2020 3:49 PM

## 2020-11-25 NOTE — Progress Notes (Signed)
Inpatient Rehab Admissions Coordinator:   I have insurance approval and a bed available for pt to admit to CIR today.  Awaiting clearance from stroke team to confirm admission.   Estill Dooms, PT, DPT Admissions Coordinator [phone] 11/25/20  1:43 PM

## 2020-11-26 DIAGNOSIS — I63512 Cerebral infarction due to unspecified occlusion or stenosis of left middle cerebral artery: Secondary | ICD-10-CM

## 2020-11-26 LAB — CBC WITH DIFFERENTIAL/PLATELET
Abs Immature Granulocytes: 0.03 10*3/uL (ref 0.00–0.07)
Basophils Absolute: 0.1 10*3/uL (ref 0.0–0.1)
Basophils Relative: 1 %
Eosinophils Absolute: 0.5 10*3/uL (ref 0.0–0.5)
Eosinophils Relative: 6 %
HCT: 30.9 % — ABNORMAL LOW (ref 36.0–46.0)
Hemoglobin: 10.5 g/dL — ABNORMAL LOW (ref 12.0–15.0)
Immature Granulocytes: 0 %
Lymphocytes Relative: 13 %
Lymphs Abs: 1 10*3/uL (ref 0.7–4.0)
MCH: 32.2 pg (ref 26.0–34.0)
MCHC: 34 g/dL (ref 30.0–36.0)
MCV: 94.8 fL (ref 80.0–100.0)
Monocytes Absolute: 0.8 10*3/uL (ref 0.1–1.0)
Monocytes Relative: 10 %
Neutro Abs: 5.4 10*3/uL (ref 1.7–7.7)
Neutrophils Relative %: 70 %
Platelets: 244 10*3/uL (ref 150–400)
RBC: 3.26 MIL/uL — ABNORMAL LOW (ref 3.87–5.11)
RDW: 13.3 % (ref 11.5–15.5)
WBC: 7.7 10*3/uL (ref 4.0–10.5)
nRBC: 0 % (ref 0.0–0.2)

## 2020-11-26 LAB — COMPREHENSIVE METABOLIC PANEL
ALT: 17 U/L (ref 0–44)
AST: 19 U/L (ref 15–41)
Albumin: 2.7 g/dL — ABNORMAL LOW (ref 3.5–5.0)
Alkaline Phosphatase: 43 U/L (ref 38–126)
Anion gap: 12 (ref 5–15)
BUN: 23 mg/dL (ref 8–23)
CO2: 20 mmol/L — ABNORMAL LOW (ref 22–32)
Calcium: 8.9 mg/dL (ref 8.9–10.3)
Chloride: 105 mmol/L (ref 98–111)
Creatinine, Ser: 1.07 mg/dL — ABNORMAL HIGH (ref 0.44–1.00)
GFR, Estimated: 50 mL/min — ABNORMAL LOW (ref >60)
Glucose, Bld: 85 mg/dL (ref 70–99)
Potassium: 3.5 mmol/L (ref 3.5–5.1)
Sodium: 137 mmol/L (ref 135–145)
Total Bilirubin: 0.9 mg/dL (ref 0.3–1.2)
Total Protein: 5.5 g/dL — ABNORMAL LOW (ref 6.5–8.1)

## 2020-11-26 LAB — VITAMIN D 25 HYDROXY (VIT D DEFICIENCY, FRACTURES): Vit D, 25-Hydroxy: 42.81 ng/mL (ref 30–100)

## 2020-11-26 LAB — MAGNESIUM: Magnesium: 2 mg/dL (ref 1.7–2.4)

## 2020-11-26 MED ORDER — FLUCONAZOLE 150 MG PO TABS
150.0000 mg | ORAL_TABLET | ORAL | Status: DC
Start: 2020-11-26 — End: 2020-12-12
  Administered 2020-11-26 – 2020-12-10 (×3): 150 mg via ORAL
  Filled 2020-11-26 (×3): qty 1

## 2020-11-26 MED ORDER — JUVEN PO PACK
1.0000 | PACK | Freq: Two times a day (BID) | ORAL | Status: DC
  Administered 2020-11-26 – 2020-12-09 (×21): 1 via ORAL
  Filled 2020-11-26 (×21): qty 1

## 2020-11-26 NOTE — Evaluation (Signed)
Speech Language Pathology Assessment and Plan  Patient Details  Name: Maria Dyer MRN: [id number] Date of Birth: 1931-03-09  SLP Diagnosis: Aphasia;Speech and Language deficits;Cognitive Impairments  Rehab Potential: Good ELOS: 21-24 days    Today's Date: 11/26/2020 SLP Individual Time: 1000-1100 SLP Individual Time Calculation (min): 60 min   Hospital Problem: Principal Problem:   Left middle cerebral artery stroke [facility])  Past Medical History:  Past Medical History:  Diagnosis Date   Hypertension    Past Surgical History:  Past Surgical History:  Procedure Laterality Date   IR CT HEAD LTD  11/19/2020   IR INTRA CRAN STENT  11/19/2020   IR PERCUTANEOUS ART THROMBECTOMY/INFUSION INTRACRANIAL INC DIAG ANGIO  11/19/2020   IR US GUIDE VASC ACCESS LEFT  11/19/2020   RADIOLOGY WITH ANESTHESIA N/A 11/19/2020   Procedure: IR WITH ANESTHESIA;  Surgeon: Radiologist, Medication, MD;  Location: [facility];  Service: Radiology;  Laterality: N/A;    Assessment / Plan / Recommendation Clinical Impression   Maria Dyer is an 87 year old right-handed female with history of hypertension, severely hearing impaired, breast cancer status postmastectomy 25 years ago, hypothyroidism, hypertension.  Presented 11/19/2020 with aphasia.  Admission chemistries unremarkable except creatinine 1.13.  CT of the head/CT angiogram head and neck, CT cerebral perfusion scan showed no acute intracranial hemorrhage.  Small acute left MCA territory infarct involving inferior insula and adjacent temporal lobe.  Early bifurcation of the left MCA and distal M1 and proximal M2 occlusion and subsequent partial reconstitution.  High-grade stenosis or nonocclusive thrombus within anterior temporal branch approximate 5 mm beyond its origin just proximal to the occlusion.  Perfusion imaging demonstrated 5 mL of core infarct partially corresponding to noncontrast CT abnormality.  No occlusion or hemodynamically significant stenosis  in the neck.  Patient did not receive tPA.  Underwent mechanical thrombectomy per interventional radiology with stenting spanning the left M1-M2 posterior division.  MRI of the head post stenting showed multifocal acute ischemic left MCA distribution infarct involving left parietal and temporal lobes.  Minimal associated petechial hemorrhage without frank intraparenchymal hematoma or mass-effect.  No other acute intracranial abnormality.  Echocardiogram with ejection fraction of 60 to 65% no wall motion abnormalities.  Neurology follow-up maintained on aspirin 81 mg daily as well as Brilinta 90 mg twice daily.  Lower extremity Dopplers negative for DVT.  Awaiting plan for possible need for loop recorder.  Tolerating a regular diet.  W OC follow-up for MASD buttocks and groin with skin care as directed.  Therapy evaluations completed due to patient's aphasia decreased functional mobility was recommended for a comprehensive rehab program.   Pt presents with severe receptive and moderate expressive aphasia with little awareness of deficits. Pt is HOH, but benefited from increase volume and SLP wearing clear mask. Pt can express at word/phrase level with semantic and phonetic paraphasia. Pt can name common objects with 50% accuracy increasing to 90% accuracy with max written sentence completion and written letter cues. Pt demonstrates increased comprehension skills with written communication at word/phrase level verse verbal/demonstration commands, as well as increase carryover of commands once the task is familiar. Pt can respond to yes/no questions in 1 out 5 opportunities (20%), however suspect with written aid accuracy will likely increase. Pt was able to identify common objects in a field of 2 in 2 out 10 opportunities (20%) increasing to 70% accuracy with written aid at word level. Pt was not able to repeat at word level. Pt can write her first/last name, copy 3  letter words, letter level via dictation  increasing to writing at word level/object name via dictation once she was able to name object in 5 out 5 opportunities. Pt was able to read at word level with 70% accuracy and at phrase level in 4 out 5 opportunities. Pt was only orientated to self and unable to carryover use of call bell with written aid, noting likely cognitive deficits in problem solving, intellectual/emergent awareness and possibly recall, however will initially focus on language skills before addressing cognitive skills. Pt's speech appeared appropriate and no acute changes in swallowing per chart review. Pt would benefit from skilled ST services focusing on language and cognition, in order to maximize functional independence and reduce burden of care, requiring 24 hour supervision at discharge with continued skilled ST services.   Skilled Therapeutic Interventions          Skilled ST services focused on language skills. SLP wrote "HELP" on call bell, pt was able to read at word level but required hand over hand assistance to follow command to press "HELP."  Pt was left in room with call bell within reach and chair alarm set. SLP notified nurse of need to check on pt and not being able to utilize call bell independently nor ability to express wants/needs. SLP recommends to continue skilled services.   SLP Assessment  Patient will need skilled Speech Lanaguage Pathology Services during CIR admission    Recommendations  SLP Diet Recommendations: Thin;Age appropriate regular solids Liquid Administration via: Cup;Straw Medication Administration: Whole meds with liquid Supervision: Patient able to self feed Compensations: Minimize environmental distractions Postural Changes and/or Swallow Maneuvers: Seated upright 90 degrees Oral Care Recommendations: Oral care BID Patient destination: Home (with supervision A) Follow up Recommendations: Home Health SLP;Outpatient SLP;24 hour supervision/assistance Equipment Recommended: None  recommended by SLP    SLP Frequency 3 to 5 out of 7 days   SLP Duration  SLP Intensity  SLP Treatment/Interventions 21-24 days  Minumum of 1-2 x/day, 30 to 90 minutes  Cognitive remediation/compensation;Cueing hierarchy;Functional tasks;Internal/external aids;Multimodal communication approach;Patient/family education;Speech/Language facilitation    Pain Pain Assessment Pain Score: 0-No pain  Prior Functioning Cognitive/Linguistic Baseline: Information not available Type of Home: House  Lives With: Alone Available Help at Discharge: Family;Available PRN/intermittently Vocation: Retired  SLP Evaluation Cognition Overall Cognitive Status: Impaired/Different from baseline Arousal/Alertness: Awake/alert Orientation Level: Oriented to person Attention: Sustained Sustained Attention: Impaired Sustained Attention Impairment: Functional basic Awareness: Impaired Awareness Impairment: Emergent impairment Problem Solving: Impaired Problem Solving Impairment: Verbal basic;Functional basic Safety/Judgment: Impaired  Comprehension Auditory Comprehension Overall Auditory Comprehension: Impaired Yes/No Questions: Impaired Basic Biographical Questions: 0-25% accurate Basic Immediate Environment Questions: 0-24% accurate Commands: Impaired One Step Basic Commands: 0-24% accurate Conversation: Simple Interfering Components: Hearing;Other (comment) EffectiveTechniques: Other (Comment);Repetition (written cues) Visual Recognition/Discrimination Discrimination: Exceptions to [facility] Common Objects: Able in field of 2 (with written cues) Reading Comprehension Reading Status: Impaired Word level: Impaired (70%) Sentence Level: Impaired Paragraph Level: Not tested Functional Environmental (signs, name badge): Not tested Expression Expression Primary Mode of Expression: Verbal Verbal Expression Overall Verbal Expression: Impaired Initiation: No impairment Level of  Generative/Spontaneous Verbalization: Word;Phrase Repetition: Impaired Level of Impairment: Word level Naming: Impairment Responsive: Not tested Confrontation: Impaired (50%) Common Objects: Able in field of 2 (with written cues) Convergent: Not tested Divergent: Not tested Verbal Errors: Not aware of errors;Aware of errors;Semantic paraphasias;Phonemic paraphasias Pragmatics: No impairment Interfering Components: Attention Written Expression Dominant Hand: Right Written Expression: Exceptions to [facility] Copy Ability: Word Dictation Ability: Letter;Word Self Formulation Ability:  (  name only) Oral Motor Oral Motor/Sensory Function Overall Oral Motor/Sensory Function: Within functional limits Motor Speech Overall Motor Speech: Appears within functional limits for tasks assessed Respiration: Within functional limits Phonation: Normal Intelligibility: Intelligible Motor Planning: Witnin functional limits Motor Speech Errors: Not applicable  Care Tool Care Tool Cognition Ability to hear (with hearing aid or hearing appliances if normally used Ability to hear (with hearing aid or hearing appliances if normally used): 2. Moderate difficulty - speaker has to increase volume and speak distinctly   Expression of Ideas and Wants Expression of Ideas and Wants: 2. Frequent difficulty - frequently exhibits difficulty with expressing needs and ideas   Understanding Verbal and Non-Verbal Content Understanding Verbal and Non-Verbal Content: 2. Sometimes understands - understands only basic conversations or simple, direct phrases. Frequently requires cues to understand  Memory/Recall Ability Memory/Recall Ability : None of the above were recalled   PMSV Assessment  PMSV Trial Intelligibility: Intelligible     Short Term Goals: Week 1: SLP Short Term Goal 1 (Week 1): Pt will respond to yes/no questions with 70% accuracy with max A written and visual cues. SLP Short Term Goal 2 (Week 1): Pt  follow identify common objects/pictures in a field of 2 with 70% accuracy with mod A written cues. SLP Short Term Goal 3 (Week 1): Pt will name common objects with 80% accuracy with mod A written/sentence completion cues. SLP Short Term Goal 4 (Week 1): Pt will demonstrate self-monitoring and self-correction of verbal errors in 70% of opportunities with max A verbal cues. SLP Short Term Goal 5 (Week 1): Pt will write at word/ 2 word phrase level with 70% accuracy with mod A verbal cues. SLP Short Term Goal 6 (Week 1): Pt will demonstrate reading comprehension at 2 word phrase level by matching phrase to picture in a field 2 with 80% accuracy with mod A verbal cues.  Refer to Care Plan for Long Term Goals  Recommendations for other services: None   Discharge Criteria: Patient will be discharged from SLP if patient refuses treatment 3 consecutive times without medical reason, if treatment goals not met, if there is a change in medical status, if patient makes no progress towards goals or if patient is discharged from hospital.  The above assessment, treatment plan, treatment alternatives and goals were discussed and mutually agreed upon: by patient  Nymir Ringler  [facility] 11/26/2020, 1:59 PM

## 2020-11-26 NOTE — Progress Notes (Signed)
Please see my edits / updates below in BLUE.  Medication discrepancies resolved.  Inpatient Rehabilitation Medication Review by a Pharmacist  A complete drug regimen review was completed for this patient to identify any potential clinically significant medication issues.  High Risk Drug Classes Is patient taking? Indication by Medication  Antipsychotic No   Anticoagulant No   Antibiotic No   Opioid No   Antiplatelet Yes Aspirin and brilinta - antiplatelet for stroke  Hypoglycemics/insulin No   Vasoactive Medication No   Chemotherapy No   Other No      Type of Medication Issue Identified Description of Issue Recommendation(s)  Drug Interaction(s) (clinically significant)  N/A   Duplicate Therapy  N/A   Allergy  N/A   No Medication Administration End Date  N/A   Incorrect Dose  N/A   Additional Drug Therapy Needed  Hydrochlorothiazide- home med and listed to continue from discharge summary F/U in AM  Significant med changes from prior encounter (inform family/care partners about these prior to discharge). New ASA + Brilinta and Atorvastatin for new CVA.   Other  N/A     Clinically significant medication issues were identified that warrant physician communication and completion of prescribed/recommended actions by midnight of the next day:  Yes  Name of provider notified for urgent issues identified: Deatra Ina, PA-C  Provider Method of Notification: secure chat    Pharmacist comments: Informed PA of noted discrepancies in blood pressure medications (Toprol XL and HCTZ).  PA is aware and plans to monitor blood pressure with increased activity in CIR before adjusting doses.   Brandon Melnick 11/26/2020 12:35 PM

## 2020-11-26 NOTE — Progress Notes (Signed)
Inpatient Rehabilitation  Patient information reviewed and entered into eRehab system by Laquonda Welby M. Jaxie Racanelli, M.A., CCC/SLP, PPS Coordinator.  Information including medical coding, functional ability and quality indicators will be reviewed and updated through discharge.    

## 2020-11-26 NOTE — Progress Notes (Signed)
Pharmacy Consult Note  Maria Dyer is a 87 y.o. female admitted to CIR on 11/25/2020 following a stroke.  Patient has some moisture associated skin damage and pharmacy has been consulted for fluconazole dosing.  Plan: Fluconazole 150mg  weekly x 4 doses is the recommended dose. Rx will sign off.  Thank you for allowing pharmacy to be a part of this patient's care.  11/26/2020 12:32 PM

## 2020-11-26 NOTE — Evaluation (Addendum)
Occupational Therapy Assessment and Plan  Patient Details  Name: Maria Dyer MRN: [id number] Date of Birth: Feb 24, 1931  OT Diagnosis: cognitive deficits, hemiplegia affecting dominant side, and muscle weakness (generalized) Rehab Potential: Rehab Potential (ACUTE ONLY): Good ELOS: 3 weeks   Today's Date: 11/26/2020 OT Individual Time: 1400-1500 OT Individual Time Calculation (min): 60 min     Hospital Problem: Principal Problem:   Left middle cerebral artery stroke [facility])   Past Medical History:  Past Medical History:  Diagnosis Date   Hypertension    Past Surgical History:  Past Surgical History:  Procedure Laterality Date   IR CT HEAD LTD  11/19/2020   IR INTRA CRAN STENT  11/19/2020   IR PERCUTANEOUS ART THROMBECTOMY/INFUSION INTRACRANIAL INC DIAG ANGIO  11/19/2020   IR US GUIDE VASC ACCESS LEFT  11/19/2020   RADIOLOGY WITH ANESTHESIA N/A 11/19/2020   Procedure: IR WITH ANESTHESIA;  Surgeon: Radiologist, Medication, MD;  Location: [place];  Service: Radiology;  Laterality: N/A;    Assessment & Plan Clinical Impression: Maria Dyer is an 87 year old right-handed female with history of hypertension, severely hearing impaired, breast cancer status postmastectomy 25 years ago, hypothyroidism, hypertension.  Per chart review patient lives alone.  Household ambulator with walker.  Uses a wheelchair for longer distances in the community.  Independent with ADLs.  She has a niece as well as a grand niece in the area.  Presented 11/19/2020 with aphasia.  Admission chemistries unremarkable except creatinine 1.13.  CT of the head/CT angiogram head and neck, CT cerebral perfusion scan showed no acute intracranial hemorrhage.  Small acute left MCA territory infarct involving inferior insula and adjacent temporal lobe.  Early bifurcation of the left MCA and distal M1 and proximal M2 occlusion and subsequent partial reconstitution.  High-grade stenosis or nonocclusive thrombus within anterior  temporal branch approximate 5 mm beyond its origin just proximal to the occlusion.  Perfusion imaging demonstrated 5 mL of core infarct partially corresponding to noncontrast CT abnormality.  No occlusion or hemodynamically significant stenosis in the neck.  Patient did not receive tPA.  Underwent mechanical thrombectomy per interventional radiology with stenting spanning the left M1-M2 posterior division.  MRI of the head post stenting showed multifocal acute ischemic left MCA distribution infarct involving left parietal and temporal lobes.  Minimal associated petechial hemorrhage without frank intraparenchymal hematoma or mass-effect.  No other acute intracranial abnormality.  Echocardiogram with ejection fraction of 60 to 65% no wall motion abnormalities.  Neurology follow-up maintained on aspirin 81 mg daily as well as Brilinta 90 mg twice daily.  Lower extremity Dopplers negative for DVT.  Awaiting plan for possible need for loop recorder.  Tolerating a regular diet.  W OC follow-up for MASD buttocks and groin with skin care as directed.  Therapy evaluations completed due to patient's aphasia decreased functional mobility was admitted for a comprehensive rehab program. Currently very sleepy. Patient transferred to CIR on 11/25/2020 .    Patient currently requires max with basic self-care skills secondary to muscle weakness, decreased coordination and decreased motor planning, decreased midline orientation, decreased initiation, decreased attention, decreased awareness, decreased problem solving, decreased safety awareness, decreased memory, and delayed processing, and decreased standing balance, decreased postural control, and decreased balance strategies.  Prior to hospitalization, patient could complete ADLs with modified independent .  Patient will benefit from skilled intervention to decrease level of assist with basic self-care skills prior to discharge  assistive living .  Anticipate patient will  require 24 hour supervision and minimal  physical assistance and follow up home health.  OT - End of Session Activity Tolerance: Tolerates 10 - 20 min activity with multiple rests Endurance Deficit: Yes Endurance Deficit Description: global deconditioning and weakness - poor activity tolerance OT Assessment Rehab Potential (ACUTE ONLY): Good OT Barriers to Discharge: Decreased caregiver support OT Barriers to Discharge Comments: family unable to provide care needed, will d/c to assistive living OT Patient demonstrates impairments in the following area(s): Balance;Perception;Cognition;Safety;Skin Integrity;Endurance;Motor;Pain OT Basic ADL's Functional Problem(s): Bathing;Dressing;Toileting;Grooming OT Transfers Functional Problem(s): Toilet OT Additional Impairment(s): None OT Plan OT Intensity: Minimum of 1-2 x/day, 45 to 90 minutes OT Frequency: 5 out of 7 days OT Duration/Estimated Length of Stay: 3 weeks OT Treatment/Interventions: Balance/vestibular training;Discharge planning;Pain management;Self Care/advanced ADL retraining;UE/LE Coordination activities;Therapeutic Activities;Cognitive remediation/compensation;Functional mobility training;Patient/family education;Skin care/wound managment;Therapeutic Exercise;UE/LE Strength taining/ROM;DME/adaptive equipment instruction;Neuromuscular re-education;Wheelchair propulsion/positioning;Psychosocial support OT Self Feeding Anticipated Outcome(s): no goal set OT Basic Self-Care Anticipated Outcome(s): min A OT Toileting Anticipated Outcome(s): min A OT Bathroom Transfers Anticipated Outcome(s): min A OT Recommendation Patient destination: Assisted Living Follow Up Recommendations: Home health OT Equipment Recommended: To be determined   OT Evaluation Precautions/Restrictions  Precautions Precautions: Fall General Chart Reviewed: Yes Family/Caregiver Present: Yes (Niece) Pain Pain Assessment Pain Score: 0-No pain Home Living/Prior  Functioning Home Living Family/patient expects to be discharged to:: Private residence Living Arrangements: Other relatives Available Help at Discharge: Family, Available PRN/intermittently, Friend(s) (no children/husband, niece and niece's family will assit with care) Type of Home: House Home Access: Ramped entrance (home is ADA accessible per niece) Bathroom Shower/Tub: Other (comment) (walk in tub) Additional Comments: Niece at bedside to confirm information. She states pt will d/c to assistive living  Lives With: Alone IADL History Homemaking Responsibilities: Yes Meal Prep Responsibility: Secondary Laundry Responsibility: Secondary Cleaning Responsibility: Secondary Bill Paying/Finance Responsibility: Secondary Homemaking Comments: made coffee every morning, meals brought by family members, previously paid bills online Current License: Yes Occupation: Retired Leisure and Hobbies: watching TV, reading Prior Function Level of Independence: Independent with basic ADLs, Needs assistance with homemaking, Needs assistance with tranfers Vocation: Retired Surveyor, mining Baseline Vision/History: 1 Wears glasses Vision impaired: 01 Patient Visual Report: No change from baseline Vision Assessment?: No apparent visual deficits Perception  Perception: Within Functional Limits Praxis Praxis: Impaired Praxis Impairment Details: Motor planning Cognition Overall Cognitive Status: Impaired/Different from baseline Arousal/Alertness: Awake/alert Orientation Level: Nonverbal/unable to assess (pt with global aphasia and very HOH- so difficult to assess any cognition) Immediate Memory Recall:  (unable to assess) Attention: Sustained Sustained Attention: Impaired Sustained Attention Impairment: Functional basic Awareness: Impaired Awareness Impairment: Intellectual impairment Problem Solving: Impaired Problem Solving Impairment: Verbal basic;Functional basic Behaviors:  Perseveration Safety/Judgment: Impaired Sensation Sensation Light Touch: Appears Intact Hot/Cold: Appears Intact Proprioception: Appears Intact Stereognosis: Appears Intact Additional Comments: unable to accurately assess due to cognition and aphasia. Functionally, sensation appears intact but will continue to evaluate. Coordination Gross Motor Movements are Fluid and Coordinated: No Fine Motor Movements are Fluid and Coordinated: No Coordination and Movement Description: Poor motor planning, significant fear of falling that impacts movement Motor  Motor Motor: Motor apraxia;Hemiplegia Motor - Skilled Clinical Observations: Mild R hemi  Trunk/Postural Assessment  Cervical Assessment Cervical Assessment: Exceptions to [facility] (forward head) Thoracic Assessment Thoracic Assessment: Exceptions to [facility] (rounded shoulders) Lumbar Assessment Lumbar Assessment: Exceptions to [facility] (posterior pelvic tilt) Postural Control Postural Control: Deficits on evaluation Righting Reactions: delayed Protective Responses: delayed  Balance Balance Balance Assessed: Yes Static Sitting Balance Static Sitting - Balance Support: Feet supported;No upper extremity supported Static  Sitting - Level of Assistance: 5: Stand by assistance Dynamic Sitting Balance Dynamic Sitting - Balance Support: Feet supported Dynamic Sitting - Level of Assistance: 4: Min assist Static Standing Balance Static Standing - Balance Support: Bilateral upper extremity supported Static Standing - Level of Assistance: 3: Mod assist Dynamic Standing Balance Dynamic Standing - Balance Support: During functional activity Dynamic Standing - Level of Assistance: 2: Max assist Extremity/Trunk Assessment RUE Assessment RUE Assessment: Exceptions to [facility] General Strength Comments: 3+/5 LUE Assessment LUE Assessment: Exceptions to [facility] General Strength Comments: 3+/5  Care Tool Care Tool Self Care Eating Eating activity did not occur:  Refused      Oral Care  Oral care, brush teeth, clean dentures activity did not occur: Refused      Bathing   Body parts bathed by patient: Right arm;Left arm;Chest;Abdomen;Face;Right upper leg;Left upper leg Body parts bathed by helper: Front perineal area;Buttocks;Right lower leg;Left lower leg   Assist Level: 2 Helpers    Upper Body Dressing(including orthotics)   What is the patient wearing?: Pull over shirt   Assist Level: Minimal Assistance - Patient > 75%    Lower Body Dressing (excluding footwear)   What is the patient wearing?: Pants;Incontinence brief Assist for lower body dressing: 2 Helpers    Putting on/Taking off footwear   What is the patient wearing?: Non-skid slipper socks Assist for footwear: 2 Helpers       Care Tool Toileting Toileting activity   Assist for toileting: 2 Helpers     Care Tool Bed Mobility Roll left and right activity   Roll left and right assist level: Minimal Assistance - Patient > 75%    Sit to lying activity   Sit to lying assist level: Moderate Assistance - Patient 50 - 74%    Lying to sitting on side of bed activity   Lying to sitting on side of bed assist level: the ability to move from lying on the back to sitting on the side of the bed with no back support.: Moderate Assistance - Patient 50 - 74%     Care Tool Transfers Sit to stand transfer   Sit to stand assist level: Maximal Assistance - Patient 25 - 49%    Chair/bed transfer   Chair/bed transfer assist level: Maximal Assistance - Patient 25 - 49%     Toilet transfer   Assist Level: 2 Helpers     Care Tool Cognition  Expression of Ideas and Wants Expression of Ideas and Wants: 2. Frequent difficulty - frequently exhibits difficulty with expressing needs and ideas  Understanding Verbal and Non-Verbal Content Understanding Verbal and Non-Verbal Content: 2. Sometimes understands - understands only basic conversations or simple, direct phrases. Frequently requires cues  to understand   Memory/Recall Ability Memory/Recall Ability : None of the above were recalled   Refer to Care Plan for Long Term Goals  SHORT TERM GOAL WEEK 1 OT Short Term Goal 1 (Week 1): Pt will don LB clothing with max A OT Short Term Goal 2 (Week 1): Pt will complete sit > stand with mod A OT Short Term Goal 3 (Week 1): Pt will complete stand pivot with mod A  Recommendations for other services: None    Skilled Therapeutic Intervention Skilled OT evaluation completed. Pt's niece present and helpful throughout session in determining PLOF. She reports pt will d/c to assistive living, unclear how much physical assist they will provide at this facility. Eval as described below. Pt with fear of falling- impacts movement and  transfers, as well as cognitive/communication deficits impacting ability to follow directions. Pt left in w/c with all needs met, chair alarm set.   ADL ADL Eating: Supervision/safety Where Assessed-Eating: Other (comment) Grooming: Minimal assistance Where Assessed-Grooming: Sitting at sink Upper Body Bathing: Minimal assistance Where Assessed-Upper Body Bathing: Sitting at sink;Wheelchair Lower Body Bathing: Dependent Where Assessed-Lower Body Bathing: Sitting at sink Upper Body Dressing: Minimal assistance Where Assessed-Upper Body Dressing: Sitting at sink;Wheelchair Lower Body Dressing: Dependent Where Assessed-Lower Body Dressing: Standing at sink Toileting: Dependent Where Assessed-Toileting: Bedside Commode Toilet Transfer: Maximal verbal cueing;Dependent Toilet Transfer Method: (P) Stand pivot Toilet Transfer Equipment: Bedside commode Tub/Shower Transfer: Unable to assess Social research officer, government: Unable to assess Mobility  Bed Mobility Bed Mobility: Rolling Right;Rolling Left;Supine to Sit;Sit to Supine Rolling Right: Minimal Assistance - Patient > 75% Rolling Left: Minimal Assistance - Patient > 75% Supine to Sit: Moderate Assistance - Patient  50-74% Sit to Supine: Moderate Assistance - Patient 50-74% Transfers Sit to Stand: Maximal Assistance - Patient 25-49% Stand to Sit: Moderate Assistance - Patient 50-74%   Discharge Criteria: Patient will be discharged from OT if patient refuses treatment 3 consecutive times without medical reason, if treatment goals not met, if there is a change in medical status, if patient makes no progress towards goals or if patient is discharged from hospital.  The above assessment, treatment plan, treatment alternatives and goals were discussed and mutually agreed upon: by family  Curtis Sites 11/26/2020, 3:19 PM

## 2020-11-26 NOTE — Discharge Instructions (Addendum)
Inpatient Rehab Discharge Instructions  Maria Dyer Discharge date and time: No discharge date for patient encounter.   Activities/Precautions/ Functional Status: Activity: activity as tolerated Diet: regular diet Wound Care: Routine skin checks Functional status:  ___ No restrictions     ___ Walk up steps independently ___ 24/7 supervision/assistance   ___ Walk up steps with assistance ___ Intermittent supervision/assistance  ___ Bathe/dress independently ___ Walk with walker     _x__ Bathe/dress with assistance ___ Walk Independently    ___ Shower independently ___ Walk with assistance    ___ Shower with assistance ___ No alcohol     ___ Return to work/school ________  Special Instructions: No driving smoking or alcoholSTROKE/TIA DISCHARGE INSTRUCTIONS  COMMUNITY REFERRALS UPON DISCHARGE:    Home Health:   PT, OT, SP [place] TO ARRANGE THE ORDER FOR FOLLOW UP                Medical Equipment/Items Ordered:[facility]                                                 Agency/Supplier:[facility]  [phone]    SMOKING Cigarette smoking nearly doubles your risk of having a stroke & is the single most alterable risk factor  If you smoke or have smoked in the last 12 months, you are advised to quit smoking for your health. Most of the excess cardiovascular risk related to smoking disappears within a year of stopping. Ask you doctor about anti-smoking medications [place] Quit Line: 1-800-QUIT NOW Free Smoking Cessation Classes [phone]  CHOLESTEROL Know your levels; limit fat & cholesterol in your diet  Lipid Panel     Component Value Date/Time   CHOL 205 (H) 11/20/2020 0446   TRIG 91 11/20/2020 0446   HDL 48 11/20/2020 0446   CHOLHDL 4.3 11/20/2020 0446   VLDL 18 11/20/2020 0446   LDLCALC 139 (H) 11/20/2020 0446     Many patients benefit from treatment even if their cholesterol is at goal. Goal: Total Cholesterol (CHOL) less than 160 Goal:   Triglycerides (TRIG) less than 150 Goal:  HDL greater than 40 Goal:  LDL (LDLCALC) less than 100   BLOOD PRESSURE American Stroke Association blood pressure target is less that 120/80 mm/Hg  Your discharge blood pressure is:  BP: (!) 154/64 Monitor your blood pressure Limit your salt and alcohol intake Many individuals will require more than one medication for high blood pressure  DIABETES (A1c is a blood sugar average for last 3 months) Goal HGBA1c is under 7% (HBGA1c is blood sugar average for last 3 months)  Diabetes: No known diagnosis of diabetes    Lab Results  Component Value Date   HGBA1C 5.9 (H) 11/20/2020    Your HGBA1c can be lowered with medications, healthy diet, and exercise. Check your blood sugar as directed by your physician Call your physician if you experience unexplained or low blood sugars.  PHYSICAL ACTIVITY/REHABILITATION Goal is 30 minutes at least 4 days per week  Activity: Increase activity slowly, Therapies: Physical Therapy: Home Health Return to work:  Activity decreases your risk of heart attack and stroke and makes your heart stronger.  It helps control your weight and blood pressure; helps you relax and can improve your mood. Participate in a regular exercise program. Talk with your doctor about the best form of  exercise for you (dancing, walking, swimming, cycling).  DIET/WEIGHT Goal is to maintain a healthy weight  Your discharge diet is:  Diet Order             Diet regular Room service appropriate? Yes; Fluid consistency: Thin  Diet effective now                   liquids Your height is:    Your current weight is: Weight: 65.6 kg Your Body Mass Index (BMI) is:  BMI (Calculated): 21.35 Following the type of diet specifically designed for you will help prevent another stroke. Your goal weight range is:   Your goal Body Mass Index (BMI) is 19-24. Healthy food habits can help reduce 3 risk factors for stroke:  High cholesterol, hypertension,  and excess weight.  RESOURCES Stroke/Support Group:  Call [phone]   STROKE EDUCATION PROVIDED/REVIEWED AND GIVEN TO PATIENT Stroke warning signs and symptoms How to activate emergency medical system (call 911). Medications prescribed at discharge. Need for follow-up after discharge. Personal risk factors for stroke. Pneumonia vaccine given: No Flu vaccine given: No My questions have been answered, the writing is legible, and I understand these instructions.  I will adhere to these goals & educational materials that have been provided to me after my discharge from the hospital.       My questions have been answered and I understand these instructions. I will adhere to these goals and the provided educational materials after my discharge from the hospital.  Patient/Caregiver Signature _______________________________ Date __________  Clinician Signature _______________________________________ Date __________  Please bring this form and your medication list with you to all your follow-up doctor's appointments.

## 2020-11-26 NOTE — Progress Notes (Signed)
Patient ID: Maria Dyer, female   DOB: [birth year], 87 y.o.   MRN: [id number] Met with the patient to introduce self. Patient very [place] and with global aphasia; little conversation however was able to follow directions with written instructions and visual cues and some time to process request. Patient with MASD to buttocks, peri-area and bil groin; right worse than left. Zinc cream applied to area; initiated derma therapy for right groin and continue zinc to left groin and buttocks. Will need to review meds and care with niece/nephew. Continue to follow along to discharge to address educational needs and collaborate with the team to facilitate preparation for discharge. Margarito Liner, RN

## 2020-11-26 NOTE — Patient Care Conference (Signed)
Inpatient RehabilitationTeam Conference and Plan of Care Update Date: 11/26/2020   Time: 13:00 PM    Patient Name: Maria Dyer      Medical Record Number: [id number]  Date of Birth: 20-Jan-1931 Sex: Female         Room/Bed: 4M09C/4M09C-01 Payor Info: Payor: [employer] MEDICARE / Plan: [employer] MEDICARE HMO/PPO / Product Type: *No Product type* /    Admit Date/Time:  11/25/2020  6:19 PM  Primary Diagnosis:  Left middle cerebral artery stroke [facility])  Hospital Problems: Principal Problem:   Left middle cerebral artery stroke [facility])    Expected Discharge Date: Expected Discharge Date:  (evals pending)  Team Members Present: Physician leading conference: Dr. Sula Soda Social Worker Present: Dossie Der, LCSW Nurse Present: Chana Bode, RN PT Present: Wynelle Link, PT SLP Present: Colin Benton, SLP     Current Status/Progress Goal Weekly Team Focus  Bowel/Bladder   incont of B/B. Last BM-8/29  regain cont.  assess toileting q 2-3 hours   Swallow/Nutrition/ Hydration             ADL's             Mobility   modA bed mobility, mod to maxA for sit<>stand transfers, maxA for squat<>pivot transfers. Refused most mobility assessments on evaluation  minA  improving participation, functional transfer, initiating gait training, confirming with family level of support and home setup   Communication   max A receptive and mod A expressive  Min-Supervision A  yes/no, writting/reading word/phrase level, following 1 commands, naming objects, express wants/needs with multimodal written/communication board   Safety/Cognition/ Behavioral Observations  focusing on communication first, but later will focus on awareness and problem solving, Max A error awareness  Min A error awareness  error awareness   Pain   no c/o pain  pain <3  assess pain q shift and PRN   Skin   st 2 buttoks, cracking in groin area  healing PU and cracking in groin area  assess skin q shift and PRN, continue TX.      Discharge Planning:      Team Discussion: MASD to groin and buttocks treated. AKI noted; MD encourage po fluids. UA completed and monitoring for symptoms. Patient with mild hemiparesis on right post CVA with global aphasia Patient on target to meet rehab goals: Currently not able to follow verbal commands; requires written word commands and cues. Able to read at the word level and comprehends at the phrase level. Patient with little participation in therapy and demonstrates little awareness of errors or deficits. Requires max assist for sit - stand and stand pivot transfers. Goals for discharge set at min assist.  *See Care Plan and progress notes for long and short-term goals.   Revisions to Treatment Plan:  Use written communication tools for cues/commands Working on basic problem solving, error awareness  Teaching Needs: Medications, dietary modifications, safety, etc  Current Barriers to Discharge: Home enviroment access/layout, Incontinence, Wound care, and Lack of/limited family support  Possible Resolutions to Barriers: Family education     Medical Summary Current Status: hard of hearing, MASD, UA positive, AKI  Barriers to Discharge: Medical stability  Barriers to Discharge Comments: hard of hearing, MASD, UA positive, AKI Possible Resolutions to [employer] Focus: diflucan started as per pharamcy consult, will not treat UA unless symptomatic, encourage 6-8 glasses of water per day and repeat creatinine tomorrow   Continued Need for Acute Rehabilitation Level of Care: The patient requires daily medical management by a physician  with specialized training in physical medicine and rehabilitation for the following reasons: Direction of a multidisciplinary physical rehabilitation program to maximize functional independence : Yes Medical management of patient stability for increased activity during participation in an intensive rehabilitation regime.: Yes Analysis of  laboratory values and/or radiology reports with any subsequent need for medication adjustment and/or medical intervention. : Yes   I attest that I was present, lead the team conference, and concur with the assessment and plan of the team.   Pamelia Hoit 11/26/2020, 2:28 PM

## 2020-11-26 NOTE — Progress Notes (Signed)
PROGRESS NOTE   Subjective/Complaints: Mrs. Adron Bene expresses no complaints this morning Team conference today She has bruising over her arms and a scab on her face CXR reviewed and stable  ROS: +hearing loss  Objective:   DG Chest 1 View  Result Date: 11/24/2020 CLINICAL DATA:  Aspiration into airway. EXAM: CHEST  1 VIEW COMPARISON:  None. FINDINGS: Scoliotic curvature of the thoracolumbar spine. No pneumothorax. Healed left rib fractures. The heart, hila, and mediastinum are unremarkable with no acute abnormalities. Calcified atherosclerosis is seen in the thoracic aorta. No pulmonary nodules or masses. No focal infiltrates. No acute abnormalities. IMPRESSION: No active disease. Electronically Signed   By: Gerome Sam III M.D.   On: 11/24/2020 13:36   Recent Labs    11/25/20 0102 11/26/20 0514  WBC 8.5 7.7  HGB 11.0* 10.5*  HCT 32.7* 30.9*  PLT 275 244   Recent Labs    11/25/20 0102 11/26/20 0514  NA 134* 137  K 4.0 3.5  CL 104 105  CO2 22 20*  GLUCOSE 107* 85  BUN 26* 23  CREATININE 1.21* 1.07*  CALCIUM 8.8* 8.9    Intake/Output Summary (Last 24 hours) at 11/26/2020 1111 Last data filed at 11/26/2020 5573 Gross per 24 hour  Intake 240 ml  Output 100 ml  Net 140 ml     Pressure Injury 11/19/20 Buttocks Medial Stage 2 -  Partial thickness loss of dermis presenting as a shallow open injury with a red, pink wound bed without slough. pink, raw area between buttocks (Active)  11/19/20 1815  Location: Buttocks  Location Orientation: Medial  Staging: Stage 2 -  Partial thickness loss of dermis presenting as a shallow open injury with a red, pink wound bed without slough.  Wound Description (Comments): pink, raw area between buttocks  Present on Admission: Yes    Physical Exam: Vital Signs Blood pressure (!) 154/64, pulse 76, temperature (!) 97.5 F (36.4 C), temperature source Oral, resp. rate 17,  height 5\' 9"  (1.753 m), weight 59.3 kg, SpO2 100 %. Gen: no distress, normal appearing HEENT: oral mucosa pink and moist, NCAT, hard of hearing Cardio: Reg rate Chest: normal effort, normal rate of breathing Abd: soft, non-distended Ext: no edema Psych: pleasant, normal affect Skin: intact Neurological:     Comments: Patient is severely hard of hearing.  Patient is awake alert, eyes are open.  Globally aphasic however she did have some short intermittent meaningful  sentences. She did not follow commands. ModA gait within room   Assessment/Plan: 1. Functional deficits which require 3+ hours per day of interdisciplinary therapy in a comprehensive inpatient rehab setting. Physiatrist is providing close team supervision and 24 hour management of active medical problems listed below. Physiatrist and rehab team continue to assess barriers to discharge/monitor patient progress toward functional and medical goals  Care Tool:  Bathing              Bathing assist       Upper Body Dressing/Undressing Upper body dressing        Upper body assist      Lower Body Dressing/Undressing Lower body dressing  Lower body assist Assist for lower body dressing: Moderate Assistance - Patient 50 - 74%     Toileting Toileting    Toileting assist Assist for toileting: Moderate Assistance - Patient 50 - 74%     Transfers Chair/bed transfer  Transfers assist     Chair/bed transfer assist level: Maximal Assistance - Patient 25 - 49%     Locomotion Ambulation   Ambulation assist   Ambulation activity did not occur: Refused          Walk 10 feet activity   Assist  Walk 10 feet activity did not occur: Refused        Walk 50 feet activity   Assist Walk 50 feet with 2 turns activity did not occur: Refused         Walk 150 feet activity   Assist Walk 150 feet activity did not occur: Refused         Walk 10 feet on uneven surface   activity   Assist Walk 10 feet on uneven surfaces activity did not occur: Refused         Wheelchair     Assist Is the patient using a wheelchair?: Yes Type of Wheelchair: Manual    Wheelchair assist level: Dependent - Patient 0%      Wheelchair 50 feet with 2 turns activity    Assist        Assist Level: Dependent - Patient 0%   Wheelchair 150 feet activity     Assist      Assist Level: Dependent - Patient 0%   Blood pressure (!) 154/64, pulse 76, temperature (!) 97.5 F (36.4 C), temperature source Oral, resp. rate 17, height 5\' 9"  (1.753 m), weight 59.3 kg, SpO2 100 %.  Medical Problem List and Plan: 1.  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  Antithrombotics: -DVT/anticoagulation:  Mechanical: Sequential compression devices, below knee Bilateral lower extremities             -antiplatelet therapy: Aspirin 81 mg daily and Brilinta 90 mg twice daily 3. Pain Management: Tylenol as needed 4. Mood: Provide emotional support             -antipsychotic agents: N/A 5. Neuropsych: This patient is not capable of making decisions on her own behalf. 6. Skin/Wound Care: Routine skin checks 7. Fluids/Electrolytes/Nutrition: Routine in and outs with follow-up chemistries 8.  Hyperlipidemia.  Lipitor 9.  Hypothyroidism.  Synthroid 10.  Hypertension.  Labile, continue Cozaar 100 mg daily, Toprol-XL 50 mg daily.  Monitor with increased mobility. Magnesium level reviewed and normal 11.  MASD buttocks and groin.  Zinc oxide as directed with routine skin checks. Ordered diflucan per pharmacy consult.  12. AKI: Improving. Placed nursing order to encourage 6-8 glasses of water per day, repeat BMP tomorrow.     LOS: 1 days A FACE TO FACE EVALUATION WAS PERFORMED  Adaysha Dubinsky P  Liahm Grivas 11/26/2020, 11:11 AM

## 2020-11-26 NOTE — Evaluation (Signed)
Physical Therapy Assessment and Plan  Patient Details  Name: Maria Dyer MRN: [id number] Date of Birth: Sep 25, 1930  PT Diagnosis: Abnormal posture, Abnormality of gait, Cognitive deficits, Difficulty walking, Hemiparesis dominant, Impaired cognition, and Muscle weakness Rehab Potential: Fair ELOS: 2-3 weeks   Today's Date: 11/26/2020 PT Individual Time: 1130-1157 + 1130-1157 PT Individual Time Calculation (min): 27 min  + 27 min  Hospital Problem: Principal Problem:   Left middle cerebral artery stroke [facility])   Past Medical History:  Past Medical History:  Diagnosis Date   Hypertension    Past Surgical History:  Past Surgical History:  Procedure Laterality Date   IR CT HEAD LTD  11/19/2020   IR INTRA CRAN STENT  11/19/2020   IR PERCUTANEOUS ART THROMBECTOMY/INFUSION INTRACRANIAL INC DIAG ANGIO  11/19/2020   IR US GUIDE VASC ACCESS LEFT  11/19/2020   RADIOLOGY WITH ANESTHESIA N/A 11/19/2020   Procedure: IR WITH ANESTHESIA;  Surgeon: Radiologist, Medication, MD;  Location: [place];  Service: Radiology;  Laterality: N/A;    Assessment & Plan Clinical Impression: Patient is an 87 year old right-handed female with history of hypertension, severely hearing impaired, breast cancer status postmastectomy 25 years ago, hypothyroidism, hypertension.  Per chart review patient lives alone.  Household ambulator with walker.  Uses a wheelchair for longer distances in the community.  Independent with ADLs.  She has a niece as well as a grand niece in the area.  Presented 11/19/2020 with aphasia.  Admission chemistries unremarkable except creatinine 1.13.  CT of the head/CT angiogram head and neck, CT cerebral perfusion scan showed no acute intracranial hemorrhage.  Small acute left MCA territory infarct involving inferior insula and adjacent temporal lobe.  Early bifurcation of the left MCA and distal M1 and proximal M2 occlusion and subsequent partial reconstitution.  High-grade stenosis or nonocclusive  thrombus within anterior temporal branch approximate 5 mm beyond its origin just proximal to the occlusion.  Perfusion imaging demonstrated 5 mL of core infarct partially corresponding to noncontrast CT abnormality.  No occlusion or hemodynamically significant stenosis in the neck.  Patient did not receive tPA.  Underwent mechanical thrombectomy per interventional radiology with stenting spanning the left M1-M2 posterior division.  MRI of the head post stenting showed multifocal acute ischemic left MCA distribution infarct involving left parietal and temporal lobes.  Minimal associated petechial hemorrhage without frank intraparenchymal hematoma or mass-effect.  No other acute intracranial abnormality.  Echocardiogram with ejection fraction of 60 to 65% no wall motion abnormalities.  Neurology follow-up maintained on aspirin 81 mg daily as well as Brilinta 90 mg twice daily.  Lower extremity Dopplers negative for DVT.  Awaiting plan for possible need for loop recorder.  Tolerating a regular diet.  W OC follow-up for MASD buttocks and groin with skin care as directed.  Therapy evaluations completed due to patient's aphasia decreased functional mobility was admitted for a comprehensive rehab program. Patient transferred to CIR on 11/25/2020 .   Patient currently requires max with mobility secondary to muscle weakness, decreased cardiorespiratoy endurance, unbalanced muscle activation and decreased motor planning, decreased motor planning, decreased initiation, decreased attention, decreased awareness, decreased problem solving, decreased safety awareness, decreased memory, and delayed processing, and decreased sitting balance, decreased standing balance, decreased postural control, hemiplegia, and decreased balance strategies.  Prior to hospitalization, patient was modified independent  with mobility and lived with Alone in a House home.  Home access is   .  Patient will benefit from skilled PT intervention to  maximize safe functional mobility, minimize  fall risk, and decrease caregiver burden for planned discharge home with 24 hour assist.  Anticipate patient will benefit from follow up [place] at discharge.  PT - End of Session Activity Tolerance: Tolerates < 10 min activity, no significant change in vital signs Endurance Deficit: Yes Endurance Deficit Description: global deconditioning and weakness - poor activity tolerance PT Assessment Rehab Potential (ACUTE/IP ONLY): Fair PT Barriers to Discharge: Decreased caregiver support;Home environment access/layout;Incontinence;Lack of/limited family support;Insurance for SNF coverage;Behavior PT Barriers to Discharge Comments: significant aphasia PT Patient demonstrates impairments in the following area(s): Balance;Behavior;Endurance;Motor;Safety;Skin Integrity PT Transfers Functional Problem(s): Bed Mobility;Bed to Chair;Car PT Locomotion Functional Problem(s): Ambulation;Stairs PT Plan PT Intensity: Minimum of 1-2 x/day ,45 to 90 minutes PT Frequency: 5 out of 7 days PT Duration Estimated Length of Stay: 2-3 weeks PT Treatment/Interventions: Ambulation/gait training;Discharge planning;Functional mobility training;Psychosocial support;Therapeutic Activities;Visual/perceptual remediation/compensation;Wheelchair propulsion/positioning;Therapeutic Exercise;Skin care/wound management;Neuromuscular re-education;Disease management/prevention;Balance/vestibular training;Cognitive remediation/compensation;DME/adaptive equipment instruction;Pain management;Splinting/orthotics;UE/LE Strength taining/ROM;UE/LE Coordination activities;Patient/family education;Stair training;Community reintegration PT Transfers Anticipated Outcome(s): minA PT Locomotion Anticipated Outcome(s): minA PT Recommendation Follow Up Recommendations: Home health PT;Skilled nursing facility;Other (comment);24 hour supervision/assistance (if 24/7 assist is unavailable, will likely require  SNF) Patient destination: Home Equipment Recommended: To be determined   PT Evaluation Precautions/Restrictions Precautions Precautions: Fall Precaution Comments: receptive > expressive aphasia, HoH Restrictions Weight Bearing Restrictions: No Pain Pain Assessment Pain Scale: 0-10 Pain Score: 0-No pain Pain Interference Pain Interference Pain Effect on Sleep: 8. Unable to answer (aphasia) Pain Interference with Therapy Activities: 8. Unable to answer (aphasia) Pain Interference with Day-to-Day Activities: 8. Unable to answer (aphasia) Home Living/Prior Functioning Home Living Available Help at Discharge: Family;Available PRN/intermittently Type of Home: House Additional Comments: No family at bedside to confirm home setup, prior level of function, or social factors. Will need to confirm when family present.  Lives With: Alone Vision/Perception  Perception Perception: Within Functional Limits Praxis Praxis: Intact  Cognition Overall Cognitive Status: Impaired/Different from baseline Arousal/Alertness: Awake/alert Orientation Level: Oriented to person;Other (comment) (difficult to assess due to aphasia) Sensation Sensation Light Touch: Appears Intact Hot/Cold: Appears Intact Proprioception: Appears Intact Stereognosis: Appears Intact Additional Comments: unable to accurately assess due to cognition and aphasia. Functionally, sensation appears intact but will continue to evaluate. Coordination Gross Motor Movements are Fluid and Coordinated: No Coordination and Movement Description: Strong retro bias in standing, blocked movement patterns, poor motor planning Heel Shin Test: UTA due to aphasia Motor  Motor Motor: Motor apraxia;Hemiplegia Motor - Skilled Clinical Observations: Mild R hemi   Trunk/Postural Assessment  Cervical Assessment Cervical Assessment: Exceptions to [facility] (forward head) Thoracic Assessment Thoracic Assessment: Exceptions to [facility] (rounded  shoulders, kyphosis) Lumbar Assessment Lumbar Assessment: Exceptions to [facility] (posterior pelvic tilt) Postural Control Postural Control: Deficits on evaluation Righting Reactions: delayed Protective Responses: delayed  Balance Balance Balance Assessed: Yes Static Sitting Balance Static Sitting - Balance Support: Feet supported;No upper extremity supported Static Sitting - Level of Assistance: 5: Stand by assistance Dynamic Sitting Balance Dynamic Sitting - Balance Support: Feet supported Dynamic Sitting - Level of Assistance: 4: Min assist Static Standing Balance Static Standing - Balance Support: Bilateral upper extremity supported Static Standing - Level of Assistance: 3: Mod assist Dynamic Standing Balance Dynamic Standing - Balance Support: During functional activity Dynamic Standing - Level of Assistance: 2: Max assist Extremity Assessment      RLE Assessment RLE Assessment: Exceptions to [facility] General Strength Comments: Ankle DF 4-/5, knee ext 4-/5, hip flex 2+/5 LLE Assessment LLE Assessment: Within Functional Limits  Care Tool Care Tool Bed  Mobility Roll left and right activity   Roll left and right assist level: Minimal Assistance - Patient > 75%    Sit to lying activity   Sit to lying assist level: Moderate Assistance - Patient 50 - 74%    Lying to sitting on side of bed activity   Lying to sitting on side of bed assist level: the ability to move from lying on the back to sitting on the side of the bed with no back support.: Moderate Assistance - Patient 50 - 74%     Care Tool Transfers Sit to stand transfer   Sit to stand assist level: Maximal Assistance - Patient 25 - 49%    Chair/bed transfer   Chair/bed transfer assist level: Maximal Assistance - Patient 25 - 49%     Psychologist, counselling transfer activity did not occur: Safety/medical concerns        Care Tool Locomotion Ambulation Ambulation activity did not occur: Refused         Walk 10 feet activity Walk 10 feet activity did not occur: Refused       Walk 50 feet with 2 turns activity Walk 50 feet with 2 turns activity did not occur: Refused      Walk 150 feet activity Walk 150 feet activity did not occur: Refused      Walk 10 feet on uneven surfaces activity Walk 10 feet on uneven surfaces activity did not occur: Refused      Stairs Stair activity did not occur: Refused        Walk up/down 1 step activity Walk up/down 1 step or curb (drop down) activity did not occur: Refused     Walk up/down 4 steps activity did not occuR: Refused  Walk up/down 4 steps activity      Walk up/down 12 steps activity Walk up/down 12 steps activity did not occur: Refused      Pick up small objects from floor Pick up small object from the floor (from standing position) activity did not occur: Refused      Wheelchair Is the patient using a wheelchair?: Yes Type of Wheelchair: Manual   Wheelchair assist level: Dependent - Patient 0%    Wheel 50 feet with 2 turns activity   Assist Level: Dependent - Patient 0%  Wheel 150 feet activity   Assist Level: Dependent - Patient 0%    Refer to Care Plan for Long Term Goals  SHORT TERM GOAL WEEK 1 PT Short Term Goal 1 (Week 1): Pt will complete supine<>sit with minA PT Short Term Goal 2 (Week 1): Pt will complete sit<>stand transfers with minA PT Short Term Goal 3 (Week 1): Pt will complete bed<>chair transfers with modA PT Short Term Goal 4 (Week 1): Pt will initiate gait training  Recommendations for other services: None   Skilled Therapeutic Intervention Mobility Bed Mobility Bed Mobility: Rolling Right;Rolling Left;Supine to Sit;Sit to Supine Rolling Right: Minimal Assistance - Patient > 75% Rolling Left: Minimal Assistance - Patient > 75% Supine to Sit: Moderate Assistance - Patient 50-74% Sit to Supine: Moderate Assistance - Patient 50-74% Transfers Transfers: Sit to Stand;Stand to Sit;Stand Pivot  Transfers Sit to Stand: Maximal Assistance - Patient 25-49% Stand to Sit: Moderate Assistance - Patient 50-74% Stand Pivot Transfers: Maximal Assistance - Patient 25 - 49% Stand Pivot Transfer Details: Tactile cues for initiation;Tactile cues for sequencing;Tactile cues for posture;Tactile cues for weight shifting;Verbal cues for sequencing;Verbal cues for  technique;Visual cues/gestures for sequencing;Verbal cues for precautions/safety;Verbal cues for gait pattern;Verbal cues for safe use of DME/AE Transfer (Assistive device): 1 person hand held assist Locomotion  Gait Ambulation: No (pt refused on eval) Gait Gait: No Stairs / Additional Locomotion Stairs: No Wheelchair Mobility Wheelchair Mobility: Yes Wheelchair Assistance: Dependent - Patient 0% Wheelchair Propulsion: Both upper extremities  Skilled Intervention:  1st session: Pt supine in bed at start. Initiated PT evaluation with functional mobility outlined above. She's globally aphasic but does gesture at times for her needs/wants. Aspects of evlauation difficult to assess due to cognition and aphasia. She requires minA to roll in bed, modA for sit>supine, and maxA for stand<>pivot transfers. She required modA for donning pants and minA for donning shirt. Once wheeled to main rehab gym, she refused to attempt to transfer to mat table and also refused to trial ambulation in the // bars. Reports, "please, leave me alone. I'm 800 and I don't want to do this." She did participate in a few seated exercises with convincing, such as LAQ and hip marches with 2# ankle weight. Pt returned to her room, remained seated in w/c safety belt alarm on, all needs within reach. RN notified at end of session regarding pt's mobility and status.  Instructed pt in results of PT evaluation as detailed above, PT POC, rehab potential, rehab goals, and discharge recommendations. Additionally discussed CIR's policies regarding fall safety and use of chair alarm  and/or quick release belt. Pt verbalized understanding and in agreement. Will update pt's family members as they become available.   2nd session: Pt seated in w/c to start. She's initially agreeable to therapy. Wrote down "wallk?" On whiteboard and pt repeated "walk" and said "yeah." Pt wheeled down to main rehab gym in w/c for time. Wheeled in // bars and setup to perform sit<>stand but then pt began refusing just like in earlier session. Multiple efforts at encouragement but she would say "it's too hard" and "please, no." Pt then wheeled to high/low table to try and work on just standing tolerance and static standing balance. Provided her a puzzle to attempt but she again refused. Wheeled her to ADL apartment to attempt familiarity of tasks but she again refused. Returned to her room and assisted back to bed via squat<>pivot with maxA, poor safety awareness during transfer. Completed sit>supine with modA for BLE management and trunk support. Remained supine in bed with bed alarm on and all needs in reach. RN notified at end of session.  Discharge Criteria: Patient will be discharged from PT if patient refuses treatment 3 consecutive times without medical reason, if treatment goals not met, if there is a change in medical status, if patient makes no progress towards goals or if patient is discharged from hospital.  The above assessment, treatment plan, treatment alternatives and goals were discussed and mutually agreed upon: by patient  Alger Simons PT ,DPT 11/26/2020, 12:14 PM

## 2020-11-27 DIAGNOSIS — I63512 Cerebral infarction due to unspecified occlusion or stenosis of left middle cerebral artery: Secondary | ICD-10-CM | POA: Diagnosis not present

## 2020-11-27 LAB — BASIC METABOLIC PANEL
Anion gap: 7 (ref 5–15)
BUN: 20 mg/dL (ref 8–23)
CO2: 24 mmol/L (ref 22–32)
Calcium: 8.8 mg/dL — ABNORMAL LOW (ref 8.9–10.3)
Chloride: 107 mmol/L (ref 98–111)
Creatinine, Ser: 0.98 mg/dL (ref 0.44–1.00)
GFR, Estimated: 56 mL/min — ABNORMAL LOW (ref >60)
Glucose, Bld: 91 mg/dL (ref 70–99)
Potassium: 3.8 mmol/L (ref 3.5–5.1)
Sodium: 138 mmol/L (ref 135–145)

## 2020-11-27 MED ORDER — POTASSIUM CHLORIDE 20 MEQ PO PACK
20.0000 meq | PACK | Freq: Once | ORAL | Status: AC
  Administered 2020-11-27: 20 meq via ORAL
  Filled 2020-11-27: qty 1

## 2020-11-27 NOTE — Progress Notes (Signed)
PROGRESS NOTE   Subjective/Complaints: No complaints Smiling in bed, watching the news  ROS: +hearing loss, +bruising  Objective:   No results found. Recent Labs    11/25/20 0102 11/26/20 0514  WBC 8.5 7.7  HGB 11.0* 10.5*  HCT 32.7* 30.9*  PLT 275 244   Recent Labs    11/26/20 0514 11/27/20 0506  NA 137 138  K 3.5 3.8  CL 105 107  CO2 20* 24  GLUCOSE 85 91  BUN 23 20  CREATININE 1.07* 0.98  CALCIUM 8.9 8.8*    Intake/Output Summary (Last 24 hours) at 11/27/2020 1243 Last data filed at 11/27/2020 0754 Gross per 24 hour  Intake 410 ml  Output --  Net 410 ml         Physical Exam: Vital Signs Blood pressure (!) 123/56, pulse 68, temperature 97.9 F (36.6 C), resp. rate 18, height 5\' 9"  (1.753 m), weight 59.3 kg, SpO2 99 %. Gen: no distress, normal appearing HEENT: oral mucosa pink and moist, NCAT, hard of hearing Cardio: Reg rate Chest: normal effort, normal rate of breathing Abd: soft, non-distended Ext: no edema Psych: pleasant, normal affect Skin: bruising on both arms Neurological:     Comments: Patient is severely hard of hearing.  Patient is awake alert, eyes are open.  Globally aphasic however she did have some short intermittent meaningful  sentences. She did not follow commands. ModA gait within room   Assessment/Plan: 1. Functional deficits which require 3+ hours per day of interdisciplinary therapy in a comprehensive inpatient rehab setting. Physiatrist is providing close team supervision and 24 hour management of active medical problems listed below. Physiatrist and rehab team continue to assess barriers to discharge/monitor patient progress toward functional and medical goals  Care Tool:  Bathing    Body parts bathed by patient: Right arm, Left arm, Chest, Abdomen, Face, Right upper leg, Left upper leg   Body parts bathed by helper: Front perineal area, Buttocks, Right lower leg,  Left lower leg     Bathing assist Assist Level: 2 Helpers     Upper Body Dressing/Undressing Upper body dressing   What is the patient wearing?: Pull over shirt    Upper body assist Assist Level: Minimal Assistance - Patient > 75%    Lower Body Dressing/Undressing Lower body dressing      What is the patient wearing?: Pants, Incontinence brief     Lower body assist Assist for lower body dressing: 2 Helpers     Toileting Toileting    Toileting assist Assist for toileting: 2 Helpers     Transfers Chair/bed transfer  Transfers assist     Chair/bed transfer assist level: Maximal Assistance - Patient 25 - 49%     Locomotion Ambulation   Ambulation assist   Ambulation activity did not occur: Refused          Walk 10 feet activity   Assist  Walk 10 feet activity did not occur: Refused        Walk 50 feet activity   Assist Walk 50 feet with 2 turns activity did not occur: Refused         Walk 150 feet activity  Assist Walk 150 feet activity did not occur: Refused         Walk 10 feet on uneven surface  activity   Assist Walk 10 feet on uneven surfaces activity did not occur: Refused         Wheelchair     Assist Is the patient using a wheelchair?: Yes Type of Wheelchair: Manual    Wheelchair assist level: Dependent - Patient 0%      Wheelchair 50 feet with 2 turns activity    Assist        Assist Level: Dependent - Patient 0%   Wheelchair 150 feet activity     Assist      Assist Level: Dependent - Patient 0%   Blood pressure (!) 123/56, pulse 68, temperature 97.9 F (36.6 C), resp. rate 18, height 5\' 9"  (1.753 m), weight 59.3 kg, SpO2 99 %.  Medical Problem List and Plan: 1.  Aphasia secondary to left MCA patchy infarct/left M2 near occlusion status post revascularization with stenting             -patient may shower             -ELOS/Goals: 10-14 days S  -Continue CIR 2.  Impaired  mobility: -DVT/anticoagulation:  Mechanical: Sequential compression devices, below knee Bilateral lower extremities             -antiplatelet therapy: Continue Aspirin 81 mg daily and Brilinta 90 mg twice daily 3. Pain Management: Tylenol as needed 4. Mood: Provide emotional support             -antipsychotic agents: N/A 5. Neuropsych: This patient is not capable of making decisions on her own behalf. 6. Skin/Wound Care: Routine skin checks 7. Fluids/Electrolytes/Nutrition: Routine in and outs with follow-up chemistries 8.  Hyperlipidemia.  Continue Lipitor 9.  Hypothyroidism.  Continue Synthroid 10.  Hypertension.  Labile, continue Cozaar 100 mg daily, Toprol-XL 50 mg daily.  Monitor with increased mobility. Magnesium level reviewed and normal 11.  MASD buttocks and groin.  Zinc oxide as directed with routine skin checks. Ordered diflucan per pharmacy consult.  12. AKI: Normalized. Placed nursing order to encourage 6-8 glasses of water per day,     LOS: 2 days A FACE TO FACE EVALUATION WAS PERFORMED  Maria Dyer 11/27/2020, 12:43 PM

## 2020-11-27 NOTE — Progress Notes (Signed)
Occupational Therapy Session Note  Patient Details  Name: Maria Dyer MRN: [id number] Date of Birth: 03-18-1931  Today's Date: 11/27/2020 OT Individual Time: 1400-1445 OT Individual Time Calculation (min): 45 min    Short Term Goals: Week 1:  OT Short Term Goal 1 (Week 1): Pt will don LB clothing with max A OT Short Term Goal 2 (Week 1): Pt will complete sit > stand with mod A OT Short Term Goal 3 (Week 1): Pt will complete stand pivot with mod A  Skilled Therapeutic Interventions/Progress Updates:    Pt sitting in w/c with great nephew and nephews signifcant other present throughout session for support. Pt responding "I guess" and smiling when therapist asked pt verbally if she had any pain.  Therapist asked pt simple yes/no questions related to person (including "is your last name Hobdy?" And "are you left handed?") on dry erase board ensuring good lighting and large print provided.  Pt responding by shrugging her shoulders and and saying "I dont?.." and looking at nephew for the answer.  Therapist utilized Public affairs consultant with pictures to ask pt is she needed anything and pt responding "yeah", however not able to point at any single picture when instructed to.  Therapist pointed to bathroom and asked pt if she needed to use bathroom, and pt saying "ok" and nodding head.  Upon cueing pt to stand and remove footrests pt stating "oh, no no" and gesturing therapist to stop.    Transported pt to ortho gym to attempt transfer training however pt again responding with gestures to refuse mobility and then produced two sentences verbally in word salad form.  Returned to room and discussed at length with nephew regarding pt's interest and overall occupational profile.    Per nephew, pt loved doing word searches and crossword puzzles, and was never very interested in physical tasks or exercise. Nephew provided pt with word search and pt immediately self initiating one and correctly circling  words without any cues. Nephew also reports that she used a 3 wheel rollator and pointed out that it was in the corner of pts room.  Therapist encouraged pt to attempt sit to stand using this rollator however pt indicating she wanted to continue working on her word search and "not right now".  Call bell in reach, seat alarm on at end of session.  Therapy Documentation Precautions:  Precautions Precautions: Fall Precaution Comments: receptive > expressive aphasia, HoH Restrictions Weight Bearing Restrictions: No   Therapy/Group: Individual Therapy  Amie Critchley 11/27/2020, 3:22 PM

## 2020-11-27 NOTE — Progress Notes (Signed)
Inpatient Rehabilitation Care Coordinator Assessment and Plan Patient Details  Name: Maria Dyer MRN: [id number] Date of Birth: 1931-02-07  Today's Date: 11/27/2020  Hospital Problems: Principal Problem:   Left middle cerebral artery stroke [facility])  Past Medical History:  Past Medical History:  Diagnosis Date   Hypertension    Past Surgical History:  Past Surgical History:  Procedure Laterality Date   IR CT HEAD LTD  11/19/2020   IR INTRA CRAN STENT  11/19/2020   IR PERCUTANEOUS ART THROMBECTOMY/INFUSION INTRACRANIAL INC DIAG ANGIO  11/19/2020   IR US GUIDE VASC ACCESS LEFT  11/19/2020   RADIOLOGY WITH ANESTHESIA N/A 11/19/2020   Procedure: IR WITH ANESTHESIA;  Surgeon: Radiologist, Medication, MD;  Location: [facility] OR;  Service: Radiology;  Laterality: N/A;   Social History:  reports that she has never smoked. She has never used smokeless tobacco. No history on file for alcohol use and drug use.  Family / Support Systems Marital Status: Single Patient Roles: Other (Comment) (Aunt) Other Supports: nancy-niece [phone]  Danielle-great niecs [phone]  Rosanne Ashing- great nephew [phone] Anticipated Caregiver: Duwayne Heck and hired assist/ALF Ability/Limitations of Caregiver: Niece and great nephew are flight attendants, great niece is local and was assisting with appointments and gorcery shopping. Looking into ALF-pt unaware of this Caregiver Availability: Other (Comment) (Aware will require 24/7 care) Family Dynamics: Close with niece and great niece and nephew, all are involved and see pt every other day. Pt has never married nor had children so they are her family.  Social History Preferred language: English Religion:  Cultural Background: No issues Education: HS few college courses Health Literacy - How often do you need to have someone help you when you read instructions, pamphlets, or other written material from your doctor or pharmacy?: Never Writes: Yes Employment Status: Retired Market researcher Issues: No issues Guardian/Conservator: None-according to MD pt is  ot fully capable of making her own decisions while here, since only family is niece will look toward her if any decisions need to be made while here   Abuse/Neglect Abuse/Neglect Assessment Can Be Completed: Yes Physical Abuse: Denies Verbal Abuse: Denies Sexual Abuse: Denies Exploitation of patient/patient's resources: Denies Self-Neglect: Denies  Patient response to: Social Isolation - How often do you feel lonely or isolated from those around you?: Patient unable to respond  Emotional Status Pt's affect, behavior and adjustment status: Pt is motivated to do well, she has always been independent according to family. She did use a rw in the home and wc in the community. She has a fear of falling since fell a few times at home prior to admission-according to [facility] Recent Psychosocial Issues: other health issues-recent falls HOH makes difficult to communicate with along with her aphasia Psychiatric History: No history deferred depression screening due to aphasia will continue to assess this Substance Abuse History: No issues  Patient / Family Perceptions, Expectations & Goals Pt/Family understanding of illness & functional limitations: Duwayne Heck feels she has some understanding she has had a stroke, she has had other family members have them and could not walk when happened. She and her Mom have spoken with the MD and feel they have a basic understanding going forward and are hopfeul she will make progress here on rehab. Premorbid pt/family roles/activities: aunt, great aunt, retiree, neighbor, etc Anticipated changes in roles/activities/participation: resume Pt/family expectations/goals: Duwayne Heck states: " We are hopeful she can make enough progress to go to an Assisted Living facility we know she can't stay alone anymore."  [employer]  Resources [employer]: None Premorbid Home Care/DME  Agencies: Other (Comment) (has rw and wc) Transportation available at discharge: Family provided for years Is the patient able to respond to transportation needs?: Other (comment) (yes/no)  Discharge Planning Living Arrangements: Alone Support Systems: Other relatives, Friends/neighbors Type of Residence: Private residence Insurance Resources: Media planner (specify) Radiographer, therapeutic) Financial Resources: Social Security Financial Screen Referred: No Living Expenses: Own Money Management: Patient Does the patient have any problems obtaining your medications?: No Home Management: Self and great niece Patient/Family Preliminary Plans: Family plans have not told pt yet to have her go to ALF if at the level she can be managed there. Made aware due to managed care medicare she can not go to a SNF and have coverage from here. She is aware of this and voiced pt has the financial means to provide for herself and her care needs. Will await progress and will know better next week at team conference of pt's care needs for discharge. Care Coordinator Barriers to Discharge: Decreased caregiver support, Insurance for SNF coverage, Other (comments) Care Coordinator Anticipated Follow Up Needs: SNF, ALF/IL  Clinical Impression Pleasant elderly female who is extremely HOH and has aphasia from her stroke. Her niece, great niece and nephew are very involved and were assisting prior to admission with appointments and home management. They are aware she can not be home alone now and are working on options-one is ALF. Will need to see if at the level where they can manage her and will want to come in and evaluate pt prior to discharging. Will work with family on best option for pt and safe discharge plan. Aware SNF would not be covered from here-due to managed medicare plan.  Lucy Chris 11/27/2020, 10:36 AM

## 2020-11-27 NOTE — Progress Notes (Addendum)
Physical Therapy Session Note  Patient Details  Name: Maria Dyer MRN: [id number] Date of Birth: Jul 06, 1930  Today's Date: 11/27/2020 PT Individual Time: 0835-0930 PT Individual Time Calculation (min): 55 min   Short Term Goals: Week 1:  PT Short Term Goal 1 (Week 1): Pt will complete supine<>sit with minA PT Short Term Goal 2 (Week 1): Pt will complete sit<>stand transfers with minA PT Short Term Goal 3 (Week 1): Pt will complete bed<>chair transfers with modA PT Short Term Goal 4 (Week 1): Pt will initiate gait training  Skilled Therapeutic Interventions/Progress Updates:    Patient in supine and requesting to watch TV.  Encouraged to participate in therapy since she was on rehab.  Initially indicating with communication board needing to use the bathroom.  Brought out BSC, then pt reported she did not need to toilet.  Agreeable to brush teeth after communicating with communication board. Patient supine to sit with min A and pivot to w/c mod A with mod/max A for scooting back in chair.  Patient seated and brushed teeth and combed hair with set up. Seated to don shorts pt needing mod to max A to thread legs and sit to partial stand at sink mod to max A and total A to pull up shorts.  Assisted in w/c to therapy gym.  Attempted multiple times sit to stand to RW, then in parallel bars all the while explaining reasons for needing to try and that we would help her, etc.  Patient even with +2 A in parallel bars did not help to stand.  Patient seated for attempts at anterior weight shift to bounce ball on rebounder, pt tossed, but unable to catch so assist to catch and pt would toss again with limited anterior weight shift.  Patient at [facility] performed 2 x 2 minutes with tapping circles with A for R UE mostly center field with encouragement.  Patient seated for LAQ x 10 with 1.5# weights and cues.  Assisted pt to room.  Attempted up to [facility] to get to recliner, but unable to get pt to stand with max to  total A.  Left in w/c with alarm belt active and call bell in reach, NT aware.  Therapy Documentation Precautions:  Precautions Precautions: Fall Precaution Comments: receptive > expressive aphasia, HoH Restrictions Weight Bearing Restrictions: No  Pain: Pain Assessment Pain Scale: 0-10 Pain Score: 0-No pain Faces Pain Scale: No hurt    Therapy/Group: Individual Therapy  Elray Mcgregor Bigelow, PT 11/27/2020, 9:24 AM

## 2020-11-27 NOTE — Progress Notes (Signed)
Inpatient Rehabilitation Center Individual Statement of Services  Patient Name:  Maria Dyer  Date:  11/27/2020  Welcome to the Inpatient Rehabilitation Center.  Our goal is to provide you with an individualized program based on your diagnosis and situation, designed to meet your specific needs.  With this comprehensive rehabilitation program, you will be expected to participate in at least 3 hours of rehabilitation therapies Monday-Friday, with modified therapy programming on the weekends.  Your rehabilitation program will include the following services:  Physical Therapy (PT), Occupational Therapy (OT), Speech Therapy (ST), 24 hour per day rehabilitation nursing, Care Coordinator, Rehabilitation Medicine, Nutrition Services, and Pharmacy Services  Weekly team conferences will be held on Tuesday to discuss your progress.  Your Inpatient Rehabilitation Care Coordinator will talk with you frequently to get your input and to update you on team discussions.  Team conferences with you and your family in attendance may also be held.  Expected length of stay: 2-3 weeks  Overall anticipated outcome: min assist level  Depending on your progress and recovery, your program may change. Your Inpatient Rehabilitation Care Coordinator will coordinate services and will keep you informed of any changes. Your Inpatient Rehabilitation Care Coordinator's name and contact numbers are listed  below.  The following services may also be recommended but are not provided by the Inpatient Rehabilitation Center:   Home Health Rehabiltiation Services Outpatient Rehabilitation Services    Arrangements will be made to provide these services after discharge if needed.  Arrangements include referral to agencies that provide these services.  Your insurance has been verified to be:  [employer] Your primary doctor is:  Community education officer  Pertinent information will be shared with your doctor and your insurance  company.  Inpatient Rehabilitation Care Coordinator:  Dossie Der, [place] [phone] or ([phone]  Information discussed with and copy given to patient by: Lucy Chris, 11/27/2020, 10:20 AM

## 2020-11-27 NOTE — Progress Notes (Signed)
Occupational Therapy Session Note  Patient Details  Name: Maria Dyer MRN: [id number] Date of Birth: 1930/04/21  Today's Date: 11/27/2020 OT Individual Time: 6646-6056 OT Individual Time Calculation (min): 24 min    Short Term Goals: Week 1:  OT Short Term Goal 1 (Week 1): Pt will don LB clothing with max A OT Short Term Goal 2 (Week 1): Pt will complete sit > stand with mod A OT Short Term Goal 3 (Week 1): Pt will complete stand pivot with mod A  Skilled Therapeutic Interventions/Progress Updates:    Pt received seated in w/c, no s/sx pain throughout session, agreeable to therapy. Session focus on self-care retraining, activity tolerance, func cognition, func transfers in prep for improved ADL/IADL/func mobility performance + decreased caregiver burden. Total A w/c transport to and from kitchen. Seated at kitchen table, presented with mixed utensils and Microbiologist. After min demonstrational cuing, pt able to organize drawer with 100% accuracy using BUE. Presented with mixed basket of fake fruit and instructed to sort by color. Pt able to verbalize fruit names, but required total A to sort by color. Offered to return to bed, pt initially agreeable. Powered up with mod A, although difficulty with achieving upright posture. Adamantly declining transfer to bed stating "it's too far" "I can't do it." Presented with stedy and pt cont to decline, stating "here is fine."   Pt left seated in w/c with safety belt alarm engaged, call bell in reach, and all immediate needs met.    Therapy Documentation Precautions:  Precautions Precautions: Fall Precaution Comments: receptive > expressive aphasia, HoH Restrictions Weight Bearing Restrictions: No  Pain:  No s/sx throughout ADL: See Care Tool for more details.  Therapy/Group: Individual Therapy  Volanda Napoleon MS, OTR/L  11/27/2020, 6:50 AM

## 2020-11-27 NOTE — Progress Notes (Signed)
Speech Language Pathology Daily Session Note  Patient Details  Name: Maria Dyer MRN: [id number] Date of Birth: 02/07/1931  Today's Date: 11/27/2020 SLP Individual Time: 1000-1100 SLP Individual Time Calculation (min): 60 min  Short Term Goals: Week 1: SLP Short Term Goal 1 (Week 1): Pt will respond to yes/no questions with 70% accuracy with max A written and visual cues. SLP Short Term Goal 2 (Week 1): Pt follow identify common objects/pictures in a field of 2 with 70% accuracy with mod A written cues. SLP Short Term Goal 3 (Week 1): Pt will name common objects with 80% accuracy with mod A written/sentence completion cues. SLP Short Term Goal 4 (Week 1): Pt will demonstrate self-monitoring and self-correction of verbal errors in 70% of opportunities with max A verbal cues. SLP Short Term Goal 5 (Week 1): Pt will write at word/ 2 word phrase level with 70% accuracy with mod A verbal cues. SLP Short Term Goal 6 (Week 1): Pt will demonstrate reading comprehension at 2 word phrase level by matching phrase to picture in a field 2 with 80% accuracy with mod A verbal cues.  Skilled Therapeutic Interventions:   Patient seen for skilled ST session focusing on aphasia goals. SLP wore clear mask and wrote down most questions/statements secondary to patient's hearing impairment.  Patient named 8 of 10 object photos, but only able to name one verb/action photo. She responded to open ended biographical questions when SLP wrote on dry erase board, initially requiring two choice written word, but improving to be able to respond to some questions without choices. She pointed to hospital name from choice of two correctly. She was not able to answer question "Why are you here in the hospital?" And denied that it was from a stroke, despite SLP rephrasing information. Patient was able to spontaneously produce some phrases to comment and request but with semantic and word errors. "Someone cooks and cleans for me",  "Can you get one of those.....[employer]?" With moderate amount of cues, patient able to indicate she was from [place] and city was [place], television[place]film set'. Patient continues to benefit from skilled SLP intervention to maximize language abilities prior to discharge.  Pain Pain Assessment Pain Scale: Faces Faces Pain Scale: No hurt  Therapy/Group: Individual Therapy  Angela Nevin, MA, CCC-SLP Speech Therapy

## 2020-11-28 DIAGNOSIS — I63512 Cerebral infarction due to unspecified occlusion or stenosis of left middle cerebral artery: Secondary | ICD-10-CM | POA: Diagnosis not present

## 2020-11-28 NOTE — Progress Notes (Addendum)
Speech Language Pathology Daily Session Note  Patient Details  Name: Maria Dyer MRN: [id number] Date of Birth: 1930/05/23  Today's Date: 11/28/2020 SLP Individual Time: 0800-0900 SLP Individual Time Calculation (min): 60 min  Short Term Goals: Week 1: SLP Short Term Goal 1 (Week 1): Pt will respond to yes/no questions with 70% accuracy with max A written and visual cues. SLP Short Term Goal 2 (Week 1): Pt follow identify common objects/pictures in a field of 2 with 70% accuracy with mod A written cues. SLP Short Term Goal 3 (Week 1): Pt will name common objects with 80% accuracy with mod A written/sentence completion cues. SLP Short Term Goal 4 (Week 1): Pt will demonstrate self-monitoring and self-correction of verbal errors in 70% of opportunities with max A verbal cues. SLP Short Term Goal 5 (Week 1): Pt will write at word/ 2 word phrase level with 70% accuracy with mod A verbal cues. SLP Short Term Goal 6 (Week 1): Pt will demonstrate reading comprehension at 2 word phrase level by matching phrase to picture in a field 2 with 80% accuracy with mod A verbal cues.  Skilled Therapeutic Interventions:   Patient seen for skilled ST session focusing on cognitive linguistic goals. Patient in bed, awake and alert and agreeable to therapy. She asked SLP twice during session what day it was but after SLP wrote down day of week, month, year she was able to utilize this to answer time orientation questions printed on dry erase board. Patient named 85% of object photos but was not able to name any verb/action photos. She followed command to point to nose and point to her glasses (on the table). She was not able to point to window without mod-maxA cues. She correctly answered what her current age was but not any WH (What, where) questions about herself, immediate environment or family). SLP used exaggerated gestures and then had to rephrase questions into statements. She continues to deny being in  hospital or having had a stroke and is not able to state any impairments or why she is here. She is severely expressively aphasic and moderately receptively aphasic but SLP is also questioning an underlying dementia. (Will need to talk with family). Patient continues to benefit from skilled SLP intervention to maximize cognitive-linguistic function prior to discharge.  Pain Pain Assessment Pain Scale: 0-10 Pain Score: 0-No pain Faces Pain Scale: No hurt  Therapy/Group: Individual Therapy  Angela Nevin, MA, CCC-SLP Speech Therapy

## 2020-11-28 NOTE — Progress Notes (Signed)
Occupational Therapy Session Note  Patient Details  Name: Maria Dyer MRN: [id number] Date of Birth: Jan 28, 1931  Today's Date: 11/28/2020 OT Individual Time: 1300-1400 OT Individual Time Calculation (min): 60 min    Short Term Goals: Week 1:  OT Short Term Goal 1 (Week 1): Pt will don LB clothing with max A OT Short Term Goal 2 (Week 1): Pt will complete sit > stand with mod A OT Short Term Goal 3 (Week 1): Pt will complete stand pivot with mod A  Skilled Therapeutic Interventions/Progress Updates:    Pt sitting up in w/c with nephews in room.  Nephew reports she has been more interactive and verbal with them today. Pt refusing bathing initially but with encouragement she was willing to change clothes.  Pt doffed and donned shirt with setup sitting at sink.  Pt very hesitant to stand up saying "no, no, I cant, I cant".  With encouragment pt stood with min assist however clenching with both hands at sink and resistive to standing upright.  Pt was able to maintain static standing with downward gaze with CGA.  Encouraged pt to reach and pull pants over hips but pt just repeating "no, no, I cant" and resistive to tactile cues for unilateral release, therefore total assist to pull pants down over hips.  Pt was able to thread pant legs over feet with supervision.  Also doffed shoes with supervision.  Donned shorts with same level assist as doffing due to fear of falling.  OT noted pt with soiled brief of stool therfore transported pt to bathroom.  Pt initially refusing stand pivot to toilet stating "I cant" but with encouragement pt completed toilet transfer with heavy use of grab bars needing min assist.  Pt had continent episode of bowel with loose stool.  Pericare completed with forward lean seated needing supervision due to pt attempting to wipe with hand instead of washcloth.  Pt completed sit<>stand with min assist and a lot of encouragement and total assist for clothing mgt.  Stand pivot with min  assist attempting to use pts personal rollator, however pt clenching to grab bar and unwilling to release while pivoting to grasp rollator.    Pt completed 2x10 reps of shoulder forward flexion and biceps curls bilaterally with 1.5lb wrist weights applied to encourage UB strength and endurance for increased independence during functional trasnfers.  Call bell in reach, seat belt alarm on.  Therapy Documentation Precautions:  Precautions Precautions: Fall Precaution Comments: receptive > expressive aphasia, HoH Restrictions Weight Bearing Restrictions: No    Therapy/Group: Individual Therapy  Amie Critchley 11/28/2020, 12:48 PM

## 2020-11-28 NOTE — IPOC Note (Signed)
Overall Plan of Care [facility]) Patient Details Name: Maria Dyer MRN: [id number] DOB: 1930-12-29  Admitting Diagnosis: Left middle cerebral artery stroke [facility])  Hospital Problems: Principal Problem:   Left middle cerebral artery stroke [facility])     Functional Problem List: Nursing Bladder, Bowel, Nutrition, Medication Management, Pain, Endurance, Safety  PT Balance, Behavior, Endurance, Motor, Safety, Skin Integrity  OT Balance, Perception, Cognition, Safety, Skin Integrity, Endurance, Motor, Pain  SLP Cognition  TR         Basic ADL's: OT Bathing, Dressing, Toileting, Grooming     Advanced  ADL's: OT       Transfers: PT Bed Mobility, Bed to Chair, Car  OT Toilet     Locomotion: PT Ambulation, Stairs     Additional Impairments: OT None  SLP Communication, Social Cognition comprehension, expression Awareness, Problem Solving, Attention  TR      Anticipated Outcomes Item Anticipated Outcome  Self Feeding no goal set  Swallowing      Basic self-care  min A  Toileting  min A   Bathroom Transfers min A  Bowel/Bladder  manage with mod I assit  Transfers  minA  Locomotion  minA  Communication  Supervision-Min A  Cognition  Min A emergent awareness  Pain  at or below level 4  Safety/Judgment  maintain with cues/supervision   Therapy Plan: PT Intensity: Minimum of 1-2 x/day ,45 to 90 minutes PT Frequency: 5 out of 7 days PT Duration Estimated Length of Stay: 2-3 weeks OT Intensity: Minimum of 1-2 x/day, 45 to 90 minutes OT Frequency: 5 out of 7 days OT Duration/Estimated Length of Stay: 3 weeks SLP Intensity: Minumum of 1-2 x/day, 30 to 90 minutes SLP Frequency: 3 to 5 out of 7 days SLP Duration/Estimated Length of Stay: 21-24 days   Due to the current state of emergency, patients may not be receiving their 3-hours of Medicare-mandated therapy.   Team Interventions: Nursing Interventions Bladder Management, Disease Management/Prevention, Medication  Management, Discharge Planning, Pain Management, Bowel Management, Patient/Family Education, Skin Care/Wound Management  PT interventions Ambulation/gait training, Discharge planning, Functional mobility training, Psychosocial support, Therapeutic Activities, Visual/perceptual remediation/compensation, Wheelchair propulsion/positioning, Therapeutic Exercise, Skin care/wound management, Neuromuscular re-education, Disease management/prevention, Warden/ranger, Cognitive remediation/compensation, DME/adaptive equipment instruction, Pain management, Splinting/orthotics, UE/LE Strength taining/ROM, UE/LE Coordination activities, Patient/family education, Stair training, Community reintegration  OT Interventions Warden/ranger, Discharge planning, Pain management, Self Care/advanced ADL retraining, UE/LE Coordination activities, Therapeutic Activities, Cognitive remediation/compensation, Functional mobility training, Patient/family education, Skin care/wound managment, Therapeutic Exercise, UE/LE Strength taining/ROM, DME/adaptive equipment instruction, Neuromuscular re-education, Wheelchair propulsion/positioning, Psychosocial support  SLP Interventions Cognitive remediation/compensation, Cueing hierarchy, Functional tasks, Internal/external aids, Multimodal communication approach, Patient/family education, Speech/Language facilitation  TR Interventions    SW/CM Interventions Discharge Planning, Psychosocial Support, Patient/Family Education   Barriers to Discharge MD  Medical stability  Nursing Home environment access/layout, Incontinence, Lack of/limited family support 1 level solo, neice and nephew check in, RW and w/c  PT Decreased caregiver support, Home environment access/layout, Incontinence, Lack of/limited family support, Insurance for SNF coverage, Behavior significant aphasia  OT Decreased caregiver support family unable to provide care needed, will d/c to assistive  living  SLP      SW Decreased caregiver support, Community education officer for SNF coverage, Other (comments)     Team Discharge Planning: Destination: PT-Home ,OT- Assisted Living , SLP-Home (with supervision A) Projected Follow-up: PT-Home health PT, Skilled nursing facility, Other (comment), 24 hour supervision/assistance (if 24/7 assist is unavailable, will likely require SNF), OT-  Home health  OT, SLP-Home Health SLP, Outpatient SLP, 24 hour supervision/assistance Projected Equipment Needs: PT-To be determined, OT- To be determined, SLP-None recommended by SLP Equipment Details: PT- , OT-  Patient/family involved in discharge planning: PT- Patient,  OT-Family member/caregiver, SLP-Patient  MD ELOS: 2-3 weeks Medical Rehab Prognosis:  Excellent Assessment: Maria Dyer is an 87 year old woman admitted to CIR with left MCA stroke. Course complicated by aphasia and cognitive deficits. Medications are being managed, and labs and vitals are being monitored regularly.      See Team Conference Notes for weekly updates to the plan of care

## 2020-11-28 NOTE — Progress Notes (Signed)
PROGRESS NOTE   Subjective/Complaints: No complaints Asks for TV to be turned on Participated with SLP- cognitive deficits noted  ROS: +hearing loss, +bruising, +impaired cognition  Objective:   No results found. Recent Labs    11/26/20 0514  WBC 7.7  HGB 10.5*  HCT 30.9*  PLT 244   Recent Labs    11/26/20 0514 11/27/20 0506  NA 137 138  K 3.5 3.8  CL 105 107  CO2 20* 24  GLUCOSE 85 91  BUN 23 20  CREATININE 1.07* 0.98  CALCIUM 8.9 8.8*    Intake/Output Summary (Last 24 hours) at 11/28/2020 1310 Last data filed at 11/28/2020 7096 Gross per 24 hour  Intake 280 ml  Output --  Net 280 ml         Physical Exam: Vital Signs Blood pressure (!) 167/61, pulse 72, temperature 98 F (36.7 C), resp. rate 18, height 5\' 9"  (1.753 m), weight 59.3 kg, SpO2 100 %. Gen: no distress, normal appearing HEENT: oral mucosa pink and moist, NCAT, hard of hearing Cardio: Reg rate Chest: normal effort, normal rate of breathing Abd: soft, non-distended Ext: no edema Psych: pleasant, normal affect, smiling Skin: bruising on both arms Neurological:     Comments: Patient is severely hard of hearing.  Patient is awake alert, eyes are open.  Globally aphasic however she did have some short intermittent meaningful  sentences. She did not follow commands. ModA gait within room. Impaired cognition   Assessment/Plan: 1. Functional deficits which require 3+ hours per day of interdisciplinary therapy in a comprehensive inpatient rehab setting. Physiatrist is providing close team supervision and 24 hour management of active medical problems listed below. Physiatrist and rehab team continue to assess barriers to discharge/monitor patient progress toward functional and medical goals  Care Tool:  Bathing    Body parts bathed by patient: Right arm, Left arm, Chest, Abdomen, Face, Right upper leg, Left upper leg   Body parts bathed by  helper: Front perineal area, Buttocks, Right lower leg, Left lower leg     Bathing assist Assist Level: 2 Helpers     Upper Body Dressing/Undressing Upper body dressing   What is the patient wearing?: Pull over shirt    Upper body assist Assist Level: Minimal Assistance - Patient > 75%    Lower Body Dressing/Undressing Lower body dressing      What is the patient wearing?: Pants, Incontinence brief     Lower body assist Assist for lower body dressing: Maximal Assistance - Patient 25 - 49%     Toileting Toileting    Toileting assist Assist for toileting: 2 Helpers     Transfers Chair/bed transfer  Transfers assist     Chair/bed transfer assist level: Maximal Assistance - Patient 25 - 49% (scoot pivot)     Locomotion Ambulation   Ambulation assist   Ambulation activity did not occur: Refused          Walk 10 feet activity   Assist  Walk 10 feet activity did not occur: Refused        Walk 50 feet activity   Assist Walk 50 feet with 2 turns activity did not occur: Refused  Walk 150 feet activity   Assist Walk 150 feet activity did not occur: Refused         Walk 10 feet on uneven surface  activity   Assist Walk 10 feet on uneven surfaces activity did not occur: Refused         Wheelchair     Assist Is the patient using a wheelchair?: Yes Type of Wheelchair: Manual    Wheelchair assist level: Dependent - Patient 0%      Wheelchair 50 feet with 2 turns activity    Assist        Assist Level: Dependent - Patient 0%   Wheelchair 150 feet activity     Assist      Assist Level: Dependent - Patient 0%   Blood pressure (!) 167/61, pulse 72, temperature 98 F (36.7 C), resp. rate 18, height 5\' 9"  (1.753 m), weight 59.3 kg, SpO2 100 %.  Medical Problem List and Plan: 1.  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  Impaired mobility: -DVT/anticoagulation:  Mechanical: Sequential compression devices, below knee Bilateral lower extremities             -antiplatelet therapy: Continue Aspirin 81 mg daily and Brilinta 90 mg twice daily 3. Pain Management: Tylenol as needed 4. Mood: Provide emotional support             -antipsychotic agents: N/A 5. Neuropsych: This patient is not capable of making decisions on her own behalf. 6. Skin/Wound Care: Routine skin checks 7. Fluids/Electrolytes/Nutrition: Routine in and outs with follow-up chemistries 8.  Hyperlipidemia.  Continue Lipitor 9.  Hypothyroidism.  Continue Synthroid 10.  Hypertension.  Labile, continue Cozaar 100 mg daily, Toprol-XL 50 mg daily.  Monitor with increased mobility. Magnesium level reviewed and normal 11.  MASD buttocks and groin.  Zinc oxide as directed with routine skin checks. Continue diflucan per pharmacy consult.  12. AKI: Normalized. Placed nursing order to encourage 6-8 glasses of water per day. 13. Hard of hearing: Try to wear see through masks when communicating with her    LOS: 3 days A FACE TO FACE EVALUATION WAS PERFORMED  Yatzari Jonsson P Parneet Glantz 11/28/2020, 1:10 PM

## 2020-11-28 NOTE — Progress Notes (Signed)
Physical Therapy Session Note  Patient Details  Name: Maria Dyer MRN: [id number] Date of Birth: 1931/01/13  Today's Date: 11/28/2020 PT Individual Time: 1005-1100 PT Individual Time Calculation (min): 55 min   Short Term Goals: Week 1:  PT Short Term Goal 1 (Week 1): Pt will complete supine<>sit with minA PT Short Term Goal 2 (Week 1): Pt will complete sit<>stand transfers with minA PT Short Term Goal 3 (Week 1): Pt will complete bed<>chair transfers with modA PT Short Term Goal 4 (Week 1): Pt will initiate gait training  Skilled Therapeutic Interventions/Progress Updates:    Pt received supine in bed and appears agreeable to therapy session. Pt able to communicate some, short more automatic phrases during session - responds well to gestures. Supine>sitting L EOB, HOB partially elevated and using bedrails with light min assist for trunk upright and pivoting hips - requires increased gesturing to understand task request. Sitting EOB with supervision for trunk control using B UE support as needed, while threading shorts and donning shoes total assist for time management. Sit>stand EOB>RW with mod assist 4x while pulling up pants over hips total assist - pt able to lift hips up off of bed but then unable to extend further to come to full stand stating "I can't" and appearing fearful and quickly returning to sit therefore requiring multiple attempts at coming to stand in order to get shorts up over hips. R squat pivot EOB>w/c with max assist for lifting/pivoting hips - again pt does not feel comfortable coming to stand but rather performs squat/scoot lifting hips off briefly prior to returning to sit - cuing and facilitation for anterior trunk lean.    Transported to/from gym in w/c for time management and energy conservation. Therapist provided patient with different wheelchair that has lower floor-to-seat height to improve pt's ability to complete transfers safely. Squat/scoot pivot transfer  w/c>EOM>w/c with max assist as described above.   Sitting EOM during session to decrease trunk support while participating in anterior reaching tasks to improve pt's comfort with leaning forward then progressing to partial stand lifting hips up off of mat while matching cards or placing objects on mirror. Pt would repeatedly state "I can't" requiring repeated encouragement to attempt - does well when having a cognitive task to focus on because then leans forward more and attempts coming to stand; however, needs B UE support to improve ability to try to come to stand.  In // bars performed attempted sit>stands 3x with max assist of 1 (delayed assistance to decrease pt's fear of falling by allowing her increased time) and on 3rd attempt was able to come to more upright stance with increased hip extension; however, pt very fearful and yelling out "I can't." Therapist provided breathing and relaxation strategies and educated pt on having just stood but pt still states "I can't." Transported back to room with family present - educated them on therapeutic tasks performed and pt's fear of falling limiting progression to standing. Pt left seated in w/c with needs in reach, seat belt alarm on, and family present.  Therapy Documentation Precautions:  Precautions Precautions: Fall Precaution Comments: receptive > expressive aphasia, HoH Restrictions Weight Bearing Restrictions: No   Pain:  No indication of pain during session.   Therapy/Group: Individual Therapy  Ginny Forth , PT, DPT, NCS, CSRS 11/28/2020, 7:52 AM

## 2020-11-29 MED ORDER — AMLODIPINE BESYLATE 2.5 MG PO TABS
2.5000 mg | ORAL_TABLET | Freq: Every day | ORAL | Status: DC
  Administered 2020-11-29 – 2020-12-01 (×3): 2.5 mg via ORAL
  Filled 2020-11-29 (×3): qty 1

## 2020-11-29 NOTE — Progress Notes (Signed)
PROGRESS NOTE   Subjective/Complaints: No complaints this morning Watching TV, eating her breakfast Tolerating therapy Denies pain  ROS: +hearing loss, +bruising, +impaired cognition, denies pain  Objective:   No results found. No results for input(s): WBC, HGB, HCT, PLT in the last 72 hours.  Recent Labs    11/27/20 0506  NA 138  K 3.8  CL 107  CO2 24  GLUCOSE 91  BUN 20  CREATININE 0.98  CALCIUM 8.8*    Intake/Output Summary (Last 24 hours) at 11/29/2020 1327 Last data filed at 11/29/2020 1325 Gross per 24 hour  Intake 1000 ml  Output --  Net 1000 ml         Physical Exam: Vital Signs Blood pressure (!) 159/51, pulse 71, temperature 97.9 F (36.6 C), resp. rate 17, height 5\' 9"  (1.753 m), weight 59.3 kg, SpO2 100 %. Gen: no distress, normal appearing HEENT: oral mucosa pink and moist, NCAT, hard of hearing Cardio: Reg rate Chest: normal effort, normal rate of breathing Abd: soft, non-distended Ext: no edema Psych: pleasant, normal affect, smiling Skin: bruising on both arms Neurological/MSK    Comments: Patient is severely hard of hearing.  Patient is awake alert, eyes are open.  Globally aphasic however she did have some short intermittent meaningful  sentences. She did not follow commands. ModA gait within room. Impaired cognition Supine to sit MinA   Assessment/Plan: 1. Functional deficits which require 3+ hours per day of interdisciplinary therapy in a comprehensive inpatient rehab setting. Physiatrist is providing close team supervision and 24 hour management of active medical problems listed below. Physiatrist and rehab team continue to assess barriers to discharge/monitor patient progress toward functional and medical goals  Care Tool:  Bathing    Body parts bathed by patient: Right arm, Left arm, Chest, Abdomen, Face, Right upper leg, Left upper leg   Body parts bathed by helper: Front  perineal area, Buttocks, Right lower leg, Left lower leg     Bathing assist Assist Level: 2 Helpers     Upper Body Dressing/Undressing Upper body dressing   What is the patient wearing?: Pull over shirt    Upper body assist Assist Level: Minimal Assistance - Patient > 75%    Lower Body Dressing/Undressing Lower body dressing      What is the patient wearing?: Pants, Incontinence brief     Lower body assist Assist for lower body dressing: Maximal Assistance - Patient 25 - 49%     Toileting Toileting    Toileting assist Assist for toileting: 2 Helpers     Transfers Chair/bed transfer  Transfers assist     Chair/bed transfer assist level: Maximal Assistance - Patient 25 - 49% (scoot pivot)     Locomotion Ambulation   Ambulation assist   Ambulation activity did not occur: Refused          Walk 10 feet activity   Assist  Walk 10 feet activity did not occur: Refused        Walk 50 feet activity   Assist Walk 50 feet with 2 turns activity did not occur: Refused         Walk 150 feet activity   Assist  Walk 150 feet activity did not occur: Refused         Walk 10 feet on uneven surface  activity   Assist Walk 10 feet on uneven surfaces activity did not occur: Refused         Wheelchair     Assist Is the patient using a wheelchair?: Yes Type of Wheelchair: Manual    Wheelchair assist level: Dependent - Patient 0%      Wheelchair 50 feet with 2 turns activity    Assist        Assist Level: Dependent - Patient 0%   Wheelchair 150 feet activity     Assist      Assist Level: Dependent - Patient 0%   Blood pressure (!) 159/51, pulse 71, temperature 97.9 F (36.6 C), resp. rate 17, height 5\' 9"  (1.753 m), weight 59.3 kg, SpO2 100 %.  Medical Problem List and Plan: 1.  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  Impaired mobility: -DVT/anticoagulation:  Mechanical: Sequential compression devices, below knee Bilateral lower extremities             -antiplatelet therapy: Continue Aspirin 81 mg daily and Brilinta 90 mg twice daily 3. Pain Management: N/A 4. Mood: Provide emotional support             -antipsychotic agents: N/A 5. Neuropsych: This patient is not capable of making decisions on her own behalf. 6. Skin/Wound Care: Routine skin checks 7. Fluids/Electrolytes/Nutrition: Routine in and outs with follow-up chemistries 8.  Hyperlipidemia.  Continue Lipitor 9.  Hypothyroidism.  Continue Synthroid 10.  Hypertension.  Labile, continue Cozaar 100 mg daily, Toprol-XL 50 mg daily.  Add amlodipine 2.5mg . Monitor with increased mobility. Magnesium level reviewed and normal 11.  MASD buttocks and groin.  Zinc oxide as directed with routine skin checks. continue diflucan per pharmacy consult.  12. AKI: Normalized. Placed nursing order to encourage 6-8 glasses of water per day. 13. Hard of hearing: Try to wear see through masks when communicating with her    LOS: 4 days A FACE TO FACE EVALUATION WAS PERFORMED  P Ravon Mcilhenny 11/29/2020, 1:27 PM

## 2020-11-29 NOTE — Progress Notes (Signed)
Occupational Therapy Session Note  Patient Details  Name: Maria Dyer MRN: [id number] Date of Birth: 1931/02/24  Today's Date: 11/29/2020 OT Individual Time: 0930-1030 OT Individual Time Calculation (min): 60 min    Short Term Goals: Week 1:  OT Short Term Goal 1 (Week 1): Pt will don LB clothing with max A OT Short Term Goal 2 (Week 1): Pt will complete sit > stand with mod A OT Short Term Goal 3 (Week 1): Pt will complete stand pivot with mod A   Skilled Therapeutic Interventions/Progress Updates:    Pt semi reclined in bed, smiling and no signs of pain throughout session.  Pt initially politely refusing self care, however with gentle encouragement pt agreeable to changing and bathing at sinkside.  Supine to sit with min assist using bed rail.  Pt fearful and resistant to stand pivot to w/c initially therefore therapist positioned self in front of pt sitting in chair and provided min assist to encourage anterior trunk lean and weight shift then CGA during stand pivot to increase pt confidence and lower anxiety.  Pt self propelled w/c to sink with supervision.  Pt needing visual cues to initiate sinkside self care tasks. Setup to open toothpaste.  Min assist overall UB bathing and dressing. Mod assist LB dressing due to pt fearful of falling and resistive to unilateral release of upper extremity from sink countertop when standing.  May need to trial lateral leaning technique if this continues.  Pt setup seated in w/c with 50 piece puzzle at tabletop requiring min-mod visual cues and forward chaining to complete successfully. Call bell in reach, seat alarm on.  Therapy Documentation Precautions:  Precautions Precautions: Fall Precaution Comments: receptive > expressive aphasia, HoH Restrictions Weight Bearing Restrictions: No    Therapy/Group: Individual Therapy  Amie Critchley 11/29/2020, 9:57 AM

## 2020-11-29 NOTE — Progress Notes (Signed)
Occupational Therapy Session Note  Patient Details  Name: QUANITA BARONA MRN: [id number] Date of Birth: 1931-03-24  Today's Date: 11/29/2020 OT Individual Time: 1405-1430 OT Individual Time Calculation (min): 25 min    Short Term Goals: Week 1:  OT Short Term Goal 1 (Week 1): Pt will don LB clothing with max A OT Short Term Goal 2 (Week 1): Pt will complete sit > stand with mod A OT Short Term Goal 3 (Week 1): Pt will complete stand pivot with mod A  Skilled Therapeutic Interventions/Progress Updates:    Pt resting in w/c upon arrival. Pt initially hesitant to engage in any therapy but finally agreed to grooming tasks at sink. Pt required min multimodal cues to initiate washing face with min A. Attempted to facilitate forward leans while seated in w/c using HOH with pt resting BUE/hands in OTA's hands. Pt resistant but completed task X 3. Pt remained in w/c with all needs within reach and belt alarm activated.   Therapy Documentation Precautions:  Precautions Precautions: Fall Precaution Comments: receptive > expressive aphasia, HoH Restrictions Weight Bearing Restrictions: No Pain: Pt with no s/s of pain   Therapy/Group: Individual Therapy  Rich Brave 11/29/2020, 2:53 PM

## 2020-11-29 NOTE — Progress Notes (Signed)
Speech Language Pathology Daily Session Note  Patient Details  Name: Maria Dyer MRN: [id number] Date of Birth: 02/28/1931  Today's Date: 11/29/2020 SLP Individual Time: 1045-1130 SLP Individual Time Calculation (min): 45 min  Short Term Goals: Week 1: SLP Short Term Goal 1 (Week 1): Pt will respond to yes/no questions with 70% accuracy with max A written and visual cues. SLP Short Term Goal 2 (Week 1): Pt follow identify common objects/pictures in a field of 2 with 70% accuracy with mod A written cues. SLP Short Term Goal 3 (Week 1): Pt will name common objects with 80% accuracy with mod A written/sentence completion cues. SLP Short Term Goal 4 (Week 1): Pt will demonstrate self-monitoring and self-correction of verbal errors in 70% of opportunities with max A verbal cues. SLP Short Term Goal 5 (Week 1): Pt will write at word/ 2 word phrase level with 70% accuracy with mod A verbal cues. SLP Short Term Goal 6 (Week 1): Pt will demonstrate reading comprehension at 2 word phrase level by matching phrase to picture in a field 2 with 80% accuracy with mod A verbal cues.  Skilled Therapeutic Interventions:   Patient seen for skilled ST session focusing on cognitive-linguistic goals. Niece and other family member who are both familiar with patient both present in room for second half of session. They reported that prior to this CVA, patient was not exhibiting any cognitive issues, was living by herself, managing finances/paying bills, managing medications. When SLP arrived, patient was working on puzzle that family had brought. She had apparently started working on it with OT. She greeted SLP when she noticed his presence but went back to working on puzzle, not attempting to interact. SLP started providing visual and verbal cues for patient as she was having difficulty locating matching pieces. She was able to locate pieces when SLP described feature, "one with red on it", "half a horse", etc. SLP  introduced a calendar which patient was able to read. She named basic level verb/action pictures with 70% accuracy with some noted phonemic errors. She made toothbrushing motion when she could not name picture of someone brushing their teeth. SLP provided some education to family members regarding her aphasia and goals. Patient continues to benefit from skilled SLP intervention to maximize cognitive-linguistic goals prior to discharge. Pain Pain Assessment Pain Scale: Faces Pain Score: 0-No pain Faces Pain Scale: No hurt  Therapy/Group: Individual Therapy   Angela Nevin, MA, CCC-SLP Speech Therapy

## 2020-11-29 NOTE — Progress Notes (Signed)
Physical Therapy Session Note  Patient Details  Name: Maria Dyer MRN: [id number] Date of Birth: [birth year]  Today's Date: 11/29/2020 PT Individual Time: 1300-1355 PT Individual Time Calculation (min): 55 min   Short Term Goals: Week 1:  PT Short Term Goal 1 (Week 1): Pt will complete supine<>sit with minA PT Short Term Goal 2 (Week 1): Pt will complete sit<>stand transfers with minA PT Short Term Goal 3 (Week 1): Pt will complete bed<>chair transfers with modA PT Short Term Goal 4 (Week 1): Pt will initiate gait training  Skilled Therapeutic Interventions/Progress Updates:   Received pt sitting in [facility] with family present at bedside. Pt appeared agreeable to PT treatment and did not appear to be in any pain during session. Pt able to communicate with brief phrases throughout and with use of white board. Session with emphasis on functional mobility/transfers, generalized strengthening, standing tolerance, and improved endurance with activity. Pt transported to/from ortho gym in [facility] total A for time management purposes. Pt able to scoot to edge of WC for therapist to don standing frame harness. Sit<>stand trial 1 in standing frame with mod A as pt actually able to pull herself into standing using BUEs without use of harness. Pt very anxious upon standing stating "I can't, I can't"  but able to advance and take 1 step forward with each leg. Pt able to remain standing ~25 seconds prior to demanding to sit down. Performed x 2 additional trials with increased reliance on harness to pull her up due to posterior lean. Pt required maximal encouragement to participate constantly stating "I can't do this" and "I'll try another day". In room, continued blocked practice sit<>stands pulling up on bedrail with min A +2 x 3 trials. Pt does much better when family is present and tends to stand better when she knows something is in front of her. Pt's family reported not knowing when last time pt used bathroom was and  pt agreed to attempt to void with encouragement. Stand<>pivot WC<>bedside commode with max/total A and use of grab bars with cues for anterior weight shifting and hand placement. Pt sat before therapist could doff pants/brief, therefore removed brief/pants with total A via lateral leans. Pt able to void minimally. Pt unable to stand long enough for therapist to get pants over hips therefore stood at bedrail with mod A of 1 and required +2 assist to pull pants over hips. Concluded session with pt sitting in WC, needs within reach, and seatbelt alarm on.   Therapy Documentation Precautions:  Precautions Precautions: Fall Precaution Comments: receptive > expressive aphasia, HoH Restrictions Weight Bearing Restrictions: No  Therapy/Group: Individual Therapy Martin Majestic PT, DPT   11/29/2020, 7:35 AM

## 2020-11-30 MED ORDER — MODAFINIL 100 MG PO TABS
100.0000 mg | ORAL_TABLET | Freq: Every day | ORAL | Status: DC
  Administered 2020-12-01 – 2020-12-03 (×3): 100 mg via ORAL
  Filled 2020-11-30 (×3): qty 1

## 2020-11-30 NOTE — Progress Notes (Signed)
Physical Therapy Session Note  Patient Details  Name: Maria Dyer MRN: [id number] Date of Birth: 06/27/1930  Today's Date: 11/30/2020 PT Individual Time: 9323-5573 PT Individual Time Calculation (min): 53 min   Short Term Goals: Week 1:  PT Short Term Goal 1 (Week 1): Pt will complete supine<>sit with minA PT Short Term Goal 2 (Week 1): Pt will complete sit<>stand transfers with minA PT Short Term Goal 3 (Week 1): Pt will complete bed<>chair transfers with modA PT Short Term Goal 4 (Week 1): Pt will initiate gait training  Skilled Therapeutic Interventions/Progress Updates:   Pt received seated in [facility] with family present and agreeable to PT. Pt noted to have communication barrier, and thus a communication board was used to convey start of session and exercise instruction. Family also noted that pt had just finished prior session, which included first ambulation attempt, and thus she was reporting fatigue. Thus, session focused on functional transfers, dynamic standing balance, and seated activity endurance exercises. Pt completed sit to stand with MinA + B bed rails. Pt then completed 1 x10 reaches inside of BOS, followed by a seated rest, and then another stand with high knee marching for 1x10 reps. Pt rested between rounds of exercise, and completed 3 rounds of reaching and marching. Pt then transferred to therapy gym for time conservation. Pt attempted resisted seated marching, but then reported high fatigue, and desire to end session. Pt continued to report fatigue despite encouragement. Pt then returned to room and practiced functional transfers. Pt completed 4x sit to stand with STEDY and MinA for attempt to return to bed, but pt was unable to stand up tall enough for device function. Thus, pt then transferred to bed with MinA +2 for stand-pivot transfer. Pt then left in bed with alarm engaged, call bell and all needs in reach.  Therapy Documentation Precautions:  Precautions Precautions:  Fall Precaution Comments: receptive > expressive aphasia, HoH Restrictions Weight Bearing Restrictions: No  Pain: Pain Assessment Pain Scale: 0-10 Pain Score: 0-No pain   Therapy/Group: Individual Therapy  Perrin Maltese, PT, DPT 11/30/2020, 3:36 PM

## 2020-11-30 NOTE — Progress Notes (Signed)
Occupational Therapy Session Note  Patient Details  Name: Maria Dyer MRN: [id number] Date of Birth: [birth year]  Today's Date: 11/30/2020 OT Individual Time: 6301-6010 and 1345-1415 OT Individual Time Calculation (min): 55 min and 30 min    Short Term Goals: Week 1:  OT Short Term Goal 1 (Week 1): Pt will don LB clothing with max A OT Short Term Goal 2 (Week 1): Pt will complete sit > stand with mod A OT Short Term Goal 3 (Week 1): Pt will complete stand pivot with mod A      Skilled Therapeutic Interventions/Progress Updates:    Visit 1: Pain: no c/o pain  Pt received in bed with family in the room. They shared that their goal is to not have her return home but to an ALF and their hopes are that therapy can get her mobile enough to be able to discharge there.  Used multimodal communication methods (demonstration, pointing, dry erase board) (attempted ear phones) to communicate with pt.  She frequently would say no, or no thank you.  With manual guiding was able to get pt to EOB and then she agreed to sit in wc. Max stand pivot, tried to get her to use her 3 wheeled walker but did better with a stand pivot.  She had a shirt and brief on but no pants. Handed her shorts and she did well getting her feet into pants.  To rise to stand had her face side of bed with bed rail elevated.  Pt pulled to stand with min A using B hands but could not stand up fully straight (pt's family reports this is her PLOF).  Max encouragement to have her alternate hands to pull up her pants with max A.  Difficult to get over hips due to pt leaning forward so her hips were sticking out.    Took pt to sink to have her stand at sink 1 x which she did but did not want to try to brush hair in standing.   Then took pt to bathroom to use B hands on bars to stand pivot to toilet which she did with min to mod A.  Total with clothing management, pt then urinated and self cleansed with cues.  Pt very eager to get back to  w/c. Pivoted to wc without first pulling up pants so had to stand again to get pants up. Pulled to stand with supervision.    Took pt to sink and had her stand to wash her hands with mod A.  Did better with demonstration first.   Overall pt did end up standing several times in session ,but with 2 hands on a solid surface like the sink or bed rail or grab bar.  Her standing tolerance is only about 20 seconds.  She did gradually improve.    Pt resting in wc with belt alarm on and all needs met.   Visit 2: Pain: no c/o pain Pt received in w/c with family in the room. For the first part of the session, took pt down to the gym and had her engage in a FM task of placing pieces of a small puzzle together.  Had her copy my design with 50% accuracy.  Had pt trial EVA walker with her family providing wc follow and encouragement to take steps.  Pt fearful and needed mod A to rise to stand, she kept saying "I cant do it' but once I pushed walker forward her feet followed and she started  walking!  She actually walked with mod A with EVA walker for approximately 25 feet.  We all applauded her good effort.  Pt returned to room with family and all needs met.    Therapy Documentation Precautions:  Precautions Precautions: Fall Precaution Comments: receptive > expressive aphasia, HoH Restrictions Weight Bearing Restrictions: No  Pain: Pain Assessment Pain Scale: 0-10 Pain Score: 0-No pain    Therapy/Group: Individual Therapy  [place] 11/30/2020, 10:20 AM

## 2020-11-30 NOTE — Progress Notes (Signed)
Speech Language Pathology Daily Session Note  Patient Details  Name: Maria Dyer MRN: [id number] Date of Birth: Mar 10, 1931  Today's Date: 11/30/2020 SLP Individual Time: 0800-0900 SLP Individual Time Calculation (min): 60 min  Short Term Goals: Week 1: SLP Short Term Goal 1 (Week 1): Pt will respond to yes/no questions with 70% accuracy with max A written and visual cues. SLP Short Term Goal 2 (Week 1): Pt follow identify common objects/pictures in a field of 2 with 70% accuracy with mod A written cues. SLP Short Term Goal 3 (Week 1): Pt will name common objects with 80% accuracy with mod A written/sentence completion cues. SLP Short Term Goal 4 (Week 1): Pt will demonstrate self-monitoring and self-correction of verbal errors in 70% of opportunities with max A verbal cues. SLP Short Term Goal 5 (Week 1): Pt will write at word/ 2 word phrase level with 70% accuracy with mod A verbal cues. SLP Short Term Goal 6 (Week 1): Pt will demonstrate reading comprehension at 2 word phrase level by matching phrase to picture in a field 2 with 80% accuracy with mod A verbal cues.  Skilled Therapeutic Interventions: Patient agreeable to skilled ST intervention with focus on language goals. SLP wore clear mask during session. Upon arrival, patient gestured to indicate brushing teeth and expressed "I don't know where it is." Facilitated oral care at bed level with set-up A. Pt often expressed her appreciation by expressing "thank you" and some stereotypical spontaneous responses. Facilitated yes/no questions with max A multimodal cues and approximately 10% accuracy when asked verbally. Patient known to nod her head yes with most questions. Patient verbally read written cues for Y/N questions at word and phrase level with 100% accuracy, however this did not improve accuracy of Y/N responses. Patient adamantly denied having a stroke or being in the hospital during orientation questions and appeared flustered after  discussion on having a stroke. Facilitated object naming with max A multimodal cues and 65% accuracy. Pt exhibited frequent semantic and phonemic paraphasias, and neologisms (e.g., "timer" for computer, "telephone" for book.) Written responses mimicked verbal expression. For example, when asked what did you have for breakfast patient wrote "or (for orange) table, melon miscoief." Facilitated object identification in field of 2 with max multimodal cues and 57% accuracy, progressing to 85% accuracy with written reinforcement. Pt displayed difficulty comprehending task instructions and often named objects vs. waiting on SLP's prompts. Pt was unable to verbally repeat at word level, but able when given written word. Patient continues to benefit from skilled SLP intervention to maximize language abilities prior to discharge. Patient was left in bed with alarm activated and immediate needs within reach at end of session. Continue per current plan of care.      Pain Pain Assessment Pain Scale: 0-10 Pain Score: 0-No pain  Therapy/Group: Individual Therapy  Tamala Ser 11/30/2020, 8:18 AM

## 2020-11-30 NOTE — Progress Notes (Signed)
PROGRESS NOTE   Subjective/Complaints: No complaints this morning.   ROS: +hearing loss, +bruising, +impaired cognition, denies pain  Objective:   No results found. No results for input(s): WBC, HGB, HCT, PLT in the last 72 hours.  No results for input(s): NA, K, CL, CO2, GLUCOSE, BUN, CREATININE, CALCIUM in the last 72 hours.   Intake/Output Summary (Last 24 hours) at 11/30/2020 1702 Last data filed at 11/30/2020 1315 Gross per 24 hour  Intake 700 ml  Output --  Net 700 ml         Physical Exam: Vital Signs Blood pressure 117/60, pulse 79, temperature 97.8 F (36.6 C), temperature source Oral, resp. rate 17, height 5\' 9"  (1.753 m), weight 59.3 kg, SpO2 98 %. Gen: no distress, normal appearing HEENT: oral mucosa pink and moist, NCAT, hard of hearing Cardio: Reg rate Chest: normal effort, normal rate of breathing Abd: soft, non-distended Ext: no edema Psych: pleasant, normal affect, smiling Skin: bruising on both arms Neurological/MSK    Comments: Patient is severely hard of hearing.  Patient is awake alert, eyes are open.  Globally aphasic however she did have some short intermittent meaningful  sentences. She did not follow commands. ModA gait within room. Impaired cognition Supine to sit MinA Transfers to bed MinAx2   Assessment/Plan: 1. Functional deficits which require 3+ hours per day of interdisciplinary therapy in a comprehensive inpatient rehab setting. Physiatrist is providing close team supervision and 24 hour management of active medical problems listed below. Physiatrist and rehab team continue to assess barriers to discharge/monitor patient progress toward functional and medical goals  Care Tool:  Bathing    Body parts bathed by patient: Right arm, Left arm, Chest, Abdomen, Face, Right upper leg, Left upper leg   Body parts bathed by helper: Front perineal area, Buttocks, Right lower leg, Left  lower leg     Bathing assist Assist Level: 2 Helpers     Upper Body Dressing/Undressing Upper body dressing   What is the patient wearing?: Pull over shirt    Upper body assist Assist Level: Minimal Assistance - Patient > 75%    Lower Body Dressing/Undressing Lower body dressing      What is the patient wearing?: Pants, Incontinence brief     Lower body assist Assist for lower body dressing: Maximal Assistance - Patient 25 - 49%     Toileting Toileting    Toileting assist Assist for toileting: Maximal Assistance - Patient 25 - 49%     Transfers Chair/bed transfer  Transfers assist     Chair/bed transfer assist level: Maximal Assistance - Patient 25 - 49% (stand pivot)     Locomotion Ambulation   Ambulation assist   Ambulation activity did not occur: Refused          Walk 10 feet activity   Assist  Walk 10 feet activity did not occur: Refused        Walk 50 feet activity   Assist Walk 50 feet with 2 turns activity did not occur: Refused         Walk 150 feet activity   Assist Walk 150 feet activity did not occur: Refused  Walk 10 feet on uneven surface  activity   Assist Walk 10 feet on uneven surfaces activity did not occur: Refused         Wheelchair     Assist Is the patient using a wheelchair?: Yes Type of Wheelchair: Manual    Wheelchair assist level: Dependent - Patient 0%      Wheelchair 50 feet with 2 turns activity    Assist        Assist Level: Dependent - Patient 0%   Wheelchair 150 feet activity     Assist      Assist Level: Dependent - Patient 0%   Blood pressure 117/60, pulse 79, temperature 97.8 F (36.6 C), temperature source Oral, resp. rate 17, height 5\' 9"  (1.753 m), weight 59.3 kg, SpO2 98 %.  Medical Problem List and Plan: 1.  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  Impaired mobility: -DVT/anticoagulation:  Mechanical: Sequential compression devices, below knee Bilateral lower extremities             -antiplatelet therapy: Continue Aspirin 81 mg daily and Brilinta 90 mg twice daily 3. Pain Management: N/A 4. Mood: Provide emotional support             -antipsychotic agents: N/A 5. Neuropsych: This patient is not capable of making decisions on her own behalf. 6. Skin/Wound Care: Routine skin checks 7. Fluids/Electrolytes/Nutrition: Routine in and outs with follow-up chemistries 8.  Hyperlipidemia.  Continue Lipitor 9.  Hypothyroidism.  Continue Synthroid 10.  Hypertension.  Labile, continue Cozaar 100 mg daily, Toprol-XL 50 mg daily.  Add amlodipine 2.5mg . Monitor with increased mobility. Magnesium level reviewed and normal 11.  MASD buttocks and groin.  Zinc oxide as directed with routine skin checks. continue diflucan per pharmacy consult.  12. AKI: Normalized. Placed nursing order to encourage 6-8 glasses of water per day. 13. Hard of hearing: Try to wear see through masks when communicating with her 14. Low protein: continue Juven supplementation.  15. Fatigue: will trial Modafinil 100mg  daily  LOS: 5 days A FACE TO FACE EVALUATION WAS PERFORMED  P Eitan Doubleday 11/30/2020, 5:02 PM

## 2020-12-01 MED ORDER — SODIUM CHLORIDE 0.9 % IV SOLN: INTRAVENOUS | Status: DC

## 2020-12-01 MED ORDER — AMLODIPINE BESYLATE 5 MG PO TABS
5.0000 mg | ORAL_TABLET | Freq: Every day | ORAL | Status: DC
  Administered 2020-12-02 – 2020-12-12 (×11): 5 mg via ORAL
  Filled 2020-12-01 (×11): qty 1

## 2020-12-01 MED ORDER — MEGESTROL ACETATE 40 MG PO TABS
40.0000 mg | ORAL_TABLET | Freq: Every day | ORAL | Status: DC
  Administered 2020-12-01 – 2020-12-06 (×6): 40 mg via ORAL
  Filled 2020-12-01 (×6): qty 1

## 2020-12-01 NOTE — Progress Notes (Signed)
PROGRESS NOTE   Subjective/Complaints: No complaints this morning. As per RN she is not eating much- will give IV fluids today and start megace before lunch  ROS: +hearing loss, +bruising, +impaired cognition, denies pain, decreased appetites as per nursing  Objective:   No results found. No results for input(s): WBC, HGB, HCT, PLT in the last 72 hours.  No results for input(s): NA, K, CL, CO2, GLUCOSE, BUN, CREATININE, CALCIUM in the last 72 hours.   Intake/Output Summary (Last 24 hours) at 12/01/2020 1105 Last data filed at 12/01/2020 0800 Gross per 24 hour  Intake 260 ml  Output --  Net 260 ml         Physical Exam: Vital Signs Blood pressure (!) 154/53, pulse 82, temperature 97.6 F (36.4 C), resp. rate 19, height 5\' 9"  (1.753 m), weight 59.3 kg, SpO2 94 %. Gen: no distress, normal appearing HEENT: oral mucosa pink and moist, NCAT, hard of hearing Cardio: Reg rate Chest: normal effort, normal rate of breathing Abd: soft, non-distended Ext: no edema Psych: pleasant, normal affect, smiling Skin: bruising on both arms Neurological/MSK    Comments: Patient is severely hard of hearing.  Patient is awake alert, eyes are open.  Receptive> expressive aphasia. She did not follow commands. ModA gait within room. Impaired cognition Supine to sit MinA Transfers to bed MinAx2   Assessment/Plan: 1. Functional deficits which require 3+ hours per day of interdisciplinary therapy in a comprehensive inpatient rehab setting. Physiatrist is providing close team supervision and 24 hour management of active medical problems listed below. Physiatrist and rehab team continue to assess barriers to discharge/monitor patient progress toward functional and medical goals  Care Tool:  Bathing    Body parts bathed by patient: Right arm, Left arm, Chest, Abdomen, Face, Right upper leg, Left upper leg   Body parts bathed by helper: Front  perineal area, Buttocks, Right lower leg, Left lower leg     Bathing assist Assist Level: 2 Helpers     Upper Body Dressing/Undressing Upper body dressing   What is the patient wearing?: Pull over shirt    Upper body assist Assist Level: Minimal Assistance - Patient > 75%    Lower Body Dressing/Undressing Lower body dressing      What is the patient wearing?: Pants, Incontinence brief     Lower body assist Assist for lower body dressing: Maximal Assistance - Patient 25 - 49%     Toileting Toileting    Toileting assist Assist for toileting: Maximal Assistance - Patient 25 - 49%     Transfers Chair/bed transfer  Transfers assist     Chair/bed transfer assist level: Maximal Assistance - Patient 25 - 49% (stand pivot)     Locomotion Ambulation   Ambulation assist   Ambulation activity did not occur: Refused          Walk 10 feet activity   Assist  Walk 10 feet activity did not occur: Refused        Walk 50 feet activity   Assist Walk 50 feet with 2 turns activity did not occur: Refused         Walk 150 feet activity   Assist Walk  150 feet activity did not occur: Refused         Walk 10 feet on uneven surface  activity   Assist Walk 10 feet on uneven surfaces activity did not occur: Refused         Wheelchair     Assist Is the patient using a wheelchair?: Yes Type of Wheelchair: Manual    Wheelchair assist level: Dependent - Patient 0%      Wheelchair 50 feet with 2 turns activity    Assist        Assist Level: Dependent - Patient 0%   Wheelchair 150 feet activity     Assist      Assist Level: Dependent - Patient 0%   Blood pressure (!) 154/53, pulse 82, temperature 97.6 F (36.4 C), resp. rate 19, height 5\' 9"  (1.753 m), weight 59.3 kg, SpO2 94 %.  Medical Problem List and Plan: 1.  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  Impaired mobility: -DVT/anticoagulation:  Mechanical: Sequential compression devices, below knee Bilateral lower extremities             -antiplatelet therapy: Continue Aspirin 81 mg daily and Brilinta 90 mg twice daily 3. Pain Management: N/A 4. Mood: Provide emotional support             -antipsychotic agents: N/A 5. Neuropsych: This patient is not capable of making decisions on her own behalf. 6. Skin/Wound Care: Routine skin checks 7. Fluids/Electrolytes/Nutrition: Routine in and outs with follow-up chemistries 8.  Hyperlipidemia.  Continue Lipitor 9.  Hypothyroidism.  Continue Synthroid 10.  Hypertension.  Labile, continue Cozaar 100 mg daily, Toprol-XL 50 mg daily.  Increase amlodipine to 5mg . Monitor with increased mobility. Magnesium level reviewed and normal.  11.  MASD buttocks and groin.  Zinc oxide as directed with routine skin checks. continue diflucan per pharmacy consult.  12. AKI: Normalized. Placed nursing order to encourage 6-8 glasses of water per day. 13. Hard of hearing: Try to wear see through masks when communicating with her 14. Low protein: continue Juven supplementation.  15. Fatigue: will trial Modafinil 100mg  daily 15. Decreased appetite: IV fluids for 8 hours today. Started Megace for appetite stimulation.   LOS: 6 days A FACE TO FACE EVALUATION WAS PERFORMED  Maria Dyer P Maria Dyer 12/01/2020, 11:05 AM

## 2020-12-02 LAB — CBC
HCT: 31 % — ABNORMAL LOW (ref 36.0–46.0)
Hemoglobin: 10.3 g/dL — ABNORMAL LOW (ref 12.0–15.0)
MCH: 31.7 pg (ref 26.0–34.0)
MCHC: 33.2 g/dL (ref 30.0–36.0)
MCV: 95.4 fL (ref 80.0–100.0)
Platelets: 300 10*3/uL (ref 150–400)
RBC: 3.25 MIL/uL — ABNORMAL LOW (ref 3.87–5.11)
RDW: 13.6 % (ref 11.5–15.5)
WBC: 6.8 10*3/uL (ref 4.0–10.5)
nRBC: 0 % (ref 0.0–0.2)

## 2020-12-02 LAB — BASIC METABOLIC PANEL
Anion gap: 11 (ref 5–15)
BUN: 20 mg/dL (ref 8–23)
CO2: 19 mmol/L — ABNORMAL LOW (ref 22–32)
Calcium: 9.1 mg/dL (ref 8.9–10.3)
Chloride: 109 mmol/L (ref 98–111)
Creatinine, Ser: 0.94 mg/dL (ref 0.44–1.00)
GFR, Estimated: 58 mL/min — ABNORMAL LOW (ref >60)
Glucose, Bld: 103 mg/dL — ABNORMAL HIGH (ref 70–99)
Potassium: 3.5 mmol/L (ref 3.5–5.1)
Sodium: 139 mmol/L (ref 135–145)

## 2020-12-02 NOTE — Progress Notes (Signed)
Physical Therapy Session Note  Patient Details  Name: Maria Dyer MRN: [id number] Date of Birth: 12-27-1930  Today's Date: 12/02/2020 PT Individual Time: 1415-1500 PT Individual Time Calculation (min): 45 min   Short Term Goals: Week 1:  PT Short Term Goal 1 (Week 1): Pt will complete supine<>sit with minA PT Short Term Goal 2 (Week 1): Pt will complete sit<>stand transfers with minA PT Short Term Goal 3 (Week 1): Pt will complete bed<>chair transfers with modA PT Short Term Goal 4 (Week 1): Pt will initiate gait training  Skilled Therapeutic Interventions/Progress Updates:     Pt seated in w/c at start of session with great niece at bedside. Niece present throughout session for active encouragement for patient to participate due to her significant fearfulness of falling and her frequent refusal for efforts to participating with mobility sessions. Niece reports that she never walks far distances at baseline only short household distances. Pt wheeled in w/c to main rehab hallway. Provided her 3 wheeled rollator in hopes of familiarity to improve confidence with mobility. She was able to stand with minA to her rollator and ambulated variable distances with her niece providing w/c follow for safety - 61ft + 23ft + 61ft + 28ft with min/modA. Gait deficits include short shuffling steps with a "crouched" stance, decreased R>L step length, and required assist for rollator management to keep from getting away from her. B/w each gait trial, she would say "I cant. Please. No more. I will tomorrow." Niece provided max encouragement during session which helped. Pt also benefited from visual goals/targets to encourage her - such as placing an arm chair ~10-33ft away from her for goal. Pt returned to her room in w/c at end of session, applied safety belt alarm, and niece remained at bedside. All needs within reach at conclusion of session.  Therapy Documentation Precautions:  Precautions Precautions:  Fall Precaution Comments: receptive > expressive aphasia, HoH Restrictions Weight Bearing Restrictions: No General:    Therapy/Group: Individual Therapy  Orrin Brigham 12/02/2020, 7:47 AM

## 2020-12-02 NOTE — Progress Notes (Signed)
PROGRESS NOTE   Subjective/Complaints:  Pt reports she's wet; and needs to be changed.  No other complaints.   ROS: impaired by cognition Objective:   No results found. Recent Labs    12/02/20 0503  WBC 6.8  HGB 10.3*  HCT 31.0*  PLT 300    Recent Labs    12/02/20 0503  NA 139  K 3.5  CL 109  CO2 19*  GLUCOSE 103*  BUN 20  CREATININE 0.94  CALCIUM 9.1     Intake/Output Summary (Last 24 hours) at 12/02/2020 1111 Last data filed at 12/01/2020 1812 Gross per 24 hour  Intake 624.01 ml  Output --  Net 624.01 ml         Physical Exam: Vital Signs Blood pressure 129/70, pulse 73, temperature 98.7 F (37.1 C), temperature source Oral, resp. rate 18, height 5\' 9"  (1.753 m), weight 59.3 kg, SpO2 99 %.    General: awake, alert, appropriate, pulling at depends;  NAD HENT: conjugate gaze; oropharynx moist CV: regular rate; no JVD Pulmonary: CTA B/L; no W/R/R- good air movement GI: soft, NT, ND, (+)BS Psychiatric: appropriate- very HOH Neurological: Ox0-1- wasn't able to give me her name without cues  Ext: no edema Psych: pleasant, normal affect, smiling Skin: bruising on both arms Neurological/MSK    Comments: Patient is severely hard of hearing.  Patient is awake alert, eyes are open.  Receptive> expressive aphasia. She did not follow commands. ModA gait within room. Impaired cognition Supine to sit MinA Transfers to bed MinAx2   Assessment/Plan: 1. Functional deficits which require 3+ hours per day of interdisciplinary therapy in a comprehensive inpatient rehab setting. Physiatrist is providing close team supervision and 24 hour management of active medical problems listed below. Physiatrist and rehab team continue to assess barriers to discharge/monitor patient progress toward functional and medical goals  Care Tool:  Bathing    Body parts bathed by patient: Right arm, Left arm, Chest, Abdomen,  Face, Right upper leg, Left upper leg   Body parts bathed by helper: Front perineal area, Buttocks, Right lower leg, Left lower leg     Bathing assist Assist Level: 2 Helpers     Upper Body Dressing/Undressing Upper body dressing   What is the patient wearing?: Pull over shirt    Upper body assist Assist Level: Minimal Assistance - Patient > 75%    Lower Body Dressing/Undressing Lower body dressing      What is the patient wearing?: Pants, Incontinence brief     Lower body assist Assist for lower body dressing: Maximal Assistance - Patient 25 - 49%     Toileting Toileting    Toileting assist Assist for toileting: Maximal Assistance - Patient 25 - 49%     Transfers Chair/bed transfer  Transfers assist     Chair/bed transfer assist level: Maximal Assistance - Patient 25 - 49% (stand pivot)     Locomotion Ambulation   Ambulation assist   Ambulation activity did not occur: Refused          Walk 10 feet activity   Assist  Walk 10 feet activity did not occur: Refused        Walk 50  feet activity   Assist Walk 50 feet with 2 turns activity did not occur: Refused         Walk 150 feet activity   Assist Walk 150 feet activity did not occur: Refused         Walk 10 feet on uneven surface  activity   Assist Walk 10 feet on uneven surfaces activity did not occur: Refused         Wheelchair     Assist Is the patient using a wheelchair?: Yes Type of Wheelchair: Manual    Wheelchair assist level: Dependent - Patient 0%      Wheelchair 50 feet with 2 turns activity    Assist        Assist Level: Dependent - Patient 0%   Wheelchair 150 feet activity     Assist      Assist Level: Dependent - Patient 0%   Blood pressure 129/70, pulse 73, temperature 98.7 F (37.1 C), temperature source Oral, resp. rate 18, height 5\' 9"  (1.753 m), weight 59.3 kg, SpO2 99 %.  Medical Problem List and Plan: 1.  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  Impaired mobility: -DVT/anticoagulation:  Mechanical: Sequential compression devices, below knee Bilateral lower extremities             -antiplatelet therapy: Continue Aspirin 81 mg daily and Brilinta 90 mg twice daily 3. Pain Management: N/A 4. Mood: Provide emotional support             -antipsychotic agents: N/A 5. Neuropsych: This patient is not capable of making decisions on her own behalf. 6. Skin/Wound Care: Routine skin checks 7. Fluids/Electrolytes/Nutrition: Routine in and outs with follow-up chemistries 8.  Hyperlipidemia.  Continue Lipitor 9.  Hypothyroidism.  Continue Synthroid 10.  Hypertension.  Labile, continue Cozaar 100 mg daily, Toprol-XL 50 mg daily.  Increase amlodipine to 5mg . Monitor with increased mobility. Magnesium level reviewed and normal.  11.  MASD buttocks and groin.  Zinc oxide as directed with routine skin checks. continue diflucan per pharmacy consult.  12. AKI: Normalized. Placed nursing order to encourage 6-8 glasses of water per day. 13. Hard of hearing: Try to wear see through masks when communicating with her 14. Low protein: continue Juven supplementation.  15. Fatigue: will trial Modafinil 100mg  daily 15. Decreased appetite: IV fluids for 8 hours today. Started Megace for appetite stimulation. 9/5- BUN and Cr look OK s/p fluids- con't to monitor- suggest labs 2x/week.    LOS: 7 days A FACE TO FACE EVALUATION WAS PERFORMED  Maria Dyer 12/02/2020, 11:11 AM

## 2020-12-02 NOTE — Progress Notes (Signed)
Speech Language Pathology Weekly Progress and Session Note  Patient Details  Name: Maria Dyer MRN: [id number] Date of Birth: Aug 08, 1930  Beginning of progress report period: November 26, 2021 End of progress report period: December 02, 2021  Today's Date: 12/02/2020 SLP Individual Time: 0830-0930 SLP Individual Time Calculation (min): 60 min  Short Term Goals: Week 1: SLP Short Term Goal 1 (Week 1): Pt will respond to yes/no questions with 70% accuracy with max A written and visual cues. SLP Short Term Goal 1 - Progress (Week 1): Progressing toward goal SLP Short Term Goal 2 (Week 1): Pt follow identify common objects/pictures in a field of 2 with 70% accuracy with mod A written cues. SLP Short Term Goal 2 - Progress (Week 1): Progressing toward goal SLP Short Term Goal 3 (Week 1): Pt will name common objects with 80% accuracy with mod A written/sentence completion cues. SLP Short Term Goal 3 - Progress (Week 1): Met SLP Short Term Goal 4 (Week 1): Pt will demonstrate self-monitoring and self-correction of verbal errors in 70% of opportunities with max A verbal cues. SLP Short Term Goal 4 - Progress (Week 1): Not met SLP Short Term Goal 5 (Week 1): Pt will write at word/ 2 word phrase level with 70% accuracy with mod A verbal cues. SLP Short Term Goal 5 - Progress (Week 1): Not met SLP Short Term Goal 6 (Week 1): Pt will demonstrate reading comprehension at 2 word phrase level by matching phrase to picture in a field 2 with 80% accuracy with mod A verbal cues. SLP Short Term Goal 6 - Progress (Week 1): Progressing toward goal    New Short Term Goals: Week 2: SLP Short Term Goal 1 (Week 2): Pt will respond to yes/no questions with 70% accuracy with max A written and visual cues. SLP Short Term Goal 2 (Week 2): Pt follow identify common objects/pictures in a field of 2 with 70% accuracy with mod A written cues. SLP Short Term Goal 3 (Week 2): Pt will demonstrate self-monitoring and  self-correction of verbal errors in 50% of opportunities with max A verbal cues. SLP Short Term Goal 4 (Week 2): Pt will write at word/ 2 word phrase level with 70% accuracy with mod A verbal cues. SLP Short Term Goal 5 (Week 2): Pt will demonstrate reading comprehension at 2 word phrase level by matching phrase to picture in a field 2 with 75% accuracy with mod A verbal cues.  Weekly Progress Updates: Pt making slow and inconsistent progress meeting 1 of 6 short term goals this reporting period, however pt is progressing toward 3 of remaining 5 goals. Progress impacted by decreased awareness (impairments, hospitalization, CVA dx) and ongoing semantic and phonemic paraphasia. Pt often producing semi-fluent phrases in unstructured conversation, decreased expressive/receptive accuracy during structured tasks. Pt continues to benefit from written aid to increase comprehension. Per EMR, pt family wants pt to discharge to ALF, ST continues to recommend 24/7 supervision/assist due to language and cognitive impairments. Pt will continue to benefit from skilled ST in [place]. Continue speech/language goals with modifications to cueing level and % accuracy due to slow progress this week.   Intensity: Minumum of 1-2 x/day, 30 to 90 minutes Frequency: 3 to 5 out of 7 days Duration/Length of Stay: 21-24 days Treatment/Interventions: Cognitive remediation/compensation;Cueing hierarchy;Functional tasks;Internal/external aids;Multimodal communication approach;Patient/family education;Speech/Language facilitation   Daily Session  Skilled Therapeutic Interventions: Pt seen for skilled ST with focus on communication goals, SLP wearing clear mask and utilizing white board to  increase comprehension. Pt noted with AM meal on tray untouched, therefore SLP providing set up assistance and verbal/written cueing (staff reports poor PO recently). Pt benefits from cues to attend to meal, therefore changed to set up + intermittent  assist for meals to increase initiation and promote PO intake. SLP facilitating yes/no questions related to basic wants and needs by providing max A multimodal cues with ~40% accuracy. Pt does tend to perseverate on nodding head and saying "yes". Pt naming common objects on tray and in room with 50% accuracy mod A. When pt attempting to speak in more than 2 word phrases, significant increase in phonemic and semantic paraphasia impacting communication. Pt with inconsistent awareness of communication break down, at times recognizing target word incorrect and trying again. Pt continues to benefit from skilled ST intervention to maximize language abilities prior to d/c (family hoping ALF). Pt left in bed with alarm set and all needs within reach. Cont ST POC.   General    Pain Pain Assessment Pain Scale: 0-10 Pain Score: 0-No pain  Therapy/Group: Individual Therapy  Dewaine Conger 12/02/2020, 12:00 PM

## 2020-12-02 NOTE — Progress Notes (Signed)
Physical Therapy Session Note  Patient Details  Name: Maria Dyer MRN: [id number] Date of Birth: 1930/05/25  Today's Date: 12/02/2020 PT Individual Time: 1601-0932 and 1307-1350 PT Individual Time Calculation (min): 19 min  and 43 min and  Today's Date: 12/02/2020 PT Missed Time: 11 Minutes Missed Time Reason: Patient fatigue;Patient unwilling to participate  Short Term Goals: Week 1:  PT Short Term Goal 1 (Week 1): Pt will complete supine<>sit with minA PT Short Term Goal 2 (Week 1): Pt will complete sit<>stand transfers with minA PT Short Term Goal 3 (Week 1): Pt will complete bed<>chair transfers with modA PT Short Term Goal 4 (Week 1): Pt will initiate gait training  Skilled Therapeutic Interventions/Progress Updates:    Session 1: Pt received asleep, supine in bed. Smiling when awakening and seeing therapist. Appears agreeable to session. Supine>sitting L EOB, HOB partially elevated nad using bedrial, min assist via R HHA for trunk upright. Sitting EOB with intermittent min/mod assist for trunk control due to posterior lean/LOB while donning clean shorts max assist and then shirt with min assist. Sit>stand EOB>B UE support on RW with heavy mod assist for lifting to stand and pt still very fearful to come to full upright stand quickly returning to sitting down once her hips are lifted off of the bed requiring 3 attempts at standing in order to pull pants up over hips total assist. Once clean clothes donned, patient requesting to lie back down and initiates movement to lay down saying "thank you." Attetmpted to encouarge pt to transfer to w/c but pt politely though adamantly declines. Pt left supine in bed with needs in reach and bed alarm on.  Session 2: Pt received sitting in w/c with her great-grand niece, Maria Dyer, present assisting with meal and pt agreeable to therapy session with family encouragement.  Transported to/from gym in w/c for time management and energy conservation.  Sit>stand w/c>B UE support on Eva walker with mod assist for lifting to stand. Gait training ~13ft, ~64ft, ~32ft using Carley Hammed walker with +2 providing very close w/c follow and mod assist from therapist for AD management and balance - pt demos significantly crouched gait with trunk/hip/knee flexed posturing and very short, shuffled steps and pt repeatedly stating "I can't" while ambulating. After 1st gait trial therapist retrieved pt's family member to provide encouragement during session as pt resistant to additional gait trials or therapy without family support/encouragement. Therapist utilized written communication during session but unsure of how much pt comprehended.    Lateral scoot transfer w/c<>EOM with heavy max assist for lifting/pivoting hips as pt with lack of anterior trunk weight shift to allow hip clearance during transfer. Sitting at significantly elevated EOM with Carley Hammed walker in front of pt, participated in anterior trunk leaning task promoting B LE WBing in high perched position with goal of progressing into standing while completing cognitive task of card matching - requires mod assist for lifting to stand and pt's family member's encouragement. At end of session, pt transported back to room and left seated in w/c with needs in reach, seat belt alarm on, family and NT present.  Therapy Documentation Precautions:  Precautions Precautions: Fall Precaution Comments: receptive > expressive aphasia, HoH Restrictions Weight Bearing Restrictions: No   Pain:  Session 1: No indication of pain during session.   Session 2: Pt states "it hurts" when standing but difficult to determine if pt meant "hurt" or if trying to communicate that standing/ambulating is "hard" - therapist providing written questions for clarification but pt  just responds "I don't know." Otherwise no indications of pain during session.   Therapy/Group: Individual Therapy  Ginny Forth , PT, DPT, NCS, CSRS 12/02/2020,  7:57 AM

## 2020-12-02 NOTE — Progress Notes (Signed)
Occupational Therapy Session Note  Patient Details  Name: NIA NATHANIEL MRN: [id number] Date of Birth: 08-21-1930  Today's Date: 12/02/2020 OT Individual Time: 1510-1537 OT Individual Time Calculation (min): 27 min    Short Term Goals: Week 1:  OT Short Term Goal 1 (Week 1): Pt will don LB clothing with max A OT Short Term Goal 2 (Week 1): Pt will complete sit > stand with mod A OT Short Term Goal 3 (Week 1): Pt will complete stand pivot with mod A  Skilled Therapeutic Interventions/Progress Updates:    Pt sitting in wheelchair with her family member present.  Attempted working on sit to stand with use of the walker as well as toilet transfers during session.  Pt needed max coaxing to participate stating "tomorrow".  When asked specific questions with regards to the year or her niece's name, she was unable to answer appropriately secondary to receptive difficulties.  She completed sit to stand X 1 with the RW place in front of her and overall min assist.  She sat down abruptly after standing for 20 seconds when asked to take a step forward.  Flexed trunk and flexed knees noted during brief stand.  Therapist took her into the bathroom in the wheelchair where she then completed toilet transfer to the 3:1 over the toilet.  Transfer was completed with overall mod assist secondary to difficulty advancing her LEs and turning all the way to the seat.  A second sit to stand was needed for managing clothing at total assist level.  After sitting for a couple of mins, she was able to void with total assist needed for donning new brief, secondary to the old one having some urine in it from earlier episode.  Had her work on functional mobility out to the bed from the toilet.  She was only able to advance to the doorway with use of the RW, where she sat down on a shower seat brought up to her.  Pt continues to be limited by fear as well as decreased LE strength and coordination.  She finished walking around the  foot of the bed and up to the side with use of her three wheeled walker in attempt to see if it made her initiate better.  She continued to let go of the walker on the left and grab the foot of the bed instead.  Max assist to complete ambulation back to the side of the bed.  Supervision for removal of her slip on shoes with min guard for transfer to supine.  Mod assist was needed for scooting up in the bed.  Call button and phone in reach with safety alarm in place and niece present at end of session.    Therapy Documentation Precautions:  Precautions Precautions: Fall Precaution Comments: receptive > expressive aphasia, HoH Restrictions Weight Bearing Restrictions: No  Pain: Pain Assessment Pain Scale: Faces Pain Score: 0-No pain ADL: See Care Tool Section for some details of mobility and selfcare   Therapy/Group: Individual Therapy  Jaivion Kingsley OTR/L 12/02/2020, 4:29 PM

## 2020-12-03 LAB — GLUCOSE, CAPILLARY: Glucose-Capillary: 105 mg/dL — ABNORMAL HIGH (ref 70–99)

## 2020-12-03 MED ORDER — MODAFINIL 100 MG PO TABS
200.0000 mg | ORAL_TABLET | Freq: Every day | ORAL | Status: DC
  Administered 2020-12-04 – 2020-12-06 (×3): 200 mg via ORAL
  Filled 2020-12-03 (×3): qty 2

## 2020-12-03 NOTE — Progress Notes (Signed)
Physical Therapy Weekly Progress Note  Patient Details  Name: Maria Dyer MRN: [id number] Date of Birth: 11-14-1930  Beginning of progress report period: November 26, 2020 End of progress report period: December 03, 2020  Today's Date: 12/03/2020 PT Individual Time: 1100-1200 PT Individual Time Calculation (min): 60 min   Patient has met 4 of 4 short term goals.  Pt making slow progress towards goals. She is able to complete bed mobility with minA, requires minA for sit<>stand transfers, modA for bed<>chair transfers, and is ambulating b/w 10-40f with modA and use of EHarmon Pierwalker or her 3-wheeled rollator. Gait distances are very inconsistent. She is very self limiting with significant fearfulness of falling, cognitive deficits from her dementia and CVA, receptive > expressive aphasia, and global deconditioning and weakness.  Patient continues to demonstrate the following deficits muscle weakness, decreased cardiorespiratoy endurance, unbalanced muscle activation, motor apraxia, decreased coordination, and decreased motor planning, decreased motor planning, decreased initiation, decreased attention, decreased awareness, decreased problem solving, decreased safety awareness, decreased memory, and delayed processing, and decreased standing balance, decreased postural control, hemiplegia, and decreased balance strategies and therefore will continue to benefit from skilled PT intervention to increase functional independence with mobility.  Patient progressing toward long term goals..  Continue plan of care.  PT Short Term Goals Week 1:  PT Short Term Goal 1 (Week 1): Pt will complete supine<>sit with minA PT Short Term Goal 1 - Progress (Week 1): Met PT Short Term Goal 2 (Week 1): Pt will complete sit<>stand transfers with minA PT Short Term Goal 2 - Progress (Week 1): Met PT Short Term Goal 3 (Week 1): Pt will complete bed<>chair transfers with modA PT Short Term Goal 3 - Progress (Week 1):  Met PT Short Term Goal 4 (Week 1): Pt will initiate gait training PT Short Term Goal 4 - Progress (Week 1): Met Week 2:  PT Short Term Goal 1 (Week 2): pt will complete bed mobility with CGA PT Short Term Goal 2 (Week 2): Pt will complete bed<>chair transfers with minA and LRAD PT Short Term Goal 3 (Week 2): Pt will ambulate 755fwith minA and LRAD  Skilled Therapeutic Interventions/Progress Updates:     Pt seated in w/c at start of session - nephew at bedside and present throughout session for active observation, family education, and most importantly assisting with encouraging patient to participate in mobility training. Pt wheeled to main rehab hallway in w/c for time. Setup an am char ~2551fway for visual target. Instructed pt to stand to her 3 wheeled rollator to work on gait training but pt continuously refusing, stating "I can't. I wont." And "tomorrow." Eventually, she was able to stand to rollator with minA and was able to take a few steps but this required MAX encouragement as she would frequent efforts to try and sit back her in w/c. She's obviously very fearful of falling and had difficulty trusting herself to ambulate. Unable to further work on gait trials due to high levels of anxiety and fearfulness. W/c transport to ortho rehab gym and assisted to Nustep machine with a maxA squat<>pivot transfer. Required setupA on Nustep and completed a total of 5 minutes at workload 3, using both BUE's and BLE's. Required MAX encouragement to participate in Nustep and had frequent brief rest breaks. Pt assisted back to her w/c via modA squat<>pivot transfer. Instructed her on w/c propulsion and she was able to propel herself in w/c using BUE's ~[id number] minA - demo's very poor stroke  propulsion with non symmetric strokes. Pt transported remaining distance back to her room and remained seated in w/c with safety belt alarm on, all needs in reach.   Therapy Documentation Precautions:   Precautions Precautions: Fall Precaution Comments: receptive > expressive aphasia, HoH Restrictions Weight Bearing Restrictions: No General:     Therapy/Group: Individual Therapy  Alger Simons 12/03/2020, 7:47 AM

## 2020-12-03 NOTE — [place] FL2 (Signed)
[place] MEDICAID FL2 LEVEL OF CARE SCREENING TOOL     IDENTIFICATION  Patient Name: Maria Dyer Birthdate: Sep 13, 1930 Sex: female Admission Date (Current Location): 11/25/2020  [facility] and [place] Number:  Producer, television/film/video and Address:  [facility] [place]. [facility], 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   Acute ischemic left MCA stroke (HCC) 11/19/2020   Pressure injury of skin 11/19/2020    Orientation RESPIRATION BLADDER Height & Weight     Self, Place    Continent Weight: 130 lb 11.7 oz (59.3 kg) Height:  5\' 9"  (175.3 cm)  BEHAVIORAL SYMPTOMS/MOOD NEUROLOGICAL BOWEL NUTRITION STATUS      Continent Diet (regular thin liquids)  AMBULATORY STATUS COMMUNICATION OF NEEDS Skin   Extensive Assist Non-Verbally (expressive aphasia from CVA) Normal                       Personal Care Assistance Level of Assistance  Bathing, Feeding, Dressing Bathing Assistance: Maximum assistance Feeding assistance: Limited assistance (set up intermittent supervision) Dressing Assistance: Limited assistance     Functional Limitations Info  Hearing, Speech   Hearing Info: Impaired Speech Info: Impaired    SPECIAL CARE FACTORS FREQUENCY  PT (By licensed PT), Speech therapy, OT (By licensed OT), Bowel and bladder program     PT Frequency: 5x week OT Frequency: 5x week Bowel and Bladder Program Frequency: Timed tolieting to assist with continence   Speech Therapy  Frequency: 5 x week      Contractures Contractures Info: Not present    Additional Factors Info  Code Status, Allergies Code Status Info: Full  Code Allergies Info: Fosinopril, Lisinopril and Enalapril           Current Medications (12/03/2020):  This is the current hospital active medication list Current Facility-Administered Medications  Medication Dose Route Frequency Provider Last Rate Last Admin   0.9 %  sodium chloride infusion   Intravenous Continuous Raulkar, 02/02/2021, MD 75 mL/hr at 12/03/20 1407 Infusion Verify at 12/03/20 1407   acetaminophen (TYLENOL) tablet 650 mg  650 mg Oral Q4H PRN Angiulli, 02/02/21, PA-C       Or   acetaminophen (TYLENOL) suppository 650 mg  650 mg Rectal Q4H PRN Angiulli, Mcarthur Rossetti, PA-C       amLODipine (NORVASC) tablet 5 mg  5 mg Oral Daily Raulkar, Mcarthur Rossetti, MD   5 mg at 12/03/20 02/02/21   aspirin chewable tablet 81 mg  81 mg Oral Daily 5643, PA-C   81 mg at 12/03/20 0925   atorvastatin (LIPITOR) tablet 40 mg  40 mg Oral Daily 02/02/21, PA-C   40 mg at 12/03/20 0924   fluconazole (DIFLUCAN) tablet 150 mg  150 mg Oral Weekly Hammons, Kimberly B, RPH   150 mg at 12/03/20 1412   levothyroxine (SYNTHROID) tablet 50 mcg  50 mcg Oral Daily 02/02/21, PA-C   50 mcg at 12/03/20 0531   losartan (  COZAAR) tablet 100 mg  100 mg Oral Daily Charlton Amor, PA-C   100 mg at 12/03/20 8299   megestrol (MEGACE) tablet 40 mg  40 mg Oral Daily Raulkar, Drema Pry, MD   40 mg at 12/03/20 3716   metoprolol succinate (TOPROL-XL) 24 hr tablet 50 mg  50 mg Oral Daily Charlton Amor, PA-C   50 mg at 12/03/20 9678   [START ON 12/04/2020] modafinil (PROVIGIL) tablet 200 mg  200 mg Oral Daily Raulkar, Drema Pry, MD       nutrition supplement (JUVEN) (JUVEN) powder packet 1 packet  1 packet Oral BID BM Raulkar, Drema Pry, MD   1 packet at 12/03/20 1408   ticagrelor (BRILINTA) tablet 90 mg  90 mg Oral BID Charlton Amor, PA-C   90 mg at  12/03/20 9381   Zinc Oxide (TRIPLE PASTE) 12.8 % ointment   Topical PRN Charlton Amor, PA-C   Given at 11/30/20 0005     Discharge Medications: Please see discharge summary for a list of discharge medications.  Relevant Imaging Results:  Relevant Lab Results:   Additional Information SSN: [id number] COVID vaccines  Aarnav Steagall, Lemar Livings, LCSW

## 2020-12-03 NOTE — Progress Notes (Signed)
PROGRESS NOTE   Subjective/Complaints: Ate breakfast well as per SLP- requires assistance for set up of meals AKI resolved She is asking for her glasses  ROS: impaired by cognition Objective:   No results found. Recent Labs    12/02/20 0503  WBC 6.8  HGB 10.3*  HCT 31.0*  PLT 300    Recent Labs    12/02/20 0503  NA 139  K 3.5  CL 109  CO2 19*  GLUCOSE 103*  BUN 20  CREATININE 0.94  CALCIUM 9.1     Intake/Output Summary (Last 24 hours) at 12/03/2020 1208 Last data filed at 12/03/2020 0032 Gross per 24 hour  Intake 400 ml  Output 400 ml  Net 0 ml         Physical Exam: Vital Signs Blood pressure (!) 122/45, pulse 81, temperature 97.8 F (36.6 C), resp. rate 16, height 5\' 9"  (1.753 m), weight 59.3 kg, SpO2 100 %. Gen: no distress, normal appearing HEENT: oral mucosa pink and moist, NCAT Cardio: Reg rate Chest: normal effort, normal rate of breathing Abd: soft, non-distended Ext: no edema  Psychiatric: appropriate- very HOH Neurological: Ox0-1- wasn't able to give me her name without cues  Ext: no edema Psych: pleasant, normal affect, smiling Skin: bruising on both arms Neurological/MSK    Comments: Patient is severely hard of hearing.  Patient is awake alert, eyes are open.  Receptive> expressive aphasia. She did not follow commands. ModA gait within room. Impaired cognition Supine to sit MinA Transfers to bed MinAx2   Assessment/Plan: 1. Functional deficits which require 3+ hours per day of interdisciplinary therapy in a comprehensive inpatient rehab setting. Physiatrist is providing close team supervision and 24 hour management of active medical problems listed below. Physiatrist and rehab team continue to assess barriers to discharge/monitor patient progress toward functional and medical goals  Care Tool:  Bathing    Body parts bathed by patient: Right arm, Left arm, Chest, Abdomen,  Face, Right upper leg, Left upper leg   Body parts bathed by helper: Front perineal area, Buttocks, Right lower leg, Left lower leg     Bathing assist Assist Level: 2 Helpers     Upper Body Dressing/Undressing Upper body dressing   What is the patient wearing?: Pull over shirt    Upper body assist Assist Level: Minimal Assistance - Patient > 75%    Lower Body Dressing/Undressing Lower body dressing      What is the patient wearing?: Pants, Incontinence brief     Lower body assist Assist for lower body dressing: Maximal Assistance - Patient 25 - 49%     Toileting Toileting    Toileting assist Assist for toileting: Maximal Assistance - Patient 25 - 49%     Transfers Chair/bed transfer  Transfers assist     Chair/bed transfer assist level: Maximal Assistance - Patient 25 - 49% (lateral scoot)     Locomotion Ambulation   Ambulation assist   Ambulation activity did not occur: Refused  Assist level: 2 helpers (mod A & +2 close w/c follow) Assistive device: Walker-Eva Max distance: 38ft   Walk 10 feet activity   Assist  Walk 10 feet activity did not occur:  Refused        Walk 50 feet activity   Assist Walk 50 feet with 2 turns activity did not occur: Refused         Walk 150 feet activity   Assist Walk 150 feet activity did not occur: Refused         Walk 10 feet on uneven surface  activity   Assist Walk 10 feet on uneven surfaces activity did not occur: Refused         Wheelchair     Assist Is the patient using a wheelchair?: Yes Type of Wheelchair: Manual    Wheelchair assist level: Dependent - Patient 0%      Wheelchair 50 feet with 2 turns activity    Assist        Assist Level: Dependent - Patient 0%   Wheelchair 150 feet activity     Assist      Assist Level: Dependent - Patient 0%   Blood pressure (!) 122/45, pulse 81, temperature 97.8 F (36.6 C), resp. rate 16, height 5\' 9"  (1.753 m), weight  59.3 kg, SpO2 100 %.  Medical Problem List and Plan: 1.  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  Impaired mobility: -DVT/anticoagulation:  Mechanical: Sequential compression devices, below knee Bilateral lower extremities             -antiplatelet therapy: Continue Aspirin 81 mg daily and Brilinta 90 mg twice daily 3. Pain Management: N/A 4. Mood: Provide emotional support             -antipsychotic agents: N/A 5. Neuropsych: This patient is not capable of making decisions on her own behalf. 6. Skin/Wound Care: Routine skin checks 7. Fluids/Electrolytes/Nutrition: Routine in and outs with follow-up chemistries 8.  Hyperlipidemia.  Continue Lipitor 9.  Hypothyroidism.  Continue Synthroid 10.  Hypertension.  Labile, continue Cozaar 100 mg daily, Toprol-XL 50 mg daily.  Increase amlodipine to 5mg . Monitor with increased mobility. Magnesium level reviewed and normal.  11.  MASD buttocks and groin.  Zinc oxide as directed with routine skin checks. continue diflucan per pharmacy consult.  12. AKI: Normalized. Placed nursing order to encourage 6-8 glasses of water per day. 13. Hard of hearing: Try to wear see through masks when communicating with her 14. Low protein: continue Juven supplementation.  15. Fatigue: increase Modafinil to 200mg  15. Decreased appetite: Continue Megace for appetite stimulation.   LOS: 8 days A FACE TO FACE EVALUATION WAS PERFORMED  P Yeni Jiggetts 12/03/2020, 12:08 PM

## 2020-12-03 NOTE — Progress Notes (Signed)
Speech Language Pathology Daily Session Note  Patient Details  Name: Maria Dyer MRN: [id number] Date of Birth: June 01, 1930  Today's Date: 12/03/2020 SLP Individual Time: 0805-0902 SLP Individual Time Calculation (min): 57 min  Short Term Goals: Week 2: SLP Short Term Goal 1 (Week 2): Pt will respond to yes/no questions with 70% accuracy with max A written and visual cues. SLP Short Term Goal 2 (Week 2): Pt follow identify common objects/pictures in a field of 2 with 70% accuracy with mod A written cues. SLP Short Term Goal 3 (Week 2): Pt will demonstrate self-monitoring and self-correction of verbal errors in 50% of opportunities with max A verbal cues. SLP Short Term Goal 4 (Week 2): Pt will write at word/ 2 word phrase level with 70% accuracy with mod A verbal cues. SLP Short Term Goal 5 (Week 2): Pt will demonstrate reading comprehension at 2 word phrase level by matching phrase to picture in a field 2 with 75% accuracy with mod A verbal cues.  Skilled Therapeutic Interventions:Skilled ST services focused on language skills. Pt requested " up coffee" pointing to untouched breakfast tray. SLP provided assistance in repositioning in bed and set up breakfast tray assist. Pt required min A verbal cues for problem solving navigating tray. SLP updated diet order to set up assist and intermittent supervision as well as communicated with NT. Pt continues to demonstrate poor awareness of acute deficits and orientation, even with written aid to support comprehension and several repeat trial, pt continues to stat " I don't know."  Pt was able to express wants/needs at simple phrase level with min A verbal cues due to phonetic errors. Pt was able to name items on tray with 70% accuracy (7/10 opportunities) with mod A written cues to increase comprehension with no awareness of phonetic errors. Pt was able to write names of common items only in which pt was able to name (ranging from 3-11 letters) in 6 out 8  opportunities and was able to write names of 2 unnamed items with mod A filling in missing letters. Pt was unable to return demonstration of current call bell labeled "HELP", therefore SLP switched "HELP" sign to soft touch. Pt was able to return demonstration with mod A fade to min A verbal cues in 5 trials. However Pt pressed call bell/soft touch as SLP was leaving with little awareness and intention, pt was attempting to adjust channels on TV. SLP assisted pt in selecting channels and pt responded to yes/no questions with mod A multimodal cues to confirm comprehension. Pt was left in room with call bell within reach and bed alarm set. SLP recommends to continue skilled services.     Pain Pain Assessment Pain Scale: Faces Faces Pain Scale: No hurt  Therapy/Group: Individual Therapy  Leeanna Slaby  [facility] 12/03/2020, 12:54 PM

## 2020-12-03 NOTE — Patient Care Conference (Signed)
Inpatient RehabilitationTeam Conference and Plan of Care Update Date: 12/03/2020   Time: 13:05 PM    Patient Name: Maria Dyer      Medical Record Number: [id number]  Date of Birth: 22-Jan-1931 Sex: Female         Room/Bed: 4M09C/4M09C-01 Payor Info: Payor: [employer] MEDICARE / Plan: Monia P[employer] MEDICARE HMO/PPO / Product Type: *No Product type* /    Admit Date/Time:  11/25/2020  6:19 PM  Primary Diagnosis:  Left middle cerebral artery stroke [facility])  Hospital Problems: Principal Problem:   Left middle cerebral artery stroke [facility])    Expected Discharge Date: Expected Discharge Date: 12/13/20 (SNF pending)  Team Members Present: Physician leading conference: Dr. Sula Soda Social Worker Present: Dossie Der, LCSW Nurse Present: Chana Bode, RN PT Present: Wynelle Link, PT OT Present: Perrin Maltese, OT SLP Present: Colin Benton, SLP PPS Coordinator present : Fae Pippin, SLP     Current Status/Progress Goal Weekly Team Focus  Bowel/Bladder             Swallow/Nutrition/ Hydration             ADL's   Mod assist for selfcare tasks sit to stand.  Mod to max for stand pivot transfers with use of the RW.  Global aphasia with increased fear of falling.  Total assist for toleting tasks. Needs max encouragement to participate.  min assist overall  selfcare retraining, transfer training, Neuromuscular re-education, balance retraining, cognitive retraining,   Mobility   modA bed mobility, minA sit<>stand transfers, modA short distance gait. Mobility limited by fearfulness of falling and global deconditioning  minA  Functional transfers, continuing gait training, improving participation, family involvement, DC planning   Communication   max A receptive, mod A expressive  Sup-to-minA  expressing wants/needs with multimodal communication, yes/no questions, object naming, writting, reading   Safety/Cognition/ Behavioral Observations  ST continuing to focus on communication deficits   min A error awareness  error awareness   Pain             Skin               Discharge Planning:  Familyt loking into ALF and hopeful she can go there upon DC. Facility will need to come and eval to see if can meet her needs at DC   Team Discussion: Patient with expressive aphasia and self limiting behaviors. Note a fear of falling and poor tolerance to therapy sessions; refuses sessions. MD addressed appetite with megace and added stimulant to promote participation. AKI resolved.   Patient on target to meet rehab goals: no, currently mod assist for bed mobility and min - mod assist for sit to stand. Requires mod assist for self care and mod - max for stand pivot transfers but can ambulate short distances with mod assist.  Limited by poor awareness, disorientation, poor error awareness, motor planning deficits. Patient requires visual aides for new data and to help with carry over of information. Patient is able to express self needs as a phrases level and write at a word level.  *See Care Plan and progress notes for long and short-term goals.   Revisions to Treatment Plan:  Working on  basic problem solving, self feeding, communication, awareness and carry over  Downgraded goals for discharge from min assist  Teaching Needs: Medications, skin care, transfers, safety, etc  Current Barriers to Discharge: Home enviroment access/layout, Lack of/limited family support, and Behavior  Possible Resolutions to Barriers: Recommend SNF     Medical  Summary Current Status: decreased appetite, acute kidney injury, severe receptive>expressive aphasia, apraxia, fatigue, fear of falling  Barriers to Discharge: Medical stability  Barriers to Discharge Comments: decreased appetite, acute kidney injury, severe receptive>expressive aphasia, apraxia, fatigue, fear of falling Possible Resolutions to [employer] Focus: continue megace, continue to encourage hydration, increase modafinil to 200mg ,  AKI improved with fluids- continue to monitor creatinine   Continued Need for Acute Rehabilitation Level of Care: The patient requires daily medical management by a physician with specialized training in physical medicine and rehabilitation for the following reasons: Direction of a multidisciplinary physical rehabilitation program to maximize functional independence : Yes Medical management of patient stability for increased activity during participation in an intensive rehabilitation regime.: Yes Analysis of laboratory values and/or radiology reports with any subsequent need for medication adjustment and/or medical intervention. : Yes   I attest that I was present, lead the team conference, and concur with the assessment and plan of the team.   B 12/03/2020, 3:22 PM

## 2020-12-03 NOTE — Progress Notes (Signed)
Patient ID: Maria Dyer, female   DOB: 11/24/1930, 87 y.o.   MRN: [id number] Spoke with Danielle-great niece to discuss team conference progress and goals of min assist. She is aware of the difficulty with pt's fear of falling and participating in therapies. Family members have been present with this also. They are aware she will not be ALF level at discharge from here and will need physical assist more than what a ALF can provide. She would like to pursue [facility] for pt and go from there. She is aware pt will be private pay and this is a plus for her. Will work on this and see if either has an available bed. Aware target discharge date if 9/16, but may be sooner if bed available.

## 2020-12-03 NOTE — Progress Notes (Signed)
Occupational Therapy Weekly Progress Note  Patient Details  Name: Maria Dyer MRN: [id number] Date of Birth: 12/06/1930  Beginning of progress report period: November 16, 2020 End of progress report period: December 03, 2020  Today's Date: 12/03/2020    Patient has met 3 of 3 short term goals. Pt currently requires MAX A for LB ADLS and UB ADLS, MIN A for bed mobility, and MIN for toilet transfers via stand pivot from w/c with pt holding onto grab bars. Pt requires MAX A for 3/3 toileting tasks. Pt completes seated grooming tasks with set- up assist needing cues for sequencing such as remembering to apply toothpaste to brush. Pt can stand from w/c while holding onto bed rail with MIN A- min guard but requires max encouragement, pt does not like to use the RW. Pts family have been present during sessions and pt responds best to their encouragement as pt is affected by repetitive aphasia repeating words such as "tomorrow" or "I can't" and benefits from families familiar faces and encouragement.   Patient continues to demonstrate the following deficits: muscle weakness and language impairements, decreased cardiorespiratoy endurance, unbalanced muscle activation, decreased coordination, and decreased motor planning, decreased problem solving, decreased safety awareness, and delayed processing, and decreased sitting balance, decreased standing balance, and decreased balance strategies and therefore will continue to benefit from skilled OT intervention to enhance overall performance with BADL and Reduce care partner burden.  Patient progressing toward long term goals..  Continue plan of care.  OT Short Term Goals Week 1:  OT Short Term Goal 1 (Week 1): Pt will don LB clothing with max A OT Short Term Goal 1 - Progress (Week 1): Met OT Short Term Goal 2 (Week 1): Pt will complete sit > stand with mod A OT Short Term Goal 2 - Progress (Week 1): Met OT Short Term Goal 3 (Week 1): Pt will complete stand  pivot with mod A OT Short Term Goal 3 - Progress (Week 1): Met Week 2:  OT Short Term Goal 1 (Week 2): pt will stand with unilateral support for ~ 30 seconds as precursor to LB ADLS OT Short Term Goal 2 (Week 2): pt will complete 2/3 toileting tasks with MOD A OT Short Term Goal 3 (Week 2): pt will don LB clothing with MOD A    Therapy Documentation Precautions:  Precautions Precautions: Fall Precaution Comments: receptive > expressive aphasia, HoH Restrictions Weight Bearing Restrictions: No General:   Vital Signs: Therapy Vitals Temp: 97.6 F (36.4 C) Temp Source: Oral Pulse Rate: 81 Resp: 15 BP: (!) 132/54 Patient Position (if appropriate): Lying Oxygen Therapy SpO2: 100 % O2 Device: Room Air    Therapy/Group: Individual Therapy  [name] 12/03/2020, 1:59 PM

## 2020-12-03 NOTE — Progress Notes (Signed)
Occupational Therapy Session Note  Patient Details  Name: Maria Dyer MRN: [id number] Date of Birth: [birth year]  Today's Date: 12/03/2020 OT Individual Time: 1000-1030 OT Individual Time Calculation (min): 30 min    Short Term Goals: Week 1:  OT Short Term Goal 1 (Week 1): Pt will don LB clothing with max A OT Short Term Goal 2 (Week 1): Pt will complete sit > stand with mod A OT Short Term Goal 3 (Week 1): Pt will complete stand pivot with mod A  Skilled Therapeutic Interventions/Progress Updates:  Pt greeted supine in bed  agreeable to OT intervention with max encouragement. Pt continues to present with aphasia needing increased time and effort to follow commands and MAX multimodal cues. Pt currently required [facility] for bed mobility needing most assist to elevate trunk into sitting. Pt required MAX A to don shorts from EOB and MAX A to don footwear. Pt insistent on not needing to pull pants up to waist line, stating "they're fine, they're fine." Pt needed  multiple attempts and MOD A to stand while NT pulled pants up to waist line as pt with difficulty extending trunk into full stand and not wanting to stand from EOB. Pt required MOD A for squat pivot transfer from EOB>w/c. Pt left seated in w/c with alarm belt activated and all needs within reach.                    Therapy Documentation Precautions:  Precautions Precautions: Fall Precaution Comments: receptive > expressive aphasia, HoH Restrictions Weight Bearing Restrictions: No  Pain: pt reports pain in feet when donning shoes, allowed increased time and repositioned shoes until pt indicated comfort in shoes.     Therapy/Group: Individual Therapy  Adlee Paar [facility] 12/03/2020, 12:23 PM

## 2020-12-03 NOTE — Progress Notes (Signed)
Occupational Therapy Session Note  Patient Details  Name: Maria Dyer MRN: [id number] Date of Birth: Aug 05, 1930  Today's Date: 12/03/2020 OT Individual Time: 1302-1400 OT Individual Time Calculation (min): 58 min    Short Term Goals: Week 1:  OT Short Term Goal 1 (Week 1): Pt will don LB clothing with max A OT Short Term Goal 1 - Progress (Week 1): Met OT Short Term Goal 2 (Week 1): Pt will complete sit > stand with mod A OT Short Term Goal 2 - Progress (Week 1): Met OT Short Term Goal 3 (Week 1): Pt will complete stand pivot with mod A OT Short Term Goal 3 - Progress (Week 1): Met Week 2:  OT Short Term Goal 1 (Week 2): pt will stand with unilateral support for ~ 30 seconds as precursor to LB ADLS OT Short Term Goal 2 (Week 2): pt will complete 2/3 toileting tasks with MOD A OT Short Term Goal 3 (Week 2): pt will don LB clothing with MOD A  Skilled Therapeutic Interventions/Progress Updates:  Pt greeted seated in w/c with pts niece present. Pt agreeable to OT intervention. Session focus on functional tranfers and BADL reeducation. Pt completed multiple sit<>stands from w/c with pt needing max encouragement from this OTA and pts niece. Pt doesn't prefer the RW as pt with fear of falling ( did switch out RW in room to pediatric RW with pt still declining to use), pt responded best when using bed rail to sit<>stand from w/c. Pt completed x10 reps sit<>stands with MIN A - MIN guard assist however when pt ready to sit, pt adamantly pushes back and states "I can't, I can't." Pt was able to dynamically reach for nieces hands with R and L UE with max encouragement. Attempted to have pt reach for niece to give her a hug as precursor for LB ADLS however pt too fearful of falling at this time to completed dynamic BUE tasks. Pt completed toilet transfer via stand pivot transfer from w/c >toilet with pt using grab bars with MIN A. Pt required MAX A for 3/3 toileting tasks as pt forgetting to pull pants  down or up when toileting. Pt required MAX A to don new brief d/t incontinence. Pt transported to sink for grooming with pt able to wash her hands with set- up assist and complete oral care with set- up assist needing cues to sequence task such as remembering to apply toothpaste. Pt completed stand pivot transfer from w/c>EOB with MIN HHA. Pt returned to supine with CGA. Pt left supine in bed with bed alarm activated and all needs within reach.  Therapy Documentation Precautions:  Precautions Precautions: Fall Precaution Comments: receptive > expressive aphasia, HoH Restrictions Weight Bearing Restrictions: No  Pain: Pt with no indications of pain during session.    Therapy/Group: Individual Therapy  Cloe Sockwell 12/03/2020, 3:40 PM

## 2020-12-04 LAB — GLUCOSE, CAPILLARY: Glucose-Capillary: 102 mg/dL — ABNORMAL HIGH (ref 70–99)

## 2020-12-04 MED ORDER — SENNOSIDES-DOCUSATE SODIUM 8.6-50 MG PO TABS
1.0000 | ORAL_TABLET | Freq: Two times a day (BID) | ORAL | Status: DC
  Administered 2020-12-04 – 2020-12-07 (×7): 1 via ORAL
  Filled 2020-12-04 (×7): qty 1

## 2020-12-04 MED ORDER — POLYETHYLENE GLYCOL 3350 17 G PO PACK
17.0000 g | PACK | Freq: Every day | ORAL | Status: DC
  Administered 2020-12-04 – 2020-12-07 (×4): 17 g via ORAL
  Filled 2020-12-04 (×4): qty 1

## 2020-12-04 NOTE — Progress Notes (Signed)
Occupational Therapy Session Note  Patient Details  Name: Maria Dyer MRN: [id number] Date of Birth: 05-02-1930  Today's Date: 12/04/2020 OT Individual Time: 1445-1550 OT Individual Time Calculation (min): 65 min  and Today's Date: 12/04/2020 OT Missed Time: 10 Minutes Missed Time Reason: Patient unwilling/refused to participate without medical reason   Short Term Goals: Week 1:  OT Short Term Goal 1 (Week 1): Pt will don LB clothing with max A OT Short Term Goal 1 - Progress (Week 1): Met OT Short Term Goal 2 (Week 1): Pt will complete sit > stand with mod A OT Short Term Goal 2 - Progress (Week 1): Met OT Short Term Goal 3 (Week 1): Pt will complete stand pivot with mod A OT Short Term Goal 3 - Progress (Week 1): Met  Skilled Therapeutic Interventions/Progress Updates:     Pt received in w/c with no pain reported, but overall EXTREME fear of falling and resistance to any mobility pt often stating, "no. I cant. I wont." Despite multiple options of assistive aides. Pt with significant expressive and receptive deficits impacting pt's understanding of therapy purposes. Pt completes seated ping pong ball puzzle to work on, requiring MAX A for problem solving arranging colors to match picture. Pt then able to sort game piece tiles into categories based on color and then using same tiles based on shape with tiles placed in mod ranges outside BOS to improve anterior weigth shift in prep for mobility. Pt adapmently refusing to use AD despite multiple attempts and different locations in tx gym. Pt eventually ambulates MAX A +2 HHA to EOB with reciprocal stepping noted and pt able to sit>suping with bed rail sand CGA.   Pt left at end of session in bed with exit alarm on, call light in reach and all needs met   Therapy Documentation Precautions:  Precautions Precautions: Fall Precaution Comments: receptive > expressive aphasia, HoH Restrictions Weight Bearing Restrictions: No General:    Vital Signs: Therapy Vitals Temp: 99.2 F (37.3 C) Temp Source: Oral Pulse Rate: 77 Resp: 18 BP: (!) 144/54 Patient Position (if appropriate): Lying Oxygen Therapy SpO2: 100 % O2 Device: Room Air Pain:   ADL: ADL Eating: Supervision/safety Where Assessed-Eating: Other (comment) Grooming: Minimal assistance Where Assessed-Grooming: Sitting at sink Upper Body Bathing: Minimal assistance Where Assessed-Upper Body Bathing: Sitting at sink, Wheelchair Lower Body Bathing: Dependent Where Assessed-Lower Body Bathing: Sitting at sink Upper Body Dressing: Minimal assistance Where Assessed-Upper Body Dressing: Sitting at sink, Wheelchair Lower Body Dressing: Dependent Where Assessed-Lower Body Dressing: Standing at sink Toileting: Dependent Where Assessed-Toileting: Bedside Commode Toilet Transfer: Maximal verbal cueing, Dependent Toilet Transfer Method: (P) Stand pivot Toilet Transfer Equipment: Bedside commode Tub/Shower Transfer: Unable to assess Social research officer, government: Unable to assess Vision   Perception    Praxis   Exercises:   Other Treatments:     Therapy/Group: Individual Therapy  Tonny Branch 12/04/2020, 6:55 AM

## 2020-12-04 NOTE — Progress Notes (Addendum)
Speech Language Pathology Daily Session Note  Patient Details  Name: Maria Dyer MRN: [id number] Date of Birth: 1930/06/24  Today's Date: 12/04/2020 SLP Individual Time: 0905-1000 SLP Individual Time Calculation (min): 55 min  Short Term Goals: Week 2: SLP Short Term Goal 1 (Week 2): Pt will respond to yes/no questions with 70% accuracy with max A written and visual cues. SLP Short Term Goal 2 (Week 2): Pt follow identify common objects/pictures in a field of 2 with 70% accuracy with mod A written cues. SLP Short Term Goal 3 (Week 2): Pt will demonstrate intellectual awareness skills in response to yes/no questions to acute deficits with max A multimodal cues. SLP Short Term Goal 4 (Week 2): Pt will write at word/ 2 word phrase level with 70% accuracy with mod A verbal cues. SLP Short Term Goal 5 (Week 2): Pt will demonstrate reading comprehension at 2 word phrase level by matching phrase to picture in a field 2 with 75% accuracy with mod A verbal cues. SLP Short Term Goal 6 (Week 2): Pt will demonstrate recall of orientation information (place and situation) in response to yes/no questions with 50% accuracy given max A multimodal cues.  Skilled Therapeutic Interventions:Skilled ST services focused on cognitive skills. Pt was attempting to open orange juice upon entering room, SLP assisted. Pt is continuing to demonstrate little to no carryover for use of call bell/soft touch labeled "HELP" nor orientation. Pt was able to return demonstration of call bell with written cues, however at the end of the session was attempting to press the call bell to change the TV channels. Pt was able to answer place and situation orientation questions with yes/no response, with max A written cues with 30% accuracy. Pt demonstrated poor carryover and preservation on missing family/friends throughout the session, pointing to pictures on digital picture frame. Pt required increased encouragement to participate in  services today compared to previous session with SLP, likely impacted by poor intellectual awareness. Pt demonstrated increase expressive and receptive language skills in unstructured familiar tasks verse structures tasks, such as ability to brush teeth with set up assist and min A verbal cues for problem solving, but required max A verbal cues to follow directions with same objects as well as unable to name objects. Pt was able to name 50% personal food items in room with max A verbal cues. SLP adjusted focus to cognitive verse language skills due to poor carryover and limited sustained attention. Pt was able to name common objects in room with 50% accuracy with max A written cues for comprehension, named coins with 100% accuracy but only able to name numerical amount for each coin when given 2 written choices. Pt was able to sort coins once areas were labeled with name requiring max A fading to min A verbal cues for problem solving if the coins were given 1 at a time and sustained attention redirected at 1 minute intervals. SLP posted basic signs in room for place, situation and time in which pt was able to read with max A ( could read "stroke" and pointed to today's date.) SLP will focus on orientation, recall and basic problem solving in hopes of improving intellectual awareness along with continued language goals. Goals have been adjusted and downgraded due to short ELOS and slow progress. Pt was left in room with call bell within reach and bed alarm set. SLP recommends to continue skilled services.     Pain Pain Assessment Pain Score: 0-No pain  Therapy/Group: Individual Therapy  Shamond Skelton  [facility] 12/04/2020, 12:58 PM

## 2020-12-04 NOTE — Progress Notes (Signed)
Physical Therapy Session Note  Patient Details  Name: Maria Dyer MRN: [id number] Date of Birth: 10-02-1930  Today's Date: 12/04/2020 PT Individual Time: 1000-1100 PT Individual Time Calculation (min): 60 min   Short Term Goals: Week 2:  PT Short Term Goal 1 (Week 2): pt will complete bed mobility with CGA PT Short Term Goal 2 (Week 2): Pt will complete bed<>chair transfers with minA and LRAD PT Short Term Goal 3 (Week 2): Pt will ambulate 32f with minA and LRAD  Skilled Therapeutic Interventions/Progress Updates:    Pt supine in bed to start - needs convincing to participate to therapy. Continues to report "I can't, please." Or provide other excuses to not put forth effort in therapy. Began to don shorts at bed level however pt noted to have very saturated brief full of urine from incontinence episode. TotalA for brief change and pt able to complete pericare with setupA. Donned new brief with totalA and then resumed donning pants and minA level for just threading her LE's - otherwise was able ot pull them over hips via bridging technique. Pt completed supine<>sit with HOB flat, requiring minA for trunk elevation. Able to sit unsupported with supervision and completed squat<>pivot transfer with modA from EOB to w/c - noted motor planning deficits with this. Pt continuing to refuse to attempt to work on any standing tasks - therefore, wheeled outdoors in w/c to work on seated there-ex where she completed the following: -2x20 bicep curls with 3# dumbbells -2x20 front punches with 3# dumbbells -2x20 overhead press, unweighted -2x20 LAQ -2x20 hip marches *Extended rest breaks needed b/w sets due to fatigue and for encouragement in participating  Pt returned upstairs back to her room in w/c. Remained seated in w/c with safety belt alarm on, soft call bell in reach, and all needs met.   Therapy Documentation Precautions:  Precautions Precautions: Fall Precaution Comments: receptive >  expressive aphasia, HoH Restrictions Weight Bearing Restrictions: No General:     Therapy/Group: Individual Therapy  Ravleen Ries P Ronin Crager PT 12/04/2020, 7:38 AM

## 2020-12-04 NOTE — Progress Notes (Signed)
PROGRESS NOTE   Subjective/Complaints: She ate about half of her breakfast this morning She has no complaints  ROS: impaired by cognition  Objective:   No results found. Recent Labs    12/02/20 0503  WBC 6.8  HGB 10.3*  HCT 31.0*  PLT 300    Recent Labs    12/02/20 0503  NA 139  K 3.5  CL 109  CO2 19*  GLUCOSE 103*  BUN 20  CREATININE 0.94  CALCIUM 9.1     Intake/Output Summary (Last 24 hours) at 12/04/2020 0946 Last data filed at 12/04/2020 0905 Gross per 24 hour  Intake 3535.04 ml  Output --  Net 3535.04 ml         Physical Exam: Vital Signs Blood pressure (!) 154/54, pulse 76, temperature 99.2 F (37.3 C), temperature source Oral, resp. rate 18, height 5\' 9"  (1.753 m), weight 59.3 kg, SpO2 100 %. Gen: no distress, normal appearing HEENT: oral mucosa pink and moist, NCAT Cardio: Reg rate Chest: normal effort, normal rate of breathing Abd: soft, non-distended Ext: no edema  Psychiatric: appropriate- very HOH Neurological: Ox0-1- wasn't able to give me her name without cues  Ext: no edema Psych: pleasant, normal affect, smiling Skin: bruising on both arms Neurological/MSK    Comments: Patient is severely hard of hearing.  Patient is awake alert, eyes are open.  Receptive> expressive aphasia. She did not follow commands. ModA gait within room. Impaired cognition Supine to sit MinA Transfers to bed MinAx2   Assessment/Plan: 1. Functional deficits which require 3+ hours per day of interdisciplinary therapy in a comprehensive inpatient rehab setting. Physiatrist is providing close team supervision and 24 hour management of active medical problems listed below. Physiatrist and rehab team continue to assess barriers to discharge/monitor patient progress toward functional and medical goals  Care Tool:  Bathing    Body parts bathed by patient: Right arm, Left arm, Chest, Abdomen, Face, Right  upper leg, Left upper leg   Body parts bathed by helper: Front perineal area, Buttocks, Right lower leg, Left lower leg     Bathing assist Assist Level: 2 Helpers     Upper Body Dressing/Undressing Upper body dressing   What is the patient wearing?: Pull over shirt    Upper body assist Assist Level: Minimal Assistance - Patient > 75%    Lower Body Dressing/Undressing Lower body dressing      What is the patient wearing?: Pants     Lower body assist Assist for lower body dressing: Maximal Assistance - Patient 25 - 49%     Toileting Toileting    Toileting assist Assist for toileting: Maximal Assistance - Patient 25 - 49%     Transfers Chair/bed transfer  Transfers assist     Chair/bed transfer assist level: Minimal Assistance - Patient > 75%     Locomotion Ambulation   Ambulation assist   Ambulation activity did not occur: Refused  Assist level: 2 helpers (mod A & +2 close w/c follow) Assistive device: Walker-Eva Max distance: 36ft   Walk 10 feet activity   Assist  Walk 10 feet activity did not occur: Refused        Walk 50  feet activity   Assist Walk 50 feet with 2 turns activity did not occur: Refused         Walk 150 feet activity   Assist Walk 150 feet activity did not occur: Refused         Walk 10 feet on uneven surface  activity   Assist Walk 10 feet on uneven surfaces activity did not occur: Refused         Wheelchair     Assist Is the patient using a wheelchair?: Yes Type of Wheelchair: Manual    Wheelchair assist level: Dependent - Patient 0%      Wheelchair 50 feet with 2 turns activity    Assist        Assist Level: Dependent - Patient 0%   Wheelchair 150 feet activity     Assist      Assist Level: Dependent - Patient 0%   Blood pressure (!) 154/54, pulse 76, temperature 99.2 F (37.3 C), temperature source Oral, resp. rate 18, height 5\' 9"  (1.753 m), weight 59.3 kg, SpO2 100  %.  Medical Problem List and Plan: 1.  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  Impaired mobility: -DVT/anticoagulation:  Mechanical: Sequential compression devices, below knee Bilateral lower extremities             -antiplatelet therapy: Continue Aspirin 81 mg daily and Brilinta 90 mg twice daily 3. Pain Management: N/A 4. Mood: Provide emotional support             -antipsychotic agents: N/A 5. Neuropsych: This patient is not capable of making decisions on her own behalf. 6. Skin/Wound Care: Routine skin checks 7. Fluids/Electrolytes/Nutrition: Routine in and outs with follow-up chemistries 8.  Hyperlipidemia.  Continue Lipitor 9.  Hypothyroidism.  Continue Synthroid 10.  Hypertension.  Labile, continue Cozaar 100 mg daily, Toprol-XL 50 mg daily.  Increase amlodipine to 5mg . Monitor with increased mobility. Magnesium level reviewed and normal.  11.  MASD buttocks and groin.  Zinc oxide as directed with routine skin checks. continue diflucan per pharmacy consult.  12. AKI: Normalized. Placed nursing order to encourage 6-8 glasses of water per day. 13. Hard of hearing: Try to wear see through masks when communicating with her 14. Low protein: continue Juven supplementation.  15. Fatigue: increase Modafinil to 200mg  15. Decreased appetite: Continue Megace for appetite stimulation.   LOS: 9 days A FACE TO FACE EVALUATION WAS PERFORMED  Kadian Barcellos 12/04/2020, 9:46 AM

## 2020-12-05 LAB — BASIC METABOLIC PANEL
Anion gap: 5 (ref 5–15)
BUN: 18 mg/dL (ref 8–23)
CO2: 23 mmol/L (ref 22–32)
Calcium: 8.4 mg/dL — ABNORMAL LOW (ref 8.9–10.3)
Chloride: 110 mmol/L (ref 98–111)
Creatinine, Ser: 0.93 mg/dL (ref 0.44–1.00)
GFR, Estimated: 59 mL/min — ABNORMAL LOW (ref >60)
Glucose, Bld: 81 mg/dL (ref 70–99)
Potassium: 3.9 mmol/L (ref 3.5–5.1)
Sodium: 138 mmol/L (ref 135–145)

## 2020-12-05 NOTE — Progress Notes (Signed)
Speech Language Pathology Daily Session Note  Patient Details  Name: Maria Dyer MRN: [id number] Date of Birth: Apr 16, 1930  Today's Date: 12/05/2020 SLP Individual Time: 0905-1000 SLP Individual Time Calculation (min): 55 min  Short Term Goals: Week 2: SLP Short Term Goal 1 (Week 2): Pt will respond to yes/no questions with 70% accuracy with max A written and visual cues. SLP Short Term Goal 2 (Week 2): Pt follow identify common objects/pictures in a field of 2 with 70% accuracy with mod A written cues. SLP Short Term Goal 3 (Week 2): Pt will demonstrate intellectual awareness skills in response to yes/no questions to acute deficits with max A multimodal cues. SLP Short Term Goal 4 (Week 2): Pt will write at word/ 2 word phrase level with 70% accuracy with mod A verbal cues. SLP Short Term Goal 5 (Week 2): Pt will demonstrate reading comprehension at 2 word phrase level by matching phrase to picture in a field 2 with 75% accuracy with mod A verbal cues. SLP Short Term Goal 6 (Week 2): Pt will demonstrate recall of orientation information (place and situation) in response to yes/no questions with 50% accuracy given max A multimodal cues.  Skilled Therapeutic Interventions:   Patient seen for skilled ST with grandnephew present in room. Patient was awake and alert, sitting in WC. SLP wore clear mask but also wrote questions/comments on dry erase board. She named object photos with 90% accuracy and verb/action photos with 80% accuracy(significant improvement). SLP observed her to initiate questions and comments with family member several times during session. When looking at family photos, she had significant difficulty naming people but was able to describe what was happening in photos with min-modA cues. Patient continues with difficulty answering open-ended questions (What, Where, etc) and benefits from [facility] for rephrasing. SLP observed that when patient did not immediately know answer to  question she would say "I don't know" and would not attempt further. Patient continues to benefit from skilled SLP intervention to maximize cognitive-linguistic function prior to discharge.  Pain Pain Assessment Pain Scale: Faces Faces Pain Scale: No hurt  Therapy/Group: Individual Therapy  Angela Nevin, MA, CCC-SLP Speech Therapy

## 2020-12-05 NOTE — Plan of Care (Signed)
  Problem: RH Stairs Goal: LTG Patient will ambulate up and down stairs w/assist (PT) Description: LTG: Patient will ambulate up and down # of stairs with assistance (PT) Outcome: Not Applicable Note: DC goal - no longer appropriate as plan for DC to SNF   Problem: Sit to Stand Goal: LTG:  Patient will perform sit to stand with assistance level (PT) Description: LTG:  Patient will perform sit to stand with assistance level (PT) Flowsheets (Taken 12/05/2020 0843) LTG: PT will perform sit to stand in preparation for functional mobility with assistance level: Minimal Assistance - Patient > 75% Note: Downgraded due to slow progress   Problem: RH Bed Mobility Goal: LTG Patient will perform bed mobility with assist (PT) Description: LTG: Patient will perform bed mobility with assistance, with/without cues (PT). Flowsheets (Taken 12/05/2020 0843) LTG: Pt will perform bed mobility with assistance level of: Minimal Assistance - Patient > 75% Note: Downgraded due to slow progress   Problem: RH Ambulation Goal: LTG Patient will ambulate in controlled environment (PT) Description: LTG: Patient will ambulate in a controlled environment, # of feet with assistance (PT). Flowsheets (Taken 12/05/2020 0843) LTG: Pt will ambulate in controlled environ  assist needed:: Minimal Assistance - Patient > 75% LTG: Ambulation distance in controlled environment: 41ft Note: Downgraded due to slow progress Goal: LTG Patient will ambulate in home environment (PT) Description: LTG: Patient will ambulate in home environment, # of feet with assistance (PT). Flowsheets (Taken 12/05/2020 0843) LTG: Pt will ambulate in home environ  assist needed:: Minimal Assistance - Patient > 75% LTG: Ambulation distance in home environment: 70ft Note: Downgraded due to slow progress   Problem: RH Wheelchair Mobility Goal: LTG Patient will propel w/c in controlled environment (PT) Description: LTG: Patient will propel wheelchair in  controlled environment, # of feet with assist (PT) Flowsheets (Taken 12/05/2020 0844) LTG: Pt will propel w/c in controlled environ  assist needed:: Minimal Assistance - Patient > 75% LTG: Propel w/c distance in controlled environment: 68ft

## 2020-12-05 NOTE — Progress Notes (Signed)
Physical Therapy Session Note  Patient Details  Name: Maria Dyer MRN: [id number] Date of Birth: 1930-11-18  Today's Date: 12/05/2020 PT Individual Time: 1415-1430 PT Individual Time Calculation (min): 15 min  Missed time: 60 min. Short Term Goals: Week 2:  PT Short Term Goal 1 (Week 2): pt will complete bed mobility with CGA PT Short Term Goal 2 (Week 2): Pt will complete bed<>chair transfers with minA and LRAD PT Short Term Goal 3 (Week 2): Pt will ambulate 36ft with minA and LRAD  Skilled Therapeutic Interventions/Progress Updates: Pt presents supine in bed and perseverating on news story on TV.  Pt then continues to refuse therapy "I'll be fine" when she sees RW being brought to side of bed by PT.  Pt encouraged to participate but holds covers up and pleasantly refuses even w/ encouragement.  Pt remains in bed w/ bed alarm on and all needs in reach.     Therapy Documentation Precautions:  Precautions Precautions: Fall Precaution Comments: receptive > expressive aphasia, HoH Restrictions Weight Bearing Restrictions: No General: PT Amount of Missed Time (min): 60 Minutes PT Missed Treatment Reason: Patient unwilling to participate Vital Signs: Therapy Vitals Temp: 98.2 F (36.8 C) Pulse Rate: 76 Resp: 20 BP: (!) 123/48 Patient Position (if appropriate): Lying Oxygen Therapy SpO2: 100 % O2 Device: Room Air Pain: appears pain-free. Pain Assessment Pain Scale: Faces Faces Pain Scale: No hurt Mobility:     Therapy/Group: Individual Therapy  Lucio Edward 12/05/2020, 3:23 PM

## 2020-12-05 NOTE — Progress Notes (Signed)
Occupational Therapy Session Note  Patient Details  Name: SEMIAH KONCZAL MRN: [id number] Date of Birth: 1930/11/03  Today's Date: 12/05/2020 OT Individual Time: 8768-1157 OT Individual Time Calculation (min): 54 min    Short Term Goals: Week 1:  OT Short Term Goal 1 (Week 1): Pt will don LB clothing with max A OT Short Term Goal 1 - Progress (Week 1): Met OT Short Term Goal 2 (Week 1): Pt will complete sit > stand with mod A OT Short Term Goal 2 - Progress (Week 1): Met OT Short Term Goal 3 (Week 1): Pt will complete stand pivot with mod A OT Short Term Goal 3 - Progress (Week 1): Met Week 2:  OT Short Term Goal 1 (Week 2): pt will stand with unilateral support for ~ 30 seconds as precursor to LB ADLS OT Short Term Goal 2 (Week 2): pt will complete 2/3 toileting tasks with MOD A OT Short Term Goal 3 (Week 2): pt will don LB clothing with MOD A  Skilled Therapeutic Interventions/Progress Updates:  Pt received supine in bed, asleep but easily awoken and agreeable to OT. Pointed to the green smiley to indicate "no pain". Supine > EOB completed with Min A. Pt said she had to pee, and required min-mod A to pivot to [facility]. Unable to void on BSC. Skin breakdown noted on anterior/posterior side of peri-area. Protective sacral patch & triple paste applied to sacral area. NT/RN notified and powder applied on anterior groin. Strong retropulsion in standing, anxiety and fear of falling present with pt stating "I can't" multiple times. Stated "hurt" several times throughout session and said "sore throat". Discussed with RN about indications of sore throat + neck pain. Stand>pivot from [facility] to wc with mod A and VC for hand placement. Focused on oral hygiene sink level in wc to increase activity tolerance, functional reach, and crossing midline to facilitate trunk control. Did not follow commands and swallowed toothpaste. Min A required for handling of toothbrush & toothpaste.Poor visual perception noted at sink  when attempting to turn faucet on/off.  Grooming tasks completed at sink level seated in wc with max directional cuing for sequencing. Wc positioned further from sink to focus on anterior weight shift and trunk control. Doffed shirt with min A and was able to bathe UB with min A. Total A required for LB peri-care. Performed LB dressing demonstrating ability to thread one leg distally through pants. OT assisted with other leg for time management, and pt able to sit>stand using sink for support with mod A. Tolerated standing at sink while peri-care was performed for ~45 seconds. Positioned in wc with breakfast tray setup for encouragement of PO intake, pt began to eat breakfast with distractions minimized on meal tray and items setup. Pt left seated in wc with breakfast, brakes locked and alarm belt on with call bell nearby.     Vitals taken seated in wc: HR: 87 O2: 99% BP: 133/59  Therapy Documentation Precautions:  Precautions Precautions: Fall Precaution Comments: receptive > expressive aphasia, HoH Restrictions Weight Bearing Restrictions: No     Therapy/Group: Individual Therapy  Macy Polio 12/05/2020, 7:09 AM

## 2020-12-05 NOTE — NC FL2 (Signed)
[place] MEDICAID FL2 LEVEL OF CARE SCREENING TOOL     IDENTIFICATION  Patient Name: Maria Dyer Birthdate: 10/18/1930 Sex: female Admission Date (Current Location): 11/25/2020  [facility] and [place] Number:  Producer, television/film/video and Address:  [facility], [address] [place], [place] [address]      Provider Number: [id number]  Attending Physician Name and Address:  Horton Chin, MD  Relative Name and Phone Number:  Tera Helper [phone]    Current Level of Care: Other (Comment) (Rehab) Recommended Level of Care: Assisted Living Facility Prior Approval Number:    Date Approved/Denied:   PASRR Number: [id number]  Discharge Plan: Other (Comment) (Assisted Living Facility)    Current Diagnoses: Patient Active Problem List   Diagnosis Date Noted   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   Acute ischemic left MCA stroke (HCC) 11/19/2020   Pressure injury of skin 11/19/2020    Orientation RESPIRATION BLADDER Height & Weight     Self, Place, Situation  Normal Continent Weight: 130 lb 11.7 oz (59.3 kg) Height:  5\' 9"  (175.3 cm)  BEHAVIORAL SYMPTOMS/MOOD NEUROLOGICAL BOWEL NUTRITION STATUS      Continent Diet (Regular thin liquids)  AMBULATORY STATUS COMMUNICATION OF NEEDS Skin   Extensive Assist Non-Verbally (attempts to make her needs known) Normal                       Personal Care Assistance Level of Assistance  Bathing, Feeding, Dressing (set up for meals intermittent supervision needed when eating) Bathing Assistance: Limited assistance Feeding assistance: Limited assistance Dressing Assistance: Limited assistance     Functional Limitations Info  Hearing, Speech   Hearing Info: Impaired Speech Info: Impaired    SPECIAL CARE FACTORS FREQUENCY  PT (By licensed PT), OT (By licensed OT), Speech therapy     PT Frequency: 3x week OT Frequency: 3x week Bowel and Bladder Program Frequency:  Timed tolieting every 3 hours   Speech Therapy Frequency: 3x week      Contractures Contractures Info: Not present    Additional Factors Info  Code Status, Allergies Code Status Info: Full Code Allergies Info: Fosinopril, Lisinopril and Enalapril           Current Medications (12/05/2020):  This is the current hospital active medication list Current Facility-Administered Medications  Medication Dose Route Frequency Provider Last Rate Last Admin   acetaminophen (TYLENOL) tablet 650 mg  650 mg Oral Q4H PRN Angiulli, 02/04/2021, PA-C       Or   acetaminophen (TYLENOL) suppository 650 mg  650 mg Rectal Q4H PRN Angiulli, Mcarthur Rossetti, PA-C       amLODipine (NORVASC) tablet 5 mg  5 mg Oral Daily Raulkar, Mcarthur Rossetti, MD   5 mg at 12/05/20 0845   aspirin chewable tablet 81 mg  81 mg Oral Daily 02/04/21, PA-C   81 mg at 12/05/20 0846   atorvastatin (LIPITOR) tablet 40 mg  40 mg Oral Daily 02/04/21, PA-C   40 mg at 12/05/20 0846   fluconazole (DIFLUCAN) tablet 150 mg  150 mg Oral Weekly Hammons, Kimberly B, RPH   150 mg at 12/03/20 1412   levothyroxine (SYNTHROID) tablet 50 mcg  50 mcg Oral Daily 02/02/21, PA-C   50 mcg at 12/05/20 0554   losartan (COZAAR) tablet 100 mg  100 mg Oral Daily 02/04/21, PA-C   100 mg at 12/05/20 0845   megestrol (  MEGACE) tablet 40 mg  40 mg Oral Daily Raulkar, Drema Pry, MD   40 mg at 12/05/20 0845   metoprolol succinate (TOPROL-XL) 24 hr tablet 50 mg  50 mg Oral Daily Charlton Amor, PA-C   50 mg at 12/05/20 0846   modafinil (PROVIGIL) tablet 200 mg  200 mg Oral Daily Raulkar, Drema Pry, MD   200 mg at 12/05/20 8280   nutrition supplement (JUVEN) (JUVEN) powder packet 1 packet  1 packet Oral BID BM Raulkar, Drema Pry, MD   1 packet at 12/05/20 1330   polyethylene glycol (MIRALAX / GLYCOLAX) packet 17 g  17 g Oral Daily Charlton Amor, PA-C   17 g at 12/05/20 0349   senna-docusate (Senokot-S) tablet 1 tablet  1 tablet Oral  BID Charlton Amor, PA-C   1 tablet at 12/05/20 1791   ticagrelor (BRILINTA) tablet 90 mg  90 mg Oral BID Charlton Amor, PA-C   90 mg at 12/05/20 5056   Zinc Oxide (TRIPLE PASTE) 12.8 % ointment   Topical PRN Charlton Amor, PA-C   Given at 11/30/20 0005     Discharge Medications: Please see discharge summary for a list of discharge medications.  Relevant Imaging Results:  Relevant Lab Results:   Additional Information SSN: [id number] COVID vaccines  Nasire Reali, Lemar Livings, LCSW

## 2020-12-05 NOTE — Progress Notes (Signed)
Patient ID: Maria Dyer, female   DOB: 1930-09-04, 87 y.o.   MRN: [id number]  Spoke with Jim-pt's nephew to discuss plan he reports they had a family meeting and have called a few ALF and have found they may be able to meet pt's needs at discharge. Will sent information to [facility] and see if they can meet pt's needs, since this is their first choice. Will email information to [facility] and see when can evaluate pt. Have reached out to [facility] and they have no beds. Have reached out to [facility] and left message.

## 2020-12-06 MED ORDER — BENZOCAINE 10 % MT GEL
Freq: Two times a day (BID) | OROMUCOSAL | Status: DC | PRN
  Administered 2020-12-06: 1 via OROMUCOSAL
  Filled 2020-12-06 (×2): qty 9

## 2020-12-06 MED ORDER — MEGESTROL ACETATE 400 MG/10ML PO SUSP
400.0000 mg | Freq: Every day | ORAL | Status: DC
  Administered 2020-12-06 – 2020-12-08 (×3): 400 mg via ORAL
  Filled 2020-12-06 (×3): qty 10

## 2020-12-06 MED ORDER — MAGNESIUM HYDROXIDE 400 MG/5ML PO SUSP
30.0000 mL | Freq: Once | ORAL | Status: AC
  Administered 2020-12-06: 30 mL via ORAL
  Filled 2020-12-06: qty 30

## 2020-12-06 MED ORDER — MODAFINIL 100 MG PO TABS
100.0000 mg | ORAL_TABLET | Freq: Every day | ORAL | Status: DC
  Administered 2020-12-07 – 2020-12-12 (×6): 100 mg via ORAL
  Filled 2020-12-06 (×6): qty 1

## 2020-12-06 NOTE — Progress Notes (Signed)
Speech Language Pathology Daily Session Note  Patient Details  Name: Maria Dyer MRN: [id number] Date of Birth: Jun 14, 1930  Today's Date: 12/06/2020 SLP Individual Time: 1100-1150 SLP Individual Time Calculation (min): 50 min  Short Term Goals: Week 2: SLP Short Term Goal 1 (Week 2): Pt will respond to yes/no questions with 70% accuracy with max A written and visual cues. SLP Short Term Goal 2 (Week 2): Pt follow identify common objects/pictures in a field of 2 with 70% accuracy with mod A written cues. SLP Short Term Goal 3 (Week 2): Pt will demonstrate intellectual awareness skills in response to yes/no questions to acute deficits with max A multimodal cues. SLP Short Term Goal 4 (Week 2): Pt will write at word/ 2 word phrase level with 70% accuracy with mod A verbal cues. SLP Short Term Goal 5 (Week 2): Pt will demonstrate reading comprehension at 2 word phrase level by matching phrase to picture in a field 2 with 75% accuracy with mod A verbal cues. SLP Short Term Goal 6 (Week 2): Pt will demonstrate recall of orientation information (place and situation) in response to yes/no questions with 50% accuracy given max A multimodal cues.  Skilled Therapeutic Interventions:   Patient seen for skilled ST session focusing on cognitive-linguistic goals. Patient was sleeping soundly when SLP arrived but she did awaken to tactile cues. She participated fully however SLP observed fidgety, jerky movements with head and extremities. (Per MD who SLP spoke with after session, this is likely side effect of one of her medications and it has already been reduced). Patient participated in naming task and matching word or sentence to corresponding picture in field of three. She was correct for 8/10 sentence descriptions, 10/10 when word choices were semantically different but only 55% when words were similar in spelling (pan, fan, fin). She named 9 of 23 object line drawings but for approximately 5-7 of those she  did not name correctly, she did gesture/pantomime object use. Patient correctly named month "September"and day of week "Thursday or Friday" . When SLP wrote What/where questions related to how she was feeling,etc, she was not able to adequately respond. SLP attempted use of audio amplifier but it did not seem to help any with hearing. Patient continues to benefit from skilled SLP intervention to maximize cognitive-linguistic function prior to discharge. Pain Pain Assessment Pain Scale: Faces Faces Pain Scale: No hurt  Therapy/Group: Individual Therapy  Angela Nevin, MA, CCC-SLP Speech Therapy

## 2020-12-06 NOTE — Progress Notes (Signed)
Occupational Therapy Session Note  Patient Details  Name: Maria Dyer MRN: [id number] Date of Birth: 11/25/1930  Today's Date: 12/06/2020 OT Individual Time: 1002-1025 OT Individual Time Calculation (min): 23 min  and Today's Date: 12/06/2020 OT Missed Time: 35 Minutes Missed Time Reason: Patient unwilling/refused to participate without medical reason   Short Term Goals: Week 2:  OT Short Term Goal 1 (Week 2): pt will stand with unilateral support for ~ 30 seconds as precursor to LB ADLS OT Short Term Goal 2 (Week 2): pt will complete 2/3 toileting tasks with MOD A OT Short Term Goal 3 (Week 2): pt will don LB clothing with MOD A  Skilled Therapeutic Interventions/Progress Updates:    Pt received seated in w/c with RN present, initially agreeable to therapy. Session focus on self-care retraining, activity tolerance, func transfers in prep for improved ADL/IADL/func mobility performance + decreased caregiver burden. Communicating via white board throughout session, as well. Offered bathroom and ADL at sink, pt declining stating " I just did that." Seated in w/c at sink, pt able to complete sit<>stand x2 with heavy BUE reliance on sink counter in order to wash hands + extensive encouragement. Pt reports " I can't do it" despite having just performed with CGA. Encouraged pt extensively to stand to water flowers. Pt adamantly declining and requesting to return to bed instead, stating "next week," "not today," and "just let me go rest." Presented pt with RW in front of bed and pt adamently declining, stating "that's too hard." With therapist in front of pt, completed stand pivot > bed with mod A and with LUE support on bed rail. Max A to return to supine on bed as pt with posterior LOB horizontally across bed. Attempted to engaged pt in discussion about family pictures, pt able to state various names of people in pictures, but unable to answer  yes/no questions when asked verbally (e.g., is that your  daughter? Is that animal a horse?, etc.).  Pt declining further activity, missed 30 min of OT.   Pt left semi-reclined in bed with bed alarm engaged, call bell in reach, and all immediate needs met.    Therapy Documentation Precautions:  Precautions Precautions: Fall Precaution Comments: receptive > expressive aphasia, HoH Restrictions Weight Bearing Restrictions: No  Pain: no c/o   ADL: See Care Tool for more details.  Therapy/Group: Individual Therapy  Volanda Napoleon MS, OTR/L  12/06/2020, 6:39 AM

## 2020-12-06 NOTE — Plan of Care (Signed)
Pt's plan of care adjusted to QD after speaking with care team and discussed with MD in team conference as pt currently unable to tolerate current therapy schedule with OT, PT, and SLP. Efforts have been made to include family, change staff/therapists, and different therapy techniques.   Wynelle Link, PT

## 2020-12-06 NOTE — Progress Notes (Signed)
Physical Therapy Session Note  Patient Details  Name: Maria Dyer MRN: [id number] Date of Birth: 1931/01/05  Today's Date: 12/06/2020 PT Individual Time: 1400-1430 PT Individual Time Calculation (min): 30 min   Short Term Goals: Week 2:  PT Short Term Goal 1 (Week 2): pt will complete bed mobility with CGA PT Short Term Goal 2 (Week 2): Pt will complete bed<>chair transfers with minA and LRAD PT Short Term Goal 3 (Week 2): Pt will ambulate 19ft with minA and LRAD  Skilled Therapeutic Interventions/Progress Updates:     Pt supine in bed at start of session. Her Great Nephew at bedside to assist with encouraging pt to participate in therapy. Pt assisted to EOB with minA with use of bed features. Completed squat<>pivot transfer with modA to her w/c - continues to be highly fearful of falling with high levels of anxiety despite nephew's encouragement. Pt wheeled to hallway in rehab gym and attempted to initiate gait with use of handrail and HHA but pt refuses adamantly and gets more worked up with anxiety. Deferred gait and she was assisted to Nustep machine via squat<>pivot with modA - fearful and anxiety contributing to deficits. Required setupA on Nustep and she completed a total of 5 minutes at workload of 4, using x4 extremities. Completed 229 steps in total and she had several efforts to end Nustep prior to 5 min mark due to self limiting behaviors. Assisted back to her w/c via modA squat pivot transfer and then returned to her room. Worked on sit<>stands in the room with pt facing the bed rail and using BUE's to assist to stand - she did quite well with this and was able to complete a total of 6 sit<>stands prior to giving up. She concluded session seated in w/c with safety belt alarm on and all needs within reach, nephew remained at bedside.   Therapy Documentation Precautions:  Precautions Precautions: Fall Precaution Comments: receptive > expressive aphasia, HoH Restrictions Weight  Bearing Restrictions: No General:    Therapy/Group: Individual Therapy  Orrin Brigham 12/06/2020, 3:40 PM

## 2020-12-06 NOTE — Progress Notes (Signed)
PROGRESS NOTE   Subjective/Complaints: Appreciate nursing check-in: last BM several days ago- will give milk of magnesia today Has been complaining of some dental pain- oral gel ordered Family noted some jerking movements- could be related to increase in modafinil so will decrease to 100mg   ROS: +dental pain  Objective:   No results found. No results for input(s): WBC, HGB, HCT, PLT in the last 72 hours.   Recent Labs    12/05/20 0452  NA 138  K 3.9  CL 110  CO2 23  GLUCOSE 81  BUN 18  CREATININE 0.93  CALCIUM 8.4*     Intake/Output Summary (Last 24 hours) at 12/06/2020 1058 Last data filed at 12/06/2020 0815 Gross per 24 hour  Intake 240 ml  Output --  Net 240 ml         Physical Exam: Vital Signs Blood pressure (!) 145/47, pulse 85, temperature 98 F (36.7 C), temperature source Oral, resp. rate 14, height 5\' 9"  (1.753 m), weight 59.3 kg, SpO2 100 %. Gen: no distress, normal appearing HEENT: oral mucosa pink and moist, NCAT Cardio: Reg rate Chest: normal effort, normal rate of breathing Abd: soft, non-distended Ext: no edema  Psychiatric: appropriate- very HOH Neurological: Ox0-1- wasn't able to give me her name without cues  Ext: no edema Psych: pleasant, normal affect, smiling Skin: bruising on both arms Neurological/MSK    Comments: Patient is severely hard of hearing.  Patient is awake alert, eyes are open.  Receptive> expressive aphasia. She did not follow commands. ModA gait within room. Impaired cognition Supine to sit MinA Transfers to bed MinAx2   Assessment/Plan: 1. Functional deficits which require 3+ hours per day of interdisciplinary therapy in a comprehensive inpatient rehab setting. Physiatrist is providing close team supervision and 24 hour management of active medical problems listed below. Physiatrist and rehab team continue to assess barriers to discharge/monitor patient  progress toward functional and medical goals  Care Tool:  Bathing    Body parts bathed by patient: Right arm, Left arm, Chest, Abdomen, Face, Right upper leg, Left upper leg   Body parts bathed by helper: Front perineal area, Buttocks, Right lower leg, Left lower leg     Bathing assist Assist Level: 2 Helpers     Upper Body Dressing/Undressing Upper body dressing   What is the patient wearing?: Pull over shirt    Upper body assist Assist Level: Minimal Assistance - Patient > 75%    Lower Body Dressing/Undressing Lower body dressing      What is the patient wearing?: Pants     Lower body assist Assist for lower body dressing: Maximal Assistance - Patient 25 - 49%     Toileting Toileting    Toileting assist Assist for toileting: Maximal Assistance - Patient 25 - 49%     Transfers Chair/bed transfer  Transfers assist     Chair/bed transfer assist level: Minimal Assistance - Patient > 75%     Locomotion Ambulation   Ambulation assist   Ambulation activity did not occur: Refused  Assist level: 2 helpers (mod A & +2 close w/c follow) Assistive device: Walker-Eva Max distance: 65ft   Walk 10 feet activity  Assist  Walk 10 feet activity did not occur: Refused        Walk 50 feet activity   Assist Walk 50 feet with 2 turns activity did not occur: Refused         Walk 150 feet activity   Assist Walk 150 feet activity did not occur: Refused         Walk 10 feet on uneven surface  activity   Assist Walk 10 feet on uneven surfaces activity did not occur: Refused         Wheelchair     Assist Is the patient using a wheelchair?: Yes Type of Wheelchair: Manual    Wheelchair assist level: Dependent - Patient 0%      Wheelchair 50 feet with 2 turns activity    Assist        Assist Level: Dependent - Patient 0%   Wheelchair 150 feet activity     Assist      Assist Level: Dependent - Patient 0%   Blood  pressure (!) 145/47, pulse 85, temperature 98 F (36.7 C), temperature source Oral, resp. rate 14, height 5\' 9"  (1.753 m), weight 59.3 kg, SpO2 100 %.  Medical Problem List and Plan: 1.  Aphasia secondary to left MCA patchy infarct/left M2 near occlusion status post revascularization with stenting             -patient may shower             -ELOS/Goals: 2-3 weeks S  Continue CIR- PT, OT and SLP 2.  Impaired mobility: -DVT/anticoagulation:  Mechanical: Sequential compression devices, below knee Bilateral lower extremities             -antiplatelet therapy: Continue Aspirin 81 mg daily and Brilinta 90 mg twice daily 3. Pain Management: N/A 4. Mood: Provide emotional support             -antipsychotic agents: N/A 5. Neuropsych: This patient is not capable of making decisions on her own behalf. 6. Skin/Wound Care: Routine skin checks 7. Fluids/Electrolytes/Nutrition: Routine in and outs with follow-up chemistries 8.  Hyperlipidemia.  Continue Lipitor 9.  Hypothyroidism.  Continue Synthroid 10.  Hypertension.  Labile, continue Cozaar 100 mg daily, Toprol-XL 50 mg daily.  Increase amlodipine to 5mg . Monitor with increased mobility. Magnesium level reviewed and normal.  11.  MASD buttocks and groin.  Zinc oxide as directed with routine skin checks. continue diflucan per pharmacy consult.  12. AKI: Normalized. Placed nursing order to encourage 6-8 glasses of water per day. 13. Hard of hearing: Try to wear see through masks when communicating with her 14. Low protein: continue Juven supplementation.  15. Fatigue: improved, continue Modafinil 100mg .  16. Decreased appetite: Continue Megace for appetite stimulation. 17. Sudden jerking movements noted by family: will decrease Modafinil to 100mg .  18. Constipation: milk of magnesia on 9/9   LOS: 11 days A FACE TO FACE EVALUATION WAS PERFORMED  P Rayven Rettig 12/06/2020, 10:58 AM

## 2020-12-06 NOTE — Progress Notes (Signed)
Physical Therapy Session Note  Patient Details  Name: Maria Dyer MRN: [id number] Date of Birth: 03-10-1931  Today's Date: 12/06/2020 PT Individual Time: 0800-0853 PT Individual Time Calculation (min): 53 min   Short Term Goals: Week 2:  PT Short Term Goal 1 (Week 2): pt will complete bed mobility with CGA PT Short Term Goal 2 (Week 2): Pt will complete bed<>chair transfers with minA and LRAD PT Short Term Goal 3 (Week 2): Pt will ambulate 40ft with minA and LRAD  Skilled Therapeutic Interventions/Progress Updates:     Pt supine in bed at start of session, completed with her breakfast. Upon entrance, she complains of tooth pain, in her back L mollars. Offered mouthwash which she used. RN notified after session. Pt noted be incontinent of bladder with saturated brief. Completed bed level brief change with totalA - able to roll in bed with supervision with use of bed rails and able to complete frontal pericare with setupA. Donned shorts at bed level as well, requiring minA for threading and then she was able to assist in pulling them up over hips while bridging. Next, completed supine there-ex as follows: -2x15 hip abduction, bilaterally -2x15 heel slides, bilaterally -2x15 SLR, bilaterally -2x10 bridges *she required max instruction for participation as she frequents "I can't." Or "that's good." Or "tomorrow." Very self limiting behavior even at bed level with no risk of falling. Also used dry erase board to assist with communication as needed.   Pt then refusing to get OOB to the chair despite max encouragement. Ultimately able to convince and completed supine<>sit with minA. Sit's EOB with supervision. Assisted to w/c via squat<>pivot transfer with modA for clearing hips. She refused any further mobility training and was agreeable to seated there-ex: -2x10 LAQ -2x10 hip marches -2x10 front arm raises  She's able to reposition self in chair without assist. Remained seated in w/c at  conclusion of session with all needs in reach and safety alarm on.   Therapy Documentation Precautions:  Precautions Precautions: Fall Precaution Comments: receptive > expressive aphasia, HoH Restrictions Weight Bearing Restrictions: No General:     Therapy/Group: Individual Therapy  Orrin Brigham 12/06/2020, 7:39 AM

## 2020-12-07 DIAGNOSIS — I10 Essential (primary) hypertension: Secondary | ICD-10-CM

## 2020-12-07 DIAGNOSIS — R32 Unspecified urinary incontinence: Secondary | ICD-10-CM

## 2020-12-07 LAB — URINALYSIS, COMPLETE (UACMP) WITH MICROSCOPIC
Bilirubin Urine: NEGATIVE
Glucose, UA: NEGATIVE mg/dL
Ketones, ur: 15 mg/dL — AB
Nitrite: NEGATIVE
Protein, ur: NEGATIVE mg/dL
Specific Gravity, Urine: 1.01 (ref 1.005–1.030)
Squamous Epithelial / HPF: NONE SEEN (ref 0–5)
pH: 6 (ref 5.0–8.0)

## 2020-12-07 MED ORDER — CEPHALEXIN 250 MG PO CAPS
500.0000 mg | ORAL_CAPSULE | Freq: Two times a day (BID) | ORAL | Status: DC
  Administered 2020-12-07 – 2020-12-09 (×4): 500 mg via ORAL
  Filled 2020-12-07 (×4): qty 2

## 2020-12-07 NOTE — Progress Notes (Signed)
Pt c/o sore mouth, food noted in left back pocket of gum. Side of left cheek noted to be ulcerated. Oral cares performed, pt mouth cleaned.  Mylo Red, LPN

## 2020-12-07 NOTE — Progress Notes (Signed)
MD Riley Kill made aware of pt UA results. Mylo Red, LPN

## 2020-12-07 NOTE — Progress Notes (Signed)
Physical Therapy Session Note  Patient Details  Name: Maria Dyer MRN: [id number] Date of Birth: [birth year]  Today's Date: 12/07/2020 PT Individual Time: 2297-9892 + 0950-1100 PT Individual Time Calculation (min): 25 min  + 70 min  Short Term Goals: Week 2:  PT Short Term Goal 1 (Week 2): pt will complete bed mobility with CGA PT Short Term Goal 2 (Week 2): Pt will complete bed<>chair transfers with minA and LRAD PT Short Term Goal 3 (Week 2): Pt will ambulate 76ft with minA and LRAD  Skilled Therapeutic Interventions/Progress Updates:     1st session: Pt in bed at start of session with breakfast tray in front of her, untouched. She c/o tooth pain, similar to yesterday. Encouraged her to eat some soft foods for nutritional purposes but she refused. Guided her in supine there-ex for LE strengthening, targeting hip abductors, quads, hamstrings, and hip flexors. She completed 1x10 reps for each exercise and needed consistent cueing for active participation. Frequently states "I can't" or "please no." She donned shorts at bed level with modA overall - increased difficulty this morning compared to yesterday with initiation and active participation. Able to bridge to pull pants over hips and can roll in bed without assist with use of bed rails. She required minA for scooting herself up in the bed with max cues for effort. She completed session in bed with HOB elevated and bed alarm on. Needs in reach.  2nd session: Pt in bed upon arrival. She's awake and is agreeable to therapy with convincing. Assisted to EOB with minA for trunk elevation with use of bed features. She is able to sit unsupported with SBA and then completes squat<>p[pivot transfer with modA to her w/c. Wheeled pt to the bathroom due to suspected bowel incontinence. Assisted to 3-1 [facility] over toilet via modA stand<>pivot transfer with use of grab bars in bathroom. Requires modA to stand from 3-1 [facility] with totalA for managing lower body  clothes. Brief dirty with smear of BM and then pt reports need for further toileting. Pt continent of bladder and able to produce small BM. Required totalA for donning a new brief and lower body clothes. Completed stand<>pivot transfer with modA and use of grab bars back to her w/c. Wheeled to main rehab hallway in w/c and placed facing the wall with wall rail in front of her. She completed several sit<>stands, at least x15, while using BUE to pull from rail to stand. For each stand, counted out loud to "10" to work on standing tolerance. Rest breaks b/w efforts due to fatigue. Able to progress to sit<>stands with static standing marching 10x10 reps bilaterally and then lateral side steps 5x7 reps. All completed with +2 minA from assist from nephew. Pt then returned to her room in w/c and assisted back to bed with squat<>pivot transfer with modA. Assisted to supine position with maxA for sit>supine and able to reposition herself in bed with minA. Remained supine in bed with all needs in reach and bed alarm on at end of session.  Therapy Documentation Precautions:  Precautions Precautions: Fall Precaution Comments: receptive > expressive aphasia, HoH Restrictions Weight Bearing Restrictions: No General:    Therapy/Group: Individual Therapy  Rockwell Zentz P Calin Fantroy 12/07/2020, 7:05 AM

## 2020-12-07 NOTE — Progress Notes (Signed)
Restless most of the night. Confused, looking for family members. Frequent reorientation and redirection provided by staff. Incontinent of bowel and bladder throughout the night. Turned and repositioned on hourly rounding. Denies pain. Resistive to personal care at times. However with reassurance from staff pt compliant with care.

## 2020-12-07 NOTE — Progress Notes (Signed)
Speech Language Pathology Daily Session Note  Patient Details  Name: TORIANNE LAFLAM MRN: [id number] Date of Birth: [birth year]  Today's Date: 12/07/2020 SLP Individual Time: 1345-1430 SLP Individual Time Calculation (min): 45 min  Short Term Goals: Week 2: SLP Short Term Goal 1 (Week 2): Pt will respond to yes/no questions with 70% accuracy with max A written and visual cues. SLP Short Term Goal 2 (Week 2): Pt follow identify common objects/pictures in a field of 2 with 70% accuracy with mod A written cues. SLP Short Term Goal 3 (Week 2): Pt will demonstrate intellectual awareness skills in response to yes/no questions to acute deficits with max A multimodal cues. SLP Short Term Goal 4 (Week 2): Pt will write at word/ 2 word phrase level with 70% accuracy with mod A verbal cues. SLP Short Term Goal 5 (Week 2): Pt will demonstrate reading comprehension at 2 word phrase level by matching phrase to picture in a field 2 with 75% accuracy with mod A verbal cues. SLP Short Term Goal 6 (Week 2): Pt will demonstrate recall of orientation information (place and situation) in response to yes/no questions with 50% accuracy given max A multimodal cues.  Skilled Therapeutic Interventions:   Patient seen for skilled ST session focusing on expressive-receptive aphasia. Patient napping but awakened easily to light touch. She required mod-maxA cues to respond to basic level open-ended questions and accuracy was poor. For example, SLP wrote on communication board, "what are you watching on TV?" And gestured to TV. Patient's response was "I dont know, were watching it". When SLP showed patient a photo of PT who has been working with her today and other days and wrote on board, "Did he work with you today?" She smiled as if to recognize photo and gestured to her pants and to bathroom. PT did report that he assisted patient with toileting today. Patient then was touching left side of face around jaw and saying "it  hurts" and "there's something the matter.....and it hurts (mouth)" She asked SLP "call my dentist". SLP informed nurse as apparently patient has complained of this pain before. Patient unable to utilize visual aides for recall of where she was, etc but today she did not seem as resistant when SLP telling her she was in a hospital. Patient continues to benefit from skilled SLP intervention to maximize cognitive-linguistic function prior to discharge.  Pain Pain Assessment Pain Scale: Faces Faces Pain Scale: Hurts even more Pain Type: Acute pain Pain Location: Teeth Pain Orientation: Left;Posterior Pain Descriptors / Indicators: Aching;Discomfort Pain Onset: Gradual Pain Intervention(s): RN made aware  Therapy/Group: Individual Therapy  Angela Nevin, MA, CCC-SLP Speech Therapy

## 2020-12-07 NOTE — Progress Notes (Signed)
PROGRESS NOTE   Subjective/Complaints: Confused, restless last night. Sleeping when I came in. Ongoing incontinence  ROS: Limited due to cognitive/behavioral   Objective:   No results found. No results for input(s): WBC, HGB, HCT, PLT in the last 72 hours.   Recent Labs    12/05/20 0452  NA 138  K 3.9  CL 110  CO2 23  GLUCOSE 81  BUN 18  CREATININE 0.93  CALCIUM 8.4*     Intake/Output Summary (Last 24 hours) at 12/07/2020 0904 Last data filed at 12/06/2020 2100 Gross per 24 hour  Intake 240 ml  Output --  Net 240 ml         Physical Exam: Vital Signs Blood pressure (!) 152/75, pulse 88, temperature 98.5 F (36.9 C), temperature source Oral, resp. rate 16, height 5\' 9"  (1.753 m), weight 59.3 kg, SpO2 100 %. Constitutional: No distress . Vital signs reviewed. HEENT: NCAT, EOMI, oral membranes moist Neck: supple Cardiovascular: RRR without murmur. No JVD    Respiratory/Chest: CTA Bilaterally without wheezes or rales. Normal effort    GI/Abdomen: BS +, non-tender, non-distended Ext: no clubbing, cyanosis, or edema Psych: pleasant, confused Neurological: Ox0-1- wasn't able to give me her name without cues  Ext: no edema Psych: pleasant, normal affect, smiling Skin: bruising on both arms Neurological/MSK    Comments: Patient is severely hard of hearing.  Patient is awake alert, eyes are open.  Receptive> expressive aphasia. She did not follow commands. ModA gait within room. Impaired cognition Supine to sit MinA Transfers to bed MinAx2   Assessment/Plan: 1. Functional deficits which require 3+ hours per day of interdisciplinary therapy in a comprehensive inpatient rehab setting. Physiatrist is providing close team supervision and 24 hour management of active medical problems listed below. Physiatrist and rehab team continue to assess barriers to discharge/monitor patient progress toward functional and  medical goals  Care Tool:  Bathing    Body parts bathed by patient: Right arm, Left arm, Chest, Abdomen, Face, Right upper leg, Left upper leg   Body parts bathed by helper: Front perineal area, Buttocks, Right lower leg, Left lower leg     Bathing assist Assist Level: 2 Helpers     Upper Body Dressing/Undressing Upper body dressing   What is the patient wearing?: Pull over shirt    Upper body assist Assist Level: Minimal Assistance - Patient > 75%    Lower Body Dressing/Undressing Lower body dressing      What is the patient wearing?: Pants     Lower body assist Assist for lower body dressing: Maximal Assistance - Patient 25 - 49%     Toileting Toileting    Toileting assist Assist for toileting: Maximal Assistance - Patient 25 - 49%     Transfers Chair/bed transfer  Transfers assist     Chair/bed transfer assist level: Minimal Assistance - Patient > 75%     Locomotion Ambulation   Ambulation assist   Ambulation activity did not occur: Refused  Assist level: 2 helpers (mod A & +2 close w/c follow) Assistive device: Walker-Eva Max distance: 59ft   Walk 10 feet activity   Assist  Walk 10 feet activity did not occur:  Refused        Walk 50 feet activity   Assist Walk 50 feet with 2 turns activity did not occur: Refused         Walk 150 feet activity   Assist Walk 150 feet activity did not occur: Refused         Walk 10 feet on uneven surface  activity   Assist Walk 10 feet on uneven surfaces activity did not occur: Refused         Wheelchair     Assist Is the patient using a wheelchair?: Yes Type of Wheelchair: Manual    Wheelchair assist level: Dependent - Patient 0%      Wheelchair 50 feet with 2 turns activity    Assist        Assist Level: Dependent - Patient 0%   Wheelchair 150 feet activity     Assist      Assist Level: Dependent - Patient 0%   Blood pressure (!) 152/75, pulse 88,  temperature 98.5 F (36.9 C), temperature source Oral, resp. rate 16, height 5\' 9"  (1.753 m), weight 59.3 kg, SpO2 100 %.  Medical Problem List and Plan: 1.  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  Impaired mobility: -DVT/anticoagulation:  Mechanical: Sequential compression devices, below knee Bilateral lower extremities             -antiplatelet therapy: Continue Aspirin 81 mg daily and Brilinta 90 mg twice daily 3. Pain Management: N/A 4. Mood: Provide emotional support             -antipsychotic agents: N/A 5. Neuropsych: This patient is not capable of making decisions on her own behalf.  ?increased confusion, incontinence   -check UA, UCX 6. Skin/Wound Care: Routine skin checks 7. Fluids/Electrolytes/Nutrition: Routine in and outs with follow-up chemistries 8.  Hyperlipidemia.  Continue Lipitor 9.  Hypothyroidism.  Continue Synthroid 10.  Hypertension.  Labile, continue Cozaar 100 mg daily, Toprol-XL 50 mg daily.  Increase amlodipine to 5mg . Monitor with increased mobility. Magnesium level reviewed and normal.  11.  MASD buttocks and groin.  Zinc oxide as directed with routine skin checks. continue diflucan per pharmacy consult.  12. AKI: Normalized. Placed nursing order to encourage 6-8 glasses of water per day. 13. Hard of hearing: Try to wear see through masks when communicating with her 14. Low protein: continue Juven supplementation.  15. Fatigue: improved, continue Modafinil 100mg .  16. Decreased appetite: Continue Megace for appetite stimulation. 17. Sudden jerking movements noted by family: decreased Modafinil to 100mg .  18. Constipation: milk of magnesia on 9/9   LOS: 12 days A FACE TO FACE EVALUATION WAS PERFORMED  12/07/2020, 9:04 AM

## 2020-12-08 MED ORDER — MAGIC MOUTHWASH W/LIDOCAINE
5.0000 mL | Freq: Four times a day (QID) | ORAL | Status: DC
  Administered 2020-12-08 – 2020-12-12 (×13): 5 mL via ORAL
  Filled 2020-12-08 (×17): qty 5

## 2020-12-08 MED ORDER — MEGESTROL ACETATE 400 MG/10ML PO SUSP
400.0000 mg | Freq: Two times a day (BID) | ORAL | Status: DC
  Administered 2020-12-08 – 2020-12-10 (×4): 400 mg via ORAL
  Filled 2020-12-08 (×4): qty 10

## 2020-12-08 NOTE — Progress Notes (Signed)
Physical Therapy Session Note  Patient Details  Name: Maria Dyer MRN: [id number] Date of Birth: 04-Aug-1930  Today's Date: 12/08/2020 PT Individual Time: 1445-1510 PT Individual Time Calculation (min): 25 min   Short Term Goals: Week 2:  PT Short Term Goal 1 (Week 2): pt will complete bed mobility with CGA PT Short Term Goal 2 (Week 2): Pt will complete bed<>chair transfers with minA and LRAD PT Short Term Goal 3 (Week 2): Pt will ambulate 65ft with minA and LRAD  Skilled Therapeutic Interventions/Progress Updates:    Pt received seated in w/c in room, appears agreeable to PT session. No complaints of pain. Sit to stand x 3 reps to rail in hallway with CGA to stand. Pt counts to 15 and able to stand x 15 sec for each rep. Pt declines to perform any further standing after 3 reps. Pt perseverative on returning to her room. Pt returned to room to determine if she can further participate in session. Attempt to have pt stand to EOB using bedrail, pt confused by therapist directions and attempts to transfer into bed, realizes transfer is unsafe and returns to sitting in w/c. Pt then indicates she wants to lay back down in bed. Stand pivot transfer w/c to bed with max A due to near fall as pt sits down on side of bed before fully turning to complete transfer and slides down side of bed towards the floor. With max A pt able to be returned to sitting safely on side of bed. Pt unable to follow cues to lay down in bed and lays down straight backwards instead of turning. Pt is max A to bring BLE into the bed and reposition trunk so that she is aligned in bed properly. Pt left semi-reclined in bed with needs in reach. Pt missed 20 min of scheduled therapy session due to refusal to participate. Utilized therapist writing out questions and directions on white board throughout session for improved communication with patient.  Therapy Documentation Precautions:  Precautions Precautions: Fall Precaution  Comments: receptive > expressive aphasia, HoH Restrictions Weight Bearing Restrictions: No General: PT Amount of Missed Time (min): 20 Minutes PT Missed Treatment Reason: Patient unwilling to participate     Therapy/Group: Individual Therapy   Peter Congo, PT, DPT, CSRS  12/08/2020, 3:11 PM

## 2020-12-08 NOTE — Progress Notes (Signed)
Pt refused lunch. Encouraged pt to drink ensure. Mylo Red, LPN

## 2020-12-08 NOTE — Progress Notes (Signed)
PROGRESS NOTE   Subjective/Complaints: Up in bed. Appears comfortable. No issues reported overnight. Ulcer left cheek  ROS: limited due to language/communication   Objective:   No results found. No results for input(s): WBC, HGB, HCT, PLT in the last 72 hours.   No results for input(s): NA, K, CL, CO2, GLUCOSE, BUN, CREATININE, CALCIUM in the last 72 hours.    Intake/Output Summary (Last 24 hours) at 12/08/2020 0758 Last data filed at 12/08/2020 0748 Gross per 24 hour  Intake 540 ml  Output 450 ml  Net 90 ml         Physical Exam: Vital Signs Blood pressure 130/62, pulse 89, temperature 98.2 F (36.8 C), resp. rate 14, height 5\' 9"  (1.753 m), weight 59.3 kg, SpO2 99 %. Constitutional: No distress . Vital signs reviewed. HEENT: NCAT, EOMI, oral membranes moist Neck: supple Cardiovascular: RRR without murmur. No JVD    Respiratory/Chest: CTA Bilaterally without wheezes or rales. Normal effort    GI/Abdomen: BS +, non-tender, non-distended Ext: no clubbing, cyanosis, or edema Psych: pleasant and cooperative  Neurological: Ox0-1- wasn't able to give me her name without cues  Ext: no edema Psych: pleasant, normal affect, smiling Skin: bruising on both arms Neurological/MSK    Comments: Patient is severely hard of hearing.  Patient is awake alert, eyes are open.  Receptive> expressive aphasia unchanged. She did not follow commands.     Assessment/Plan: 1. Functional deficits which require 3+ hours per day of interdisciplinary therapy in a comprehensive inpatient rehab setting. Physiatrist is providing close team supervision and 24 hour management of active medical problems listed below. Physiatrist and rehab team continue to assess barriers to discharge/monitor patient progress toward functional and medical goals  Care Tool:  Bathing    Body parts bathed by patient: Right arm, Left arm, Chest, Abdomen, Face,  Right upper leg, Left upper leg   Body parts bathed by helper: Front perineal area, Buttocks, Right lower leg, Left lower leg     Bathing assist Assist Level: 2 Helpers     Upper Body Dressing/Undressing Upper body dressing   What is the patient wearing?: Pull over shirt    Upper body assist Assist Level: Minimal Assistance - Patient > 75%    Lower Body Dressing/Undressing Lower body dressing      What is the patient wearing?: Pants     Lower body assist Assist for lower body dressing: Maximal Assistance - Patient 25 - 49%     Toileting Toileting    Toileting assist Assist for toileting: Maximal Assistance - Patient 25 - 49%     Transfers Chair/bed transfer  Transfers assist     Chair/bed transfer assist level: Minimal Assistance - Patient > 75%     Locomotion Ambulation   Ambulation assist   Ambulation activity did not occur: Refused  Assist level: 2 helpers (mod A & +2 close w/c follow) Assistive device: Walker-Eva Max distance: 58ft   Walk 10 feet activity   Assist  Walk 10 feet activity did not occur: Refused        Walk 50 feet activity   Assist Walk 50 feet with 2 turns activity did not occur: Refused  Walk 150 feet activity   Assist Walk 150 feet activity did not occur: Refused         Walk 10 feet on uneven surface  activity   Assist Walk 10 feet on uneven surfaces activity did not occur: Refused         Wheelchair     Assist Is the patient using a wheelchair?: Yes Type of Wheelchair: Manual    Wheelchair assist level: Dependent - Patient 0%      Wheelchair 50 feet with 2 turns activity    Assist        Assist Level: Dependent - Patient 0%   Wheelchair 150 feet activity     Assist      Assist Level: Dependent - Patient 0%   Blood pressure 130/62, pulse 89, temperature 98.2 F (36.8 C), resp. rate 14, height 5\' 9"  (1.753 m), weight 59.3 kg, SpO2 99 %.  Medical Problem List and  Plan: 1.  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  Impaired mobility: -DVT/anticoagulation:  Mechanical: Sequential compression devices, below knee Bilateral lower extremities             -antiplatelet therapy: Continue Aspirin 81 mg daily and Brilinta 90 mg twice daily 3. Pain Management: N/A 4. Mood: Provide emotional support             -antipsychotic agents: N/A 5. Neuropsych: This patient is not capable of making decisions on her own behalf.  ?increased confusion, incontinence   - UA+, UCx pending   -empiric keflex started 9/10   -seems a little brighter today 9/11 6. Skin/Wound Care: Routine skin checks 7. Fluids/Electrolytes/Nutrition: Routine in and outs with follow-up chemistries 8.  Hyperlipidemia.  Continue Lipitor 9.  Hypothyroidism.  Continue Synthroid 10.  Hypertension.  Labile, continue Cozaar 100 mg daily, Toprol-XL 50 mg daily.  Increase amlodipine to 5mg . Monitor with increased mobility. Magnesium level reviewed and normal.  11.  MASD buttocks and groin.  Zinc oxide as directed with routine skin checks. continue diflucan per pharmacy consult.  12. AKI: Normalized. Placed nursing order to encourage 6-8 glasses of water per day. 13. Hard of hearing: Try to wear see through masks when communicating with her 14. Low protein: continue Juven supplementation.  15. Fatigue: improved, continue Modafinil 100mg .  16. Decreased appetite: Continue Megace for appetite stimulation.  9/11-still eating little   -increase megace to bid   -ask RD for input  -check bmet and prealbumin tomorrow 17. Sudden jerking movements noted by family: decreased Modafinil to 100mg .  18. Constipation: milk of magnesia on 9/9   LOS: 13 days A FACE TO FACE EVALUATION WAS PERFORMED  12/08/2020, 7:58 AM

## 2020-12-08 NOTE — Progress Notes (Signed)
Occupational Therapy Session Note  Patient Details  Name: Maria Dyer MRN: [id number] Date of Birth: April 07, 1930  Today's Date: 12/08/2020 OT Individual Time: 8182-9937 OT Individual Time Calculation (min): 43 min    Short Term Goals: Week 2:  OT Short Term Goal 1 (Week 2): pt will stand with unilateral support for ~ 30 seconds as precursor to LB ADLS OT Short Term Goal 2 (Week 2): pt will complete 2/3 toileting tasks with MOD A OT Short Term Goal 3 (Week 2): pt will don LB clothing with MOD A  Skilled Therapeutic Interventions/Progress Updates:    Patient in bed, alert, son present.  Patient able to read and understand questions posed on dry erase board.  She is disoriented and does not understand her current situation.  She states "I cant do it, we will do it tomorrow...." but does move with gentle guidance and responds well to her sons instructions.  Donns shorts at bed level with max A.  Supine to sitting edge of bed with max A.  Sit to stand and SPT to/from w/c and bed with max A.  To therapy gym via w/c - SPT to/from mat table with max A - she is able to maintain unsupported sitting with CS, occ min A for posterior lean.  She participated in bean bag toss with both right and left hands, completed trunk and upperbody AROM with encouragement.  Sit to stand from w/c surface to bed rails in front of her with CGA for 5 repetitions maintaining stance for approx 10-15 seconds each attempt.  She remained seated in wc at close of session, seat belt alarm set and call bell/tray table in reach.    Therapy Documentation Precautions:  Precautions Precautions: Fall Precaution Comments: receptive > expressive aphasia, HoH Restrictions Weight Bearing Restrictions: No   Therapy/Group: Individual Therapy  Barrie Lyme 12/08/2020, 7:46 AM

## 2020-12-09 LAB — BASIC METABOLIC PANEL
Anion gap: 9 (ref 5–15)
BUN: 17 mg/dL (ref 8–23)
CO2: 22 mmol/L (ref 22–32)
Calcium: 8.5 mg/dL — ABNORMAL LOW (ref 8.9–10.3)
Chloride: 106 mmol/L (ref 98–111)
Creatinine, Ser: 0.97 mg/dL (ref 0.44–1.00)
GFR, Estimated: 56 mL/min — ABNORMAL LOW (ref >60)
Glucose, Bld: 75 mg/dL (ref 70–99)
Potassium: 3.5 mmol/L (ref 3.5–5.1)
Sodium: 137 mmol/L (ref 135–145)

## 2020-12-09 LAB — URINE CULTURE: Culture: >=100000 — AB

## 2020-12-09 LAB — CBC
HCT: 28.4 % — ABNORMAL LOW (ref 36.0–46.0)
Hemoglobin: 9.5 g/dL — ABNORMAL LOW (ref 12.0–15.0)
MCH: 32.1 pg (ref 26.0–34.0)
MCHC: 33.5 g/dL (ref 30.0–36.0)
MCV: 95.9 fL (ref 80.0–100.0)
Platelets: 274 10*3/uL (ref 150–400)
RBC: 2.96 MIL/uL — ABNORMAL LOW (ref 3.87–5.11)
RDW: 14.3 % (ref 11.5–15.5)
WBC: 6.1 10*3/uL (ref 4.0–10.5)
nRBC: 0 % (ref 0.0–0.2)

## 2020-12-09 LAB — PREALBUMIN: Prealbumin: 12.1 mg/dL — ABNORMAL LOW (ref 18–38)

## 2020-12-09 MED ORDER — AMOXICILLIN 250 MG PO CAPS
250.0000 mg | ORAL_CAPSULE | Freq: Three times a day (TID) | ORAL | Status: DC
  Administered 2020-12-09: 250 mg via ORAL
  Filled 2020-12-09: qty 1

## 2020-12-09 MED ORDER — AMOXICILLIN 250 MG PO CAPS
250.0000 mg | ORAL_CAPSULE | Freq: Three times a day (TID) | ORAL | Status: AC
  Administered 2020-12-09 – 2020-12-11 (×7): 250 mg via ORAL
  Filled 2020-12-09 (×7): qty 1

## 2020-12-09 MED ORDER — ENSURE ENLIVE PO LIQD
237.0000 mL | Freq: Three times a day (TID) | ORAL | Status: DC
  Administered 2020-12-10 – 2020-12-11 (×5): 237 mL via ORAL

## 2020-12-09 NOTE — Progress Notes (Signed)
Patient ID: Maria Dyer, female   DOB: January 25, 1931, 87 y.o.   MRN: [id number]  Spoke with [facility] who has spoken with family and this is the place they want pt to go to at discharge. Can not move in on Friday will need to move discharge to Thursday and work on equipment needs, paperwork and will need TB and COVID test. Will work on plan for Thursday.

## 2020-12-09 NOTE — Plan of Care (Signed)
  Problem: Consults Goal: RH STROKE PATIENT EDUCATION Description: See Patient Education module for education specifics  Outcome: Progressing   Problem: RH BOWEL ELIMINATION Goal: RH STG MANAGE BOWEL WITH ASSISTANCE Description: STG Manage Bowel with mod I Assistance. Outcome: Progressing   Problem: RH BLADDER ELIMINATION Goal: RH STG MANAGE BLADDER WITH ASSISTANCE Description: STG Manage Bladder With mod I Assistance Outcome: Progressing   Problem: RH SKIN INTEGRITY Goal: RH STG MAINTAIN SKIN INTEGRITY WITH ASSISTANCE Description: STG Maintain Skin Integrity With min Assistance. Outcome: Progressing   Problem: RH SAFETY Goal: RH STG ADHERE TO SAFETY PRECAUTIONS W/ASSISTANCE/DEVICE Description: STG Adhere to Safety Precautions With cues/supervision Assistance/Device. Outcome: Progressing   Problem: RH PAIN MANAGEMENT Goal: RH STG PAIN MANAGED AT OR BELOW PT'S PAIN GOAL Description: At or below level 4 Outcome: Progressing   Problem: RH KNOWLEDGE DEFICIT Goal: RH STG INCREASE KNOWLEDGE OF HYPERTENSION Description: Patient will be able to manage HTN using medications and dietary modifications using handouts and educational tools w cues Outcome: Progressing Goal: RH STG INCREASE KNOWLEGDE OF HYPERLIPIDEMIA Description: Patient will be able to manage HLD using medications and dietary modifications using handouts and educational tools w cues Outcome: Progressing Goal: RH STG INCREASE KNOWLEDGE OF STROKE PROPHYLAXIS Description: Patient will be able to manage secondary stroke risks using medications and dietary modifications using handouts and educational tools w cues Outcome: Progressing   Problem: RH KNOWLEDGE DEFICIT Goal: RH STG INCREASE KNOWLEDGE OF DIABETES Description: Patient will be able to manage prrediabetes  using medications and dietary modifications using handouts and educational tools w cues Outcome: Progressing

## 2020-12-09 NOTE — Progress Notes (Signed)
PROGRESS NOTE   Subjective/Complaints: Maria Dyer has no complaints this morning She is watching TV Alert and smiling Participating with encouragement  ROS: Limited due to language/communication   Objective:   No results found. Recent Labs    12/09/20 0528  WBC 6.1  HGB 9.5*  HCT 28.4*  PLT 274     Recent Labs    12/09/20 0528  NA 137  K 3.5  CL 106  CO2 22  GLUCOSE 75  BUN 17  CREATININE 0.97  CALCIUM 8.5*      Intake/Output Summary (Last 24 hours) at 12/09/2020 1115 Last data filed at 12/09/2020 0802 Gross per 24 hour  Intake 460 ml  Output --  Net 460 ml         Physical Exam: Vital Signs Blood pressure (!) 150/59, pulse 71, temperature 97.6 F (36.4 C), temperature source Oral, resp. rate 14, height 5\' 9"  (1.753 m), weight 60.6 kg, SpO2 100 %. Gen: no distress, normal appearing HEENT: oral mucosa pink and moist, NCAT Cardio: Reg rate Chest: normal effort, normal rate of breathing Abd: soft, non-distended Ext: no edema Psych: pleasant, normal affect Skin: intact  Neurological: Ox0-1- wasn't able to give me her name without cues  Ext: no edema Psych: pleasant, normal affect, smiling Skin: bruising on both arms Neurological/MSK    Comments: Patient is severely hard of hearing.  Patient is awake alert, eyes are open.  Receptive> expressive aphasia unchanged. She did not follow commands.     Assessment/Plan: 1. Functional deficits which require 3+ hours per day of interdisciplinary therapy in a comprehensive inpatient rehab setting. Physiatrist is providing close team supervision and 24 hour management of active medical problems listed below. Physiatrist and rehab team continue to assess barriers to discharge/monitor patient progress toward functional and medical goals  Care Tool:  Bathing    Body parts bathed by patient: Right arm, Left arm, Chest, Abdomen, Face, Right upper leg,  Left upper leg   Body parts bathed by helper: Front perineal area, Buttocks, Right lower leg, Left lower leg     Bathing assist Assist Level: 2 Helpers     Upper Body Dressing/Undressing Upper body dressing   What is the patient wearing?: Pull over shirt    Upper body assist Assist Level: Minimal Assistance - Patient > 75%    Lower Body Dressing/Undressing Lower body dressing      What is the patient wearing?: Pants     Lower body assist Assist for lower body dressing: Maximal Assistance - Patient 25 - 49%     Toileting Toileting    Toileting assist Assist for toileting: Maximal Assistance - Patient 25 - 49%     Transfers Chair/bed transfer  Transfers assist     Chair/bed transfer assist level: Moderate Assistance - Patient 50 - 74%     Locomotion Ambulation   Ambulation assist   Ambulation activity did not occur: Refused  Assist level: 2 helpers (mod A & +2 close w/c follow) Assistive device: Walker-Eva Max distance: 4ft   Walk 10 feet activity   Assist  Walk 10 feet activity did not occur: Refused        Walk 50  feet activity   Assist Walk 50 feet with 2 turns activity did not occur: Refused         Walk 150 feet activity   Assist Walk 150 feet activity did not occur: Refused         Walk 10 feet on uneven surface  activity   Assist Walk 10 feet on uneven surfaces activity did not occur: Refused         Wheelchair     Assist Is the patient using a wheelchair?: Yes Type of Wheelchair: Manual    Wheelchair assist level: Dependent - Patient 0%      Wheelchair 50 feet with 2 turns activity    Assist        Assist Level: Dependent - Patient 0%   Wheelchair 150 feet activity     Assist      Assist Level: Dependent - Patient 0%   Blood pressure (!) 150/59, pulse 71, temperature 97.6 F (36.4 C), temperature source Oral, resp. rate 14, height 5\' 9"  (1.753 m), weight 60.6 kg, SpO2 100 %.  Medical  Problem List and Plan: 1.  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  Impaired mobility: -DVT/anticoagulation:  Mechanical: Sequential compression devices, below knee Bilateral lower extremities             -antiplatelet therapy: Continue Aspirin 81 mg daily and Brilinta 90 mg twice daily 3. Pain Management: N/A 4. Mood: Provide emotional support             -antipsychotic agents: N/A 5. Neuropsych: This patient is not capable of making decisions on her own behalf.  ?increased confusion, incontinence   - UA+, UCx pending   -empiric keflex started 9/10   -seems a little brighter today 9/11- UC with >100,000 aerococcus, switch to amoxicillin 6. Skin/Wound Care: Routine skin checks 7. Fluids/Electrolytes/Nutrition: Routine in and outs with follow-up chemistries 8.  Hyperlipidemia.  Continue Lipitor 9.  Hypothyroidism.  Continue Synthroid 10.  Hypertension.  Labile, continue Cozaar 100 mg daily, Toprol-XL 50 mg daily.  Increase amlodipine to 5mg . Monitor with increased mobility. Magnesium level reviewed and normal.  11.  MASD buttocks and groin.  Zinc oxide as directed with routine skin checks. continue diflucan per pharmacy consult.  12. AKI: Normalized. Placed nursing order to encourage 6-8 glasses of water per day. 13. Hard of hearing: Try to wear see through masks when communicating with her 14. Low protein: continue Juven supplementation.  15. Fatigue: improved, continue Modafinil 100mg .  16. Decreased appetite:    -ask RD for input  -BMET stable, prealbumin 21, continue megace 17. Sudden jerking movements noted by family: decreased Modafinil to 100mg .  18. Constipation: milk of magnesia on 9/9   LOS: 14 days A FACE TO FACE EVALUATION WAS PERFORMED  Khyan Oats P Chakara Bognar 12/09/2020, 11:15 AM

## 2020-12-09 NOTE — Progress Notes (Signed)
Speech Language Pathology Weekly Progress and Session Note  Patient Details  Name: Maria Dyer MRN: [id number] Date of Birth: September 09, 1930  Beginning of progress report period: December 02, 2020 End of progress report period: December 09, 2020  Today's Date: 12/09/2020 SLP Individual Time: 8295-6213 SLP Individual Time Calculation (min): 45 min  Short Term Goals: Week 2: SLP Short Term Goal 1 (Week 2): Pt will respond to yes/no questions with 70% accuracy with max A written and visual cues. SLP Short Term Goal 1 - Progress (Week 2): Not met SLP Short Term Goal 2 (Week 2): Pt follow identify common objects/pictures in a field of 2 with 70% accuracy with mod A written cues. SLP Short Term Goal 2 - Progress (Week 2): Met SLP Short Term Goal 3 (Week 2): Pt will demonstrate intellectual awareness skills in response to yes/no questions to acute deficits with max A multimodal cues. SLP Short Term Goal 3 - Progress (Week 2): Not met SLP Short Term Goal 4 (Week 2): Pt will write at word/ 2 word phrase level with 70% accuracy with mod A verbal cues. SLP Short Term Goal 4 - Progress (Week 2): Not met SLP Short Term Goal 5 (Week 2): Pt will demonstrate reading comprehension at 2 word phrase level by matching phrase to picture in a field 2 with 75% accuracy with mod A verbal cues. SLP Short Term Goal 5 - Progress (Week 2): Met SLP Short Term Goal 6 (Week 2): Pt will demonstrate recall of orientation information (place and situation) in response to yes/no questions with 50% accuracy given max A multimodal cues. SLP Short Term Goal 6 - Progress (Week 2): Met    New Short Term Goals: Week 3: SLP Short Term Goal 1 (Week 3): STG=LTG due to awaiting SNF placement  Weekly Progress Updates: Pt made moderate progress meeting 3 out 6 goals. Pt is able to express wants/needs at phrase level with mod A verbal cues for clarification. Pt demonstrates increase expressive and receptive language skills (see note  below for details) when pt is participatory, however due to poor intellectual awareness and carryover of novel information including orientation pt had made slow progress. Long term goals have been downgraded this reporting period. Pt would continue to benefit from skilled ST services in order to maximize functional independence and reduce burden of care, requiring 24 hour supervision at discharge with continued skilled ST services.       Intensity: Minumum of 1-2 x/day, 30 to 90 minutes Frequency: 3 to 5 out of 7 days Duration/Length of Stay: Awwaiting SNF placement Treatment/Interventions: Cognitive remediation/compensation;Cueing hierarchy;Functional tasks;Internal/external aids;Multimodal communication approach;Patient/family education;Speech/Language facilitation   Daily Session  Skilled Therapeutic Interventions: Skilled ST services focused on language skills. SLP facilitated response to yes/no questions pertaining to biographical/environmental questions increasing from 40% to 67% accuracy with the use of written aids. Pt was able to recall orientation situation/place with 50% accuracy given yes/no questions and visual aids. Pt was able to name common objects with 63% accuracy with no awareness of phonetic errors. Pt was able to identify black.white pictures in a field of 2 with 85% accuracy and mod A demonstration and semantic cues. Pt was able to match 2-3 word object function phrase to black/white object pictures in a field of 2 with 80% accuracy and 100% accuracy matching 4-5 word function phrases to black/white picture of objects ina field of 2. Pt refused to attempt witting task and demonstrated little to no awareness of acute deficits. Pt missed 15 minutes  of treatment due to polite refusal to continued ST services. Pt was left in room with call bell within reach and bed alarm set. SLP recommends to continue skilled services.     General    Pain Pain Assessment Pain Score: 0-No  pain  Therapy/Group: Individual Therapy  Maria Dyer  [facility] 12/09/2020, 1:02 PM

## 2020-12-09 NOTE — Progress Notes (Signed)
Initial Nutrition Assessment  DOCUMENTATION CODES:   Not applicable  INTERVENTION:  Provide Ensure Enlive po TID, each supplement provides 350 kcal and 20 grams of protein.  Encourage adequate PO intake.   NUTRITION DIAGNOSIS:   Inadequate oral intake related to decreased appetite as evidenced by meal completion < 50%.  GOAL:   Patient will meet greater than or equal to 90% of their needs  MONITOR:   PO intake, Supplement acceptance, Labs, Weight trends, Skin, I & O's  REASON FOR ASSESSMENT:   Consult Poor PO  ASSESSMENT:   87 year old female with history of hypertension, severely hearing impaired, breast cancer status postmastectomy 25 years ago, hypothyroidism, hypertension. Presented 11/19/2020 with aphasia. Small acute left MCA territory infarct involving inferior insula and adjacent temporal lobe. Underwent mechanical thrombectomy per interventional radiology with stenting spanning the left M1-M2 posterior division.  MRI of the head post stenting showed multifocal acute ischemic left MCA distribution infarct involving left parietal and temporal lobes. Therapy evaluations completed due to patient's aphasia decreased functional mobility was admitted for a comprehensive rehab program.  Meal completion has been varied from 0-25%. Pt reports no abdominal discomfort during time of visit. Pt does reports eating well PTA with no difficulties. Usual body weight unknown. RD to order nutritional supplements to aid in caloric and protein needs. Pt encouraged to eat her food at meals.   NUTRITION - FOCUSED PHYSICAL EXAM:  Flowsheet Row Most Recent Value  Orbital Region No depletion  Upper Arm Region No depletion  Thoracic and Lumbar Region No depletion  Buccal Region No depletion  Temple Region No depletion  Clavicle Bone Region No depletion  Clavicle and Acromion Bone Region No depletion  Scapular Bone Region Unable to assess  Dorsal Hand No depletion  Patellar Region No  depletion  Anterior Thigh Region No depletion  Edema (RD Assessment) Mild  Hair Reviewed  Eyes Reviewed  Mouth Reviewed  Skin Reviewed  Nails Reviewed      Labs and medications reviewed.   Diet Order:   Diet Order             Diet regular Room service appropriate? Yes; Fluid consistency: Thin  Diet effective now                   EDUCATION NEEDS:   Not appropriate for education at this time  Skin:  Skin Assessment: Reviewed RN Assessment  Last BM:  9/11  Height:   Ht Readings from Last 1 Encounters:  11/26/20 5\' 9"  (1.753 m)    Weight:   Wt Readings from Last 1 Encounters:  12/08/20 60.6 kg   BMI:  Body mass index is 19.73 kg/m.  Estimated Nutritional Needs:   Kcal:  1500-1700  Protein:  70-80 grams  Fluid:  >/= 1.5 L/day  02/07/21, MS, RD, LDN RD pager number/after hours weekend pager number on Amion.

## 2020-12-09 NOTE — Plan of Care (Signed)
  Problem: RH Ambulation Goal: LTG Patient will ambulate in controlled environment (PT) Description: LTG: Patient will ambulate in a controlled environment, # of feet with assistance (PT). Outcome: Not Applicable Note: Pt will not be a functional ambulator by time of DC Goal: LTG Patient will ambulate in home environment (PT) Description: LTG: Patient will ambulate in home environment, # of feet with assistance (PT). Outcome: Not Applicable Note: Pt will not be a functional ambulator by time of DC

## 2020-12-09 NOTE — Progress Notes (Signed)
Physical Therapy Session Note  Patient Details  Name: Maria Dyer MRN: [id number] Date of Birth: 09/01/1930  Today's Date: 12/09/2020 PT Individual Time: 1030-1056 PT Individual Time Calculation (min): 26 min   Short Term Goals: Week 2:  PT Short Term Goal 1 (Week 2): pt will complete bed mobility with CGA PT Short Term Goal 2 (Week 2): Pt will complete bed<>chair transfers with minA and LRAD PT Short Term Goal 3 (Week 2): Pt will ambulate 46ft with minA and LRAD  Skilled Therapeutic Interventions/Progress Updates:     Pt seen supine in bed at start - sleeping on arrival but awakens easily to voice. She's agreeable to PT tx. Complains of L mouth/tooth pain. Medical team is aware and provided rest breaks as needed for pain management. Completed supine there-ex and pt was guided for maximizing muscle activation, technique, and sequencing. (See exercises below). She was noted to be incontinent of bladder with saturated brief. Required totalA for brief change and rolled in bed with minA and use of bed rails. Donned shorts with maxA for threading over LE's and pulling over hips by rolling in bed. Assisted to supine with minA with HOB flat, assist for trunk elevation. Completed squat<>pivot transfer with modA to w/c - pt very fearful and anxious with mobiltiy impacting her performance. Wheeled to face the side bed rails and completed 1x5 sit<>stand transfers with minA. She frequently would state "I can't." Or "tomorrow." But with encouragement was able to do activities. Ended session seated in w/c with safety belt alarm on and all needs within reach.    Therapy Documentation Precautions:  Precautions Precautions: Fall Precaution Comments: receptive > expressive aphasia, HoH Restrictions Weight Bearing Restrictions: No General:   Exercises: Total Joint Exercises Gluteal Sets: 10 reps Heel Slides: 10 reps Hip ABduction/ADduction: 10 reps Straight Leg Raises: 10 reps    Therapy/Group:  Individual Therapy  Siona Coulston P Jeno Calleros PT 12/09/2020, 10:44 AM

## 2020-12-09 NOTE — Progress Notes (Signed)
Occupational Therapy Session Note  Patient Details  Name: Maria Dyer MRN: [id number] Date of Birth: 1930-10-17  Today's Date: 12/09/2020 OT Individual Time: 1135-1200 OT Individual Time Calculation (min): 25 min    Short Term Goals: Week 2:  OT Short Term Goal 1 (Week 2): pt will stand with unilateral support for ~ 30 seconds as precursor to LB ADLS OT Short Term Goal 2 (Week 2): pt will complete 2/3 toileting tasks with MOD A OT Short Term Goal 3 (Week 2): pt will don LB clothing with MOD A  Skilled Therapeutic Interventions/Progress Updates:    Pt received in wc watching TV.  Attempted multiple times using her communication board to have pt engage in a standing activity or to work on transfers but pt kept saying "no thank you, not today"  She was willing to engage in a puzzle activity in which she had to reach forward for the pieces. She needed 90% A with puzzle but when given  1 piece at a time, pt able to find how to position it when location of piece pointed out to her. Pt continuing to rest in wc with all needs met.  Therapy Documentation Precautions:  Precautions Precautions: Fall Precaution Comments: receptive > expressive aphasia, HoH Restrictions Weight Bearing Restrictions: No    Vital Signs: Therapy Vitals Temp: 97.6 F (36.4 C) Temp Source: Oral Pulse Rate: 71 Resp: 14 BP: (!) 150/59 Patient Position (if appropriate): Lying Oxygen Therapy SpO2: 100 % O2 Device: Room Air Pain:   ADL: ADL Eating: Supervision/safety Where Assessed-Eating: Other (comment) Grooming: Minimal assistance Where Assessed-Grooming: Sitting at sink Upper Body Bathing: Minimal assistance Where Assessed-Upper Body Bathing: Sitting at sink, Wheelchair Lower Body Bathing: Dependent Where Assessed-Lower Body Bathing: Sitting at sink Upper Body Dressing: Minimal assistance Where Assessed-Upper Body Dressing: Sitting at sink, Wheelchair Lower Body Dressing: Dependent Where  Assessed-Lower Body Dressing: Standing at sink Toileting: Dependent Where Assessed-Toileting: Bedside Commode Toilet Transfer: Maximal verbal cueing, Dependent Toilet Transfer Method: (P) Stand pivot Toilet Transfer Equipment: Bedside commode Tub/Shower Transfer: Unable to assess Gaffer Transfer: Unable to assess   Therapy/Group: Individual Therapy  [place] 12/09/2020, 8:31 AM

## 2020-12-09 NOTE — Discharge Summary (Signed)
Physician Discharge Summary  Patient ID: [name] MRN: [id number] DOB/AGE: 07-02-1930 87 y.o.  Admit date: 11/25/2020 Discharge date: 12/12/2020  Discharge Diagnoses:  Principal Problem:   Left middle cerebral artery stroke [facility]) DVT prophylaxis Hyperlipidemia Hypothyroidism Hypertension MASD buttocks and groin AKI Hearing impaired Breast cancer status post mastectomy UTI  Discharged Condition: Stable  Significant Diagnostic Studies: DG Chest 1 View  Result Date: 11/24/2020 CLINICAL DATA:  Aspiration into airway. EXAM: CHEST  1 VIEW COMPARISON:  None. FINDINGS: Scoliotic curvature of the thoracolumbar spine. No pneumothorax. Healed left rib fractures. The heart, hila, and mediastinum are unremarkable with no acute abnormalities. Calcified atherosclerosis is seen in the thoracic aorta. No pulmonary nodules or masses. No focal infiltrates. No acute abnormalities. IMPRESSION: No active disease. Electronically Signed   By: Dorise Bullion III M.D.   On: 11/24/2020 13:36   CT HEAD WO CONTRAST  Result Date: 11/19/2020 CLINICAL DATA:  Stroke, follow up Left M2 occlusion status post M1-M2 stenting. EXAM: CT HEAD WITHOUT CONTRAST TECHNIQUE: Contiguous axial images were obtained from the base of the skull through the vertex without intravenous contrast. COMPARISON:  Same day CT head. FINDINGS: Motion limited study.  Within this limitation: Brain: Similar mild hypoattenuation and possible loss of grey/white differentiation in the inferior left insula with possibly slightly increased hypoattenuation in the anterior left temporal lobe, although streak artifact limits evaluation. No evidence of acute hemorrhage, hydrocephalus, mass lesion, midline shift, or visible extra-axial collection. Vascular: Status post endovascular stenting of the left MCA. Residual contrast in the vasculature. Skull: No acute fracture. Sinuses/Orbits: Mild paranasal sinus mucosal thickening. No acute renal findings.  Other: No mastoid effusions. IMPRESSION: 1. Status post endovascular stenting of the left MCA. Possibly slight increase in edema/hypoattenuation in the anterior left temporal lobe, although streak limits evaluation in this region. If the patient is able, an MRI could better evaluate for acute infarct. 2. No evidence of acute hemorrhage. Electronically Signed   By: Margaretha Sheffield M.D.   On: 11/19/2020 20:06   MR ANGIO HEAD WO CONTRAST  Result Date: 11/20/2020 CLINICAL DATA:  Follow-up examination for acute stroke, left M2 occlusion, status post thrombectomy and stenting. EXAM: MRI HEAD WITHOUT CONTRAST MRA HEAD WITHOUT CONTRAST TECHNIQUE: Multiplanar, multi-echo pulse sequences of the brain and surrounding structures were acquired without intravenous contrast. Angiographic images of the Circle of Willis were acquired using MRA technique without intravenous contrast. COMPARISON:  Prior CTs from earlier the same day. FINDINGS: MRI HEAD FINDINGS Brain: Generalized age-related cerebral atrophy. Mild chronic microvascular ischemic disease noted involving the periventricular white matter. Remote lacunar infarct present at the posterior right lentiform nucleus/internal capsule. Few small remote bilateral cerebellar infarcts. Patchy restricted diffusion seen involving the left parietal and temporal lobes, consistent with acute left MCA distribution infarct (series 5, images 83, 74). Involvement is primarily cortical in nature, although there are some patchy subcortical involvement. Minimal associated petechial hemorrhage without frank intraparenchymal hematoma (series 14, image 33). No significant regional mass effect. No other evidence for acute or subacute ischemia. Gray-white matter differentiation otherwise maintained. No other acute or chronic intracranial hemorrhage elsewhere within the brain. No mass lesion, midline shift or mass effect. No hydrocephalus or extra-axial fluid collection. Pituitary gland  suprasellar region normal. Midline structures intact. Vascular: Major intracranial vascular flow voids are maintained. Susceptibility artifact related to stenting noted at the left M1/M2 segments. Skull and upper cervical spine: Craniocervical junction within normal limits. Bone marrow signal intensity normal. No scalp soft tissue abnormality. Sinuses/Orbits: Globes and  orbital soft tissues demonstrate no acute finding. Axial myopia noted. Scattered mucosal thickening noted throughout the paranasal sinuses. No air-fluid levels to suggest acute sinusitis. Moderate right with small left mastoid effusions. Visualized nasopharynx unremarkable. Other: 7 mm T2 hyperintense lesion seen within the right parotid gland (series 17, image 17), indeterminate. MRA HEAD FINDINGS Anterior circulation: Examination degraded by my a shin artifact. Distal cervical ICAs remain widely patent antegrade flow. Petrous segments widely patent. Atheromatous irregularity throughout the carotid siphons without high-grade stenosis. A1 segments, anterior communicating artery complex common anterior cerebral arteries remain patent without definite stenosis. Right M1 segment and distal right MCA branches well perfused and widely patent. Attenuated but patent flow within the proximal left M1 segment. Susceptibility artifact related to a vascular stent spanning the left M1-M2 segments. Flow through the stent itself is not assessed by MRA. Markedly attenuated flow seen beyond the stent within the distal left MCA branches. Posterior circulation: Both vertebral arteries remain patent to the vertebrobasilar junction. Both PICA remain patent. Basilar diminutive but patent to its distal aspect without stenosis. Superior cerebral arteries patent bilaterally. Fetal type origin of both PCAs, both of which remain patent to their distal aspects. Anatomic variants: Fetal type origin of the PCAs with overall diminutive vertebrobasilar system. No intracranial  aneurysm. IMPRESSION: MRI HEAD IMPRESSION: 1. Multifocal acute ischemic left MCA distribution infarcts involving the left parietal and temporal lobes. Minimal associated petechial hemorrhage without frank intraparenchymal hematoma or mass effect. 2. No other acute intracranial abnormality. 3. Underlying age-related cerebral atrophy with mild chronic small vessel ischemic disease, with remote lacunar infarcts involving the right lentiform nucleus/internal capsule, and bilateral cerebellar hemispheres. MRA HEAD IMPRESSION: 1. Technically limited exam due to motion artifact. 2. Interval placement of a vascular stent spanning the left M1-M2 segments. Flow within the stent itself is not assessed by MRA, obscured by susceptibility artifact. Markedly attenuated and reduced flow seen beyond the stent within the left MCA branches distally, raising the possibility for intra stent stenosis or other pathology. Finding could be further assessed with dedicated CTA or repeat arteriogram as warranted. 3. Otherwise stable intracranial MRA, with no other new or progressive finding. 4. Fetal type origin of the PCAs with overall diminutive vertebrobasilar system. Electronically Signed   By: Jeannine Boga M.D.   On: 11/20/2020 02:32   MR BRAIN WO CONTRAST  Result Date: 11/20/2020 CLINICAL DATA:  Follow-up examination for acute stroke, left M2 occlusion, status post thrombectomy and stenting. EXAM: MRI HEAD WITHOUT CONTRAST MRA HEAD WITHOUT CONTRAST TECHNIQUE: Multiplanar, multi-echo pulse sequences of the brain and surrounding structures were acquired without intravenous contrast. Angiographic images of the Circle of Willis were acquired using MRA technique without intravenous contrast. COMPARISON:  Prior CTs from earlier the same day. FINDINGS: MRI HEAD FINDINGS Brain: Generalized age-related cerebral atrophy. Mild chronic microvascular ischemic disease noted involving the periventricular white matter. Remote lacunar infarct  present at the posterior right lentiform nucleus/internal capsule. Few small remote bilateral cerebellar infarcts. Patchy restricted diffusion seen involving the left parietal and temporal lobes, consistent with acute left MCA distribution infarct (series 5, images 83, 74). Involvement is primarily cortical in nature, although there are some patchy subcortical involvement. Minimal associated petechial hemorrhage without frank intraparenchymal hematoma (series 14, image 33). No significant regional mass effect. No other evidence for acute or subacute ischemia. Gray-white matter differentiation otherwise maintained. No other acute or chronic intracranial hemorrhage elsewhere within the brain. No mass lesion, midline shift or mass effect. No hydrocephalus or extra-axial fluid collection. Pituitary  gland suprasellar region normal. Midline structures intact. Vascular: Major intracranial vascular flow voids are maintained. Susceptibility artifact related to stenting noted at the left M1/M2 segments. Skull and upper cervical spine: Craniocervical junction within normal limits. Bone marrow signal intensity normal. No scalp soft tissue abnormality. Sinuses/Orbits: Globes and orbital soft tissues demonstrate no acute finding. Axial myopia noted. Scattered mucosal thickening noted throughout the paranasal sinuses. No air-fluid levels to suggest acute sinusitis. Moderate right with small left mastoid effusions. Visualized nasopharynx unremarkable. Other: 7 mm T2 hyperintense lesion seen within the right parotid gland (series 17, image 17), indeterminate. MRA HEAD FINDINGS Anterior circulation: Examination degraded by my a shin artifact. Distal cervical ICAs remain widely patent antegrade flow. Petrous segments widely patent. Atheromatous irregularity throughout the carotid siphons without high-grade stenosis. A1 segments, anterior communicating artery complex common anterior cerebral arteries remain patent without definite  stenosis. Right M1 segment and distal right MCA branches well perfused and widely patent. Attenuated but patent flow within the proximal left M1 segment. Susceptibility artifact related to a vascular stent spanning the left M1-M2 segments. Flow through the stent itself is not assessed by MRA. Markedly attenuated flow seen beyond the stent within the distal left MCA branches. Posterior circulation: Both vertebral arteries remain patent to the vertebrobasilar junction. Both PICA remain patent. Basilar diminutive but patent to its distal aspect without stenosis. Superior cerebral arteries patent bilaterally. Fetal type origin of both PCAs, both of which remain patent to their distal aspects. Anatomic variants: Fetal type origin of the PCAs with overall diminutive vertebrobasilar system. No intracranial aneurysm. IMPRESSION: MRI HEAD IMPRESSION: 1. Multifocal acute ischemic left MCA distribution infarcts involving the left parietal and temporal lobes. Minimal associated petechial hemorrhage without frank intraparenchymal hematoma or mass effect. 2. No other acute intracranial abnormality. 3. Underlying age-related cerebral atrophy with mild chronic small vessel ischemic disease, with remote lacunar infarcts involving the right lentiform nucleus/internal capsule, and bilateral cerebellar hemispheres. MRA HEAD IMPRESSION: 1. Technically limited exam due to motion artifact. 2. Interval placement of a vascular stent spanning the left M1-M2 segments. Flow within the stent itself is not assessed by MRA, obscured by susceptibility artifact. Markedly attenuated and reduced flow seen beyond the stent within the left MCA branches distally, raising the possibility for intra stent stenosis or other pathology. Finding could be further assessed with dedicated CTA or repeat arteriogram as warranted. 3. Otherwise stable intracranial MRA, with no other new or progressive finding. 4. Fetal type origin of the PCAs with overall diminutive  vertebrobasilar system. Electronically Signed   By: Jeannine Boga M.D.   On: 11/20/2020 02:32   IR Intra Cran Stent  Result Date: 11/20/2020 INDICATION: 87 year old female with past medical history significant essential hypertension, severe hearing impairment, breast cancer s/p mastectomy, thoracolumbar scoliosis, and hypothyroidism. She presents with global aphasia, NIHSS 6. Her last known well was 2:18 p.m. on 11/18/2020. Her baseline modified Rankin scale is 1. Head CT showed a small left MCA territory infarct (ASPECTS 8). CT angiogram of the head and neck showed a left M2/MCA occlusion. CT perfusion showed a 5 mL core infarct with 44 mL penumbra. She was taken to our service for a diagnostic cerebral angiogram and mechanical thrombectomy. EXAM: ULTRASOUND-GUIDED VASCULAR ACCESS DIAGNOSTIC CEREBRAL ANGIOGRAM MECHANICAL THROMBECTOMY INTRACRANIAL STENTING FLAT PANEL HEAD CT COMPARISON:  CT/CT angiogram of the head and neck November 19, 2020 MEDICATIONS: Intravenous cangrelor bolus and drip ANESTHESIA/SEDATION: The procedure was performed under general anesthesia. CONTRAST:  130 mL of Omnipaque 240 milligram/mL FLUOROSCOPY TIME:  Fluoroscopy Time: 27 minutes 12 seconds (752 mGy). COMPLICATIONS: None immediate. TECHNIQUE: Informed written consent was obtained from the patient's niece after a thorough discussion of the procedural risks, benefits and alternatives. All questions were addressed. Maximal Sterile Barrier Technique was utilized including caps, mask, sterile gowns, sterile gloves, sterile drape, hand hygiene and skin antiseptic. A timeout was performed prior to the initiation of the procedure. The left groin was prepped and draped in the usual sterile fashion. Using a micropuncture kit and the modified Seldinger technique, access was gained to the left common femoral artery and an 8 French sheath was placed. Real-time ultrasound guidance was utilized for vascular access including the acquisition of  a permanent ultrasound image documenting patency of the accessed vessel. Under fluoroscopy, an 8 [place] Walrus balloon guide catheter was navigated over a 6 [place] Berenstein 2 catheter and a 0.035" Terumo Glidewire into the aortic arch. The catheter was placed into the left common carotid artery and then advanced into the left internal carotid artery. The inner catheter was removed. Frontal and lateral angiograms of the head were obtained. FINDINGS: 1. Atherosclerotic changes are noted in the left common femoral artery which has adequate caliber for vascular access. 2. Severe stenosis at the distal left M1/MCA with occlusion of the anterior temporal branch which shows mildly delayed retrograde opacification. There is low distal opacification of the remainder of the left MCA vascular tree. PROCEDURE: Under biplane roadmap, a zoom 71 aspiration catheter was navigated over an Aristotle 14 microguidewire into the cavernous segment of the right ICA. The aspiration catheter was then advanced to the level of occlusion/near occlusion and connected to an aspiration pump. Continuous aspiration was performed for 3 minutes. The guide catheter balloon was inflated and the catheter was connected to a VacLok syringe. The aspiration catheter was subsequently removed under constant aspiration. The guide catheter was aspirated for debris. Left ICA angiograms with frontal and lateral views of the head showed improved anterograde flow the left MCA vascular tree with persistent severe stenosis. Under biplane roadmap, a zoom 71 aspiration catheter was navigated over an Aristotle 14 microguidewire into the cavernous segment of the right ICA. The aspiration catheter was then advanced to the level of occlusion/near occlusion and connected to an aspiration pump. Continuous aspiration was performed for 3 minutes. The guide catheter balloon was inflated and the catheter was connected to a VacLok syringe. The aspiration catheter was  subsequently removed under constant aspiration. The guide catheter was aspirated for debris. Left ICA angiograms with frontal and lateral views of the head showed persistent severe stenosis. Patient was loaded on cangrelor and drip was initiated. Under biplane roadmap, a zoom 71 aspiration catheter was navigated over a phenom 21 microcatheter and a synchro support microguidewire into the cavernous segment of the right ICA. The microcatheter was then navigated over the wire into the left M2/MCA posterior division branch. Then, a 4.5 x 30 mm neural form atlas stent was deployed spanning the left M1 and M2/MCA posterior division branch. Left internal carotid artery angiogram showed adequate stent position across the left MCA stenosis with mild residual stenosis. Delayed left internal carotid artery angiograms with frontal and lateral views of the head showed further improvement of the degree of stenosis with improvement of the anterograde flow and no evidence of in stent thrombosis. Flat panel CT of the head was obtained and post processed in a separate workstation with concurrent attending physician supervision. Selected images were sent to PACS. No evidence of hemorrhagic complication. A left common  femoral artery angiogram was obtained with left anterior oblique views. The puncture is at the common femoral artery which has adequate caliber for closure device. The femoral sheath was exchanged over the wire for a Perclose pro style which was utilized for access closure. Immediate hemostasis was achieved. 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 Rescue stenting performed with minimal residual stenosis. No hemorrhagic complication postprocedural flat panel head CT. PLAN: Continue on cangrelor drip until head CT is obtained within 3-1/2 hours. At this point, patient may be loaded on ticagrelor  and aspirin if CT shows no evidence of hemorrhagic complication. Electronically Signed   By: Pedro Earls M.D.   On: 11/20/2020 13:13   IR CT Head Ltd  Result Date: 11/20/2020 INDICATION: 87 year old female with past medical history significant essential hypertension, severe hearing impairment, breast cancer s/p mastectomy, thoracolumbar scoliosis, and hypothyroidism. She presents with global aphasia, NIHSS 6. Her last known well was 2:18 p.m. on 11/18/2020. Her baseline modified Rankin scale is 1. Head CT showed a small left MCA territory infarct (ASPECTS 8). CT angiogram of the head and neck showed a left M2/MCA occlusion. CT perfusion showed a 5 mL core infarct with 44 mL penumbra. She was taken to our service for a diagnostic cerebral angiogram and mechanical thrombectomy. EXAM: ULTRASOUND-GUIDED VASCULAR ACCESS DIAGNOSTIC CEREBRAL ANGIOGRAM MECHANICAL THROMBECTOMY INTRACRANIAL STENTING FLAT PANEL HEAD CT COMPARISON:  CT/CT angiogram of the head and neck November 19, 2020 MEDICATIONS: Intravenous cangrelor bolus and drip ANESTHESIA/SEDATION: The procedure was performed under general anesthesia. CONTRAST:  130 mL of Omnipaque 240 milligram/mL FLUOROSCOPY TIME:  Fluoroscopy Time: 27 minutes 12 seconds (752 mGy). COMPLICATIONS: None immediate. TECHNIQUE: Informed written consent was obtained from the patient's niece after a thorough discussion of the procedural risks, benefits and alternatives. All questions were addressed. Maximal Sterile Barrier Technique was utilized including caps, mask, sterile gowns, sterile gloves, sterile drape, hand hygiene and skin antiseptic. A timeout was performed prior to the initiation of the procedure. The left groin was prepped and draped in the usual sterile fashion. Using a micropuncture kit and the modified Seldinger technique, access was gained to the left common femoral artery and an 8 French sheath was placed. Real-time ultrasound guidance was utilized for  vascular access including the acquisition of a permanent ultrasound image documenting patency of the accessed vessel. Under fluoroscopy, an 8 [place] Walrus balloon guide catheter was navigated over a 6 [place] Berenstein 2 catheter and a 0.035" Terumo Glidewire into the aortic arch. The catheter was placed into the left common carotid artery and then advanced into the left internal carotid artery. The inner catheter was removed. Frontal and lateral angiograms of the head were obtained. FINDINGS: 1. Atherosclerotic changes are noted in the left common femoral artery which has adequate caliber for vascular access. 2. Severe stenosis at the distal left M1/MCA with occlusion of the anterior temporal branch which shows mildly delayed retrograde opacification. There is low distal opacification of the remainder of the left MCA vascular tree. PROCEDURE: Under biplane roadmap, a zoom 71 aspiration catheter was navigated over an Aristotle 14 microguidewire into the cavernous segment of the right ICA. The aspiration catheter was then advanced to the level of occlusion/near occlusion and connected to an aspiration pump. Continuous aspiration was performed for 3 minutes. The guide catheter balloon was inflated and the catheter was connected to a VacLok syringe. The aspiration catheter was subsequently removed  under constant aspiration. The guide catheter was aspirated for debris. Left ICA angiograms with frontal and lateral views of the head showed improved anterograde flow the left MCA vascular tree with persistent severe stenosis. Under biplane roadmap, a zoom 71 aspiration catheter was navigated over an Aristotle 14 microguidewire into the cavernous segment of the right ICA. The aspiration catheter was then advanced to the level of occlusion/near occlusion and connected to an aspiration pump. Continuous aspiration was performed for 3 minutes. The guide catheter balloon was inflated and the catheter was connected to a VacLok  syringe. The aspiration catheter was subsequently removed under constant aspiration. The guide catheter was aspirated for debris. Left ICA angiograms with frontal and lateral views of the head showed persistent severe stenosis. Patient was loaded on cangrelor and drip was initiated. Under biplane roadmap, a zoom 71 aspiration catheter was navigated over a phenom 21 microcatheter and a synchro support microguidewire into the cavernous segment of the right ICA. The microcatheter was then navigated over the wire into the left M2/MCA posterior division branch. Then, a 4.5 x 30 mm neural form atlas stent was deployed spanning the left M1 and M2/MCA posterior division branch. Left internal carotid artery angiogram showed adequate stent position across the left MCA stenosis with mild residual stenosis. Delayed left internal carotid artery angiograms with frontal and lateral views of the head showed further improvement of the degree of stenosis with improvement of the anterograde flow and no evidence of in stent thrombosis. Flat panel CT of the head was obtained and post processed in a separate workstation with concurrent attending physician supervision. Selected images were sent to PACS. No evidence of hemorrhagic complication. A left common femoral artery angiogram was obtained with left anterior oblique views. The puncture is at the common femoral artery which has adequate caliber for closure device. The femoral sheath was exchanged over the wire for a Perclose pro style which was utilized for access closure. Immediate hemostasis was achieved. 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 Rescue stenting performed with minimal residual stenosis. No hemorrhagic complication postprocedural flat panel head CT. PLAN: Continue on cangrelor drip until head CT is obtained within 3-1/2 hours. At this point,  patient may be loaded on ticagrelor and aspirin if CT shows no evidence of hemorrhagic complication. Electronically Signed   By: Pedro Earls M.D.   On: 11/20/2020 13:13   IR US Guide Vasc Access Left  Result Date: 11/20/2020 INDICATION: 87 year old female with past medical history significant essential hypertension, severe hearing impairment, breast cancer s/p mastectomy, thoracolumbar scoliosis, and hypothyroidism. She presents with global aphasia, NIHSS 6. Her last known well was 2:18 p.m. on 11/18/2020. Her baseline modified Rankin scale is 1. Head CT showed a small left MCA territory infarct (ASPECTS 8). CT angiogram of the head and neck showed a left M2/MCA occlusion. CT perfusion showed a 5 mL core infarct with 44 mL penumbra. She was taken to our service for a diagnostic cerebral angiogram and mechanical thrombectomy. EXAM: ULTRASOUND-GUIDED VASCULAR ACCESS DIAGNOSTIC CEREBRAL ANGIOGRAM MECHANICAL THROMBECTOMY INTRACRANIAL STENTING FLAT PANEL HEAD CT COMPARISON:  CT/CT angiogram of the head and neck November 19, 2020 MEDICATIONS: Intravenous cangrelor bolus and drip ANESTHESIA/SEDATION: The procedure was performed under general anesthesia. CONTRAST:  130 mL of Omnipaque 240 milligram/mL FLUOROSCOPY TIME:  Fluoroscopy Time: 27 minutes 12 seconds (752 mGy). COMPLICATIONS: None immediate. TECHNIQUE: Informed written consent was obtained from the patient's  niece after a thorough discussion of the procedural risks, benefits and alternatives. All questions were addressed. Maximal Sterile Barrier Technique was utilized including caps, mask, sterile gowns, sterile gloves, sterile drape, hand hygiene and skin antiseptic. A timeout was performed prior to the initiation of the procedure. The left groin was prepped and draped in the usual sterile fashion. Using a micropuncture kit and the modified Seldinger technique, access was gained to the left common femoral artery and an 8 French sheath was  placed. Real-time ultrasound guidance was utilized for vascular access including the acquisition of a permanent ultrasound image documenting patency of the accessed vessel. Under fluoroscopy, an 8 [place] Walrus balloon guide catheter was navigated over a 6 [place] Berenstein 2 catheter and a 0.035" Terumo Glidewire into the aortic arch. The catheter was placed into the left common carotid artery and then advanced into the left internal carotid artery. The inner catheter was removed. Frontal and lateral angiograms of the head were obtained. FINDINGS: 1. Atherosclerotic changes are noted in the left common femoral artery which has adequate caliber for vascular access. 2. Severe stenosis at the distal left M1/MCA with occlusion of the anterior temporal branch which shows mildly delayed retrograde opacification. There is low distal opacification of the remainder of the left MCA vascular tree. PROCEDURE: Under biplane roadmap, a zoom 71 aspiration catheter was navigated over an Aristotle 14 microguidewire into the cavernous segment of the right ICA. The aspiration catheter was then advanced to the level of occlusion/near occlusion and connected to an aspiration pump. Continuous aspiration was performed for 3 minutes. The guide catheter balloon was inflated and the catheter was connected to a VacLok syringe. The aspiration catheter was subsequently removed under constant aspiration. The guide catheter was aspirated for debris. Left ICA angiograms with frontal and lateral views of the head showed improved anterograde flow the left MCA vascular tree with persistent severe stenosis. Under biplane roadmap, a zoom 71 aspiration catheter was navigated over an Aristotle 14 microguidewire into the cavernous segment of the right ICA. The aspiration catheter was then advanced to the level of occlusion/near occlusion and connected to an aspiration pump. Continuous aspiration was performed for 3 minutes. The guide catheter balloon was  inflated and the catheter was connected to a VacLok syringe. The aspiration catheter was subsequently removed under constant aspiration. The guide catheter was aspirated for debris. Left ICA angiograms with frontal and lateral views of the head showed persistent severe stenosis. Patient was loaded on cangrelor and drip was initiated. Under biplane roadmap, a zoom 71 aspiration catheter was navigated over a phenom 21 microcatheter and a synchro support microguidewire into the cavernous segment of the right ICA. The microcatheter was then navigated over the wire into the left M2/MCA posterior division branch. Then, a 4.5 x 30 mm neural form atlas stent was deployed spanning the left M1 and M2/MCA posterior division branch. Left internal carotid artery angiogram showed adequate stent position across the left MCA stenosis with mild residual stenosis. Delayed left internal carotid artery angiograms with frontal and lateral views of the head showed further improvement of the degree of stenosis with improvement of the anterograde flow and no evidence of in stent thrombosis. Flat panel CT of the head was obtained and post processed in a separate workstation with concurrent attending physician supervision. Selected images were sent to PACS. No evidence of hemorrhagic complication. A left common femoral artery angiogram was obtained with left anterior oblique views. The puncture is at the common femoral artery which has  adequate caliber for closure device. The femoral sheath was exchanged over the wire for a Perclose pro style which was utilized for access closure. Immediate hemostasis was achieved. 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 Rescue stenting performed with minimal residual stenosis. No hemorrhagic complication postprocedural flat panel head CT. PLAN: Continue on cangrelor drip until  head CT is obtained within 3-1/2 hours. At this point, patient may be loaded on ticagrelor and aspirin if CT shows no evidence of hemorrhagic complication. Electronically Signed   By: Pedro Earls M.D.   On: 11/20/2020 13:13   ECHOCARDIOGRAM COMPLETE  Result Date: 11/20/2020    ECHOCARDIOGRAM REPORT   Patient Name:   SHALONDRA WUNSCHEL Date of Exam: 11/20/2020 Medical Rec #:  [id number]       Height: Accession #:    [id number]      Weight:       140.7 lb Date of Birth:  1930/08/14       BSA:          1.597 m Patient Age:    64 years        BP:           153/116 mmHg Patient Gender: F               HR:           87 bpm. Exam Location:  Inpatient Procedure: 2D Echo, Color Doppler and Cardiac Doppler Indications:    Stroke i63.9  History:        Patient has no prior history of Echocardiogram examinations.                 Risk Factors:Hypertension.  Sonographer:    Raquel Sarna Senior RDCS Referring Phys: [id number] Rikki Spearing  Sonographer Comments: Difficult study, patient is confused, pulling on clothes and wires, exam truncated at the end due to patient pushing probe away. IMPRESSIONS  1. Left ventricular ejection fraction, by estimation, is 60 to 65%. The left ventricle has normal function. The left ventricle has no regional wall motion abnormalities. Left ventricular diastolic parameters are indeterminate. Elevated left ventricular end-diastolic pressure.  2. Right ventricular systolic function is normal. The right ventricular size is normal.  3. The mitral valve is grossly normal. Trivial mitral valve regurgitation. Moderate mitral annular calcification.  4. The aortic valve is grossly normal. Aortic valve regurgitation is not visualized. FINDINGS  Left Ventricle: Left ventricular ejection fraction, by estimation, is 60 to 65%. The left ventricle has normal function. The left ventricle has no regional wall motion abnormalities. The left ventricular internal cavity size was normal in size. There is  no  left ventricular hypertrophy. Left ventricular diastolic parameters are indeterminate. Elevated left ventricular end-diastolic pressure. Right Ventricle: The right ventricular size is normal. Right vetricular wall thickness was not well visualized. Right ventricular systolic function is normal. Left Atrium: Left atrial size was normal in size. Right Atrium: Right atrial size was normal in size. Pericardium: There is no evidence of pericardial effusion. Mitral Valve: The mitral valve is grossly normal. Moderate mitral annular calcification. Trivial mitral valve regurgitation. Tricuspid Valve: The tricuspid valve is not well visualized. Tricuspid valve regurgitation is not demonstrated. Aortic Valve: The aortic valve is grossly normal. Aortic valve regurgitation is not visualized. Pulmonic Valve: The pulmonic valve was grossly normal. Pulmonic valve regurgitation is not visualized. Aorta: The aortic root and ascending aorta are structurally  normal, with no evidence of dilitation. IAS/Shunts: The atrial septum is grossly normal.  LEFT VENTRICLE PLAX 2D LVIDd:         4.70 cm  Diastology LVIDs:         2.60 cm  LV e' medial:    7.62 cm/s LV PW:         0.80 cm  LV E/e' medial:  15.1 LV IVS:        0.50 cm  LV e' lateral:   7.07 cm/s LVOT diam:     1.70 cm  LV E/e' lateral: 16.3 LV SV:         45 LV SV Index:   28 LVOT Area:     2.27 cm  RIGHT VENTRICLE TAPSE (M-mode): 2.0 cm LEFT ATRIUM             Index       RIGHT ATRIUM           Index LA diam:        3.80 cm 2.38 cm/m  RA Area:     12.90 cm LA Vol (A2C):   34.5 ml 21.60 ml/m RA Volume:   30.50 ml  19.10 ml/m LA Vol (A4C):   46.4 ml 29.06 ml/m LA Biplane Vol: 43.7 ml 27.37 ml/m  AORTIC VALVE LVOT Vmax:   84.20 cm/s LVOT Vmean:  58.900 cm/s LVOT VTI:    0.199 m  AORTA Ao Root diam: 2.60 cm MITRAL VALVE MV Area (PHT): 5.54 cm     SHUNTS MV Decel Time: 137 msec     Systemic VTI:  0.20 m MV E velocity: 115.00 cm/s  Systemic Diam: 1.70 cm MV A velocity: 103.00  cm/s MV E/A ratio:  1.12 Mertie Moores MD Electronically signed by Mertie Moores MD Signature Date/Time: 11/20/2020/11:45:17 AM    Final    IR PERCUTANEOUS ART THROMBECTOMY/INFUSION INTRACRANIAL INC DIAG ANGIO  Result Date: 11/20/2020 INDICATION: 87 year old female with past medical history significant essential hypertension, severe hearing impairment, breast cancer s/p mastectomy, thoracolumbar scoliosis, and hypothyroidism. She presents with global aphasia, NIHSS 6. Her last known well was 2:18 p.m. on 11/18/2020. Her baseline modified Rankin scale is 1. Head CT showed a small left MCA territory infarct (ASPECTS 8). CT angiogram of the head and neck showed a left M2/MCA occlusion. CT perfusion showed a 5 mL core infarct with 44 mL penumbra. She was taken to our service for a diagnostic cerebral angiogram and mechanical thrombectomy. EXAM: ULTRASOUND-GUIDED VASCULAR ACCESS DIAGNOSTIC CEREBRAL ANGIOGRAM MECHANICAL THROMBECTOMY INTRACRANIAL STENTING FLAT PANEL HEAD CT COMPARISON:  CT/CT angiogram of the head and neck November 19, 2020 MEDICATIONS: Intravenous cangrelor bolus and drip ANESTHESIA/SEDATION: The procedure was performed under general anesthesia. CONTRAST:  130 mL of Omnipaque 240 milligram/mL FLUOROSCOPY TIME:  Fluoroscopy Time: 27 minutes 12 seconds (752 mGy). COMPLICATIONS: None immediate. TECHNIQUE: Informed written consent was obtained from the patient's niece after a thorough discussion of the procedural risks, benefits and alternatives. All questions were addressed. Maximal Sterile Barrier Technique was utilized including caps, mask, sterile gowns, sterile gloves, sterile drape, hand hygiene and skin antiseptic. A timeout was performed prior to the initiation of the procedure. The left groin was prepped and draped in the usual sterile fashion. Using a micropuncture kit and the modified Seldinger technique, access was gained to the left common femoral artery and an 8 French sheath was placed.  Real-time ultrasound guidance was utilized for vascular access including the acquisition of a permanent ultrasound image documenting patency of the  accessed vessel. Under fluoroscopy, an 8 [place] Walrus balloon guide catheter was navigated over a 6 [place] Berenstein 2 catheter and a 0.035" Terumo Glidewire into the aortic arch. The catheter was placed into the left common carotid artery and then advanced into the left internal carotid artery. The inner catheter was removed. Frontal and lateral angiograms of the head were obtained. FINDINGS: 1. Atherosclerotic changes are noted in the left common femoral artery which has adequate caliber for vascular access. 2. Severe stenosis at the distal left M1/MCA with occlusion of the anterior temporal branch which shows mildly delayed retrograde opacification. There is low distal opacification of the remainder of the left MCA vascular tree. PROCEDURE: Under biplane roadmap, a zoom 71 aspiration catheter was navigated over an Aristotle 14 microguidewire into the cavernous segment of the right ICA. The aspiration catheter was then advanced to the level of occlusion/near occlusion and connected to an aspiration pump. Continuous aspiration was performed for 3 minutes. The guide catheter balloon was inflated and the catheter was connected to a VacLok syringe. The aspiration catheter was subsequently removed under constant aspiration. The guide catheter was aspirated for debris. Left ICA angiograms with frontal and lateral views of the head showed improved anterograde flow the left MCA vascular tree with persistent severe stenosis. Under biplane roadmap, a zoom 71 aspiration catheter was navigated over an Aristotle 14 microguidewire into the cavernous segment of the right ICA. The aspiration catheter was then advanced to the level of occlusion/near occlusion and connected to an aspiration pump. Continuous aspiration was performed for 3 minutes. The guide catheter balloon was  inflated and the catheter was connected to a VacLok syringe. The aspiration catheter was subsequently removed under constant aspiration. The guide catheter was aspirated for debris. Left ICA angiograms with frontal and lateral views of the head showed persistent severe stenosis. Patient was loaded on cangrelor and drip was initiated. Under biplane roadmap, a zoom 71 aspiration catheter was navigated over a phenom 21 microcatheter and a synchro support microguidewire into the cavernous segment of the right ICA. The microcatheter was then navigated over the wire into the left M2/MCA posterior division branch. Then, a 4.5 x 30 mm neural form atlas stent was deployed spanning the left M1 and M2/MCA posterior division branch. Left internal carotid artery angiogram showed adequate stent position across the left MCA stenosis with mild residual stenosis. Delayed left internal carotid artery angiograms with frontal and lateral views of the head showed further improvement of the degree of stenosis with improvement of the anterograde flow and no evidence of in stent thrombosis. Flat panel CT of the head was obtained and post processed in a separate workstation with concurrent attending physician supervision. Selected images were sent to PACS. No evidence of hemorrhagic complication. A left common femoral artery angiogram was obtained with left anterior oblique views. The puncture is at the common femoral artery which has adequate caliber for closure device. The femoral sheath was exchanged over the wire for a Perclose pro style which was utilized for access closure. Immediate hemostasis was achieved. 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 Rescue stenting performed with minimal residual stenosis. No hemorrhagic complication postprocedural flat panel head CT. PLAN: Continue on cangrelor drip until  head CT is obtained within 3-1/2 hours. At this point, patient may be loaded on ticagrelor and aspirin if CT shows no evidence of hemorrhagic complication. Electronically Signed   By: Erven Colla  de Sindy Messing M.D.   On: 11/20/2020 13:13   VAS [place] LOWER EXTREMITY VENOUS (DVT)  Result Date: 11/20/2020  Lower Venous DVT Study Patient Name:  CHERYLANNE ARDELEAN  Date of Exam:   11/20/2020 Medical Rec #: [id number]        Accession #:    [id number] Date of Birth: 09/21/1930        Patient Gender: F Patient Age:   27 years Exam Location:  [facility] Procedure:      VAS [place] LOWER EXTREMITY VENOUS (DVT) Referring Phys: Cornelius Moras XU --------------------------------------------------------------------------------  Indications: Embolic stroke.  Limitations: Patient guarding, intolerant to probe pressure, size and depth of vessels. Comparison Study: No prior studies. Performing Technologist: Darlin Coco RDMS, RVT  Examination Guidelines: A complete evaluation includes B-mode imaging, spectral Doppler, color Doppler, and power Doppler as needed of all accessible portions of each vessel. Bilateral testing is considered an integral part of a complete examination. Limited examinations for reoccurring indications may be performed as noted. The reflux portion of the exam is performed with the patient in reverse Trendelenburg.  +---------+---------------+---------+-----------+----------+---------------+ RIGHT    CompressibilityPhasicitySpontaneityPropertiesThrombus Aging  +---------+---------------+---------+-----------+----------+---------------+ CFV      Full           Yes      Yes                                  +---------+---------------+---------+-----------+----------+---------------+ SFJ      Full                                                         +---------+---------------+---------+-----------+----------+---------------+ FV Prox  Full                                                          +---------+---------------+---------+-----------+----------+---------------+ FV Mid   Full                                                         +---------+---------------+---------+-----------+----------+---------------+ FV DistalFull                                                         +---------+---------------+---------+-----------+----------+---------------+ PFV      Full                                                         +---------+---------------+---------+-----------+----------+---------------+ POP      Full           Yes      Yes                                  +---------+---------------+---------+-----------+----------+---------------+  PTV                                                   Patent by color +---------+---------------+---------+-----------+----------+---------------+ PERO                                                  Patent by color +---------+---------------+---------+-----------+----------+---------------+   +---------+---------------+---------+-----------+----------+-------------------+ LEFT     CompressibilityPhasicitySpontaneityPropertiesThrombus Aging      +---------+---------------+---------+-----------+----------+-------------------+ CFV                     Yes      Yes                                      +---------+---------------+---------+-----------+----------+-------------------+ SFJ      Full                                                             +---------+---------------+---------+-----------+----------+-------------------+ FV Prox  Full                                                             +---------+---------------+---------+-----------+----------+-------------------+ FV Mid   Full                                                             +---------+---------------+---------+-----------+----------+-------------------+ FV Distal                                              Unable to visualize                                                       due to patient                                                            guarding            +---------+---------------+---------+-----------+----------+-------------------+ PFV      Full                                                             +---------+---------------+---------+-----------+----------+-------------------+  POP      Full                                                             +---------+---------------+---------+-----------+----------+-------------------+ PTV      Full           Yes      Yes                                      +---------+---------------+---------+-----------+----------+-------------------+ PERO                    Yes      Yes                  Patent by color     +---------+---------------+---------+-----------+----------+-------------------+     Summary: RIGHT: - There is no evidence of deep vein thrombosis in the lower extremity. However, portions of this examination were limited- see technologist comments above.  - No cystic structure found in the popliteal fossa.  LEFT: - There is no evidence of deep vein thrombosis in the lower extremity. However, portions of this examination were limited- see technologist comments above.  - No cystic structure found in the popliteal fossa.  *See table(s) above for measurements and observations. Electronically signed by Harold Barban MD on 11/20/2020 at 8:08:30 PM.    Final    VAS [place] TRANSCRANIAL DOPPLER  Result Date: 11/22/2020  Transcranial Doppler Patient Name:  CHERONDA ERCK  Date of Exam:   11/22/2020 Medical Rec #: [id number]        Accession #:    [id number] Date of Birth: 01-14-1931        Patient Gender: F Patient Age:   55 years Exam Location:  [facility] Procedure:      VAS [place] TRANSCRANIAL DOPPLER Referring Phys: PRAMOD SETHI  --------------------------------------------------------------------------------  Indications: Stroke. Limitations for diagnostic windows: Unable to insonate right transtemporal window. Unable to insonate left transtemporal window. Comparison Study: No prior studies. Performing Technologist: Darlin Coco RDMS, RVT  Examination Guidelines: A complete evaluation includes B-mode imaging, spectral Doppler, color Doppler, and power Doppler as needed of all accessible portions of each vessel. Bilateral testing is considered an integral part of a complete examination. Limited examinations for reoccurring indications may be performed as noted.  +----------+-------------+----------+-----------+------------------+ RIGHT TCD Right VM (cm)Depth (cm)Pulsatility     Comment       +----------+-------------+----------+-----------+------------------+ MCA                                         Unable to insonate +----------+-------------+----------+-----------+------------------+ ACA                                         Unable to insonate +----------+-------------+----------+-----------+------------------+ Term ICA                                    Unable to insonate +----------+-------------+----------+-----------+------------------+ PCA  Unable to insonate +----------+-------------+----------+-----------+------------------+ Opthalmic     22.00                 1.61                       +----------+-------------+----------+-----------+------------------+ ICA siphon                                  Unable to insonate +----------+-------------+----------+-----------+------------------+ Vertebral    -11.00                 0.59                       +----------+-------------+----------+-----------+------------------+  +----------+------------+----------+-----------+-------------------------------+ LEFT TCD  Left VM (cm)Depth (cm)Pulsatility             Comment             +----------+------------+----------+-----------+-------------------------------+ MCA                                              Unable to insonate        +----------+------------+----------+-----------+-------------------------------+ ACA                                         Unable to insonate Unable to                                                         insonate             +----------+------------+----------+-----------+-------------------------------+ Term ICA                                         Unable to insonate        +----------+------------+----------+-----------+-------------------------------+ PCA                                              Unable to insonate        +----------+------------+----------+-----------+-------------------------------+ Opthalmic    22.00                 1.60                                    +----------+------------+----------+-----------+-------------------------------+ ICA siphon                                       Unable to insonate        +----------+------------+----------+-----------+-------------------------------+ Vertebral    -12.00                1.03                                    +----------+------------+----------+-----------+-------------------------------+  +------------+------+-------+  VM cm Comment +------------+------+-------+ Prox Basilar-15.00        +------------+------+-------+ Summary:  Highly limited study due to poor windows throughout. Antegrade flow noted in both opthalmics, vertebrals and basilar arteries. *See table(s) above for TCD measurements and observations.  Diagnosing physician: Antony Contras MD Electronically signed by Antony Contras MD on 11/22/2020 at 3:21:35 PM.    Final    CT ANGIO HEAD NECK W WO CM W PERF (CODE STROKE)  Result Date: 11/19/2020 CLINICAL DATA:  Neuro deficit, acute, stroke suspected EXAM: CT ANGIOGRAPHY  HEAD AND NECK CT PERFUSION BRAIN TECHNIQUE: Multidetector CT imaging of the head and neck was performed using the standard protocol during bolus administration of intravenous contrast. Multiplanar CT image reconstructions and MIPs were obtained to evaluate the vascular anatomy. Carotid stenosis measurements (when applicable) are obtained utilizing NASCET criteria, using the distal internal carotid diameter as the denominator. Multiphase CT imaging of the brain was performed following IV bolus contrast injection. Subsequent parametric perfusion maps were calculated using RAPID software. CONTRAST:  126m OMNIPAQUE IOHEXOL 350 MG/ML SOLN COMPARISON:  None. FINDINGS: CT HEAD Brain: There is no acute intracranial hemorrhage, mass effect, or edema. There is loss of gray-white differentiation along the inferior insula and likely adjacent temporal lobe. There is no extra-axial fluid collection. Prominence of the ventricles and sulci reflects generalized parenchymal volume loss. Patchy low-attenuation in the supratentorial white matter is nonspecific but may reflect chronic microvascular ischemic changes. There is a small chronic left cerebellar infarct. Vascular: Focal hyperdensity is present along the left M1 MCA. Skull: Calvarium is unremarkable. Sinuses/Orbits: No acute finding. Other: None. Review of the MIP images confirms the above findings CTA NECK Aortic arch: Mild mixed plaque is present along the aortic arch. There is irregular noncalcified plaque along the superior margin beyond the left subclavian origin. Right carotid system: Patent. Mild calcified plaque at the bifurcation. No stenosis. Left carotid system: Patent. Mild calcified plaque at the bifurcation and proximal internal carotid. No stenosis. Vertebral arteries: Patent. Left vertebral artery slightly dominant. No stenosis. Skeleton: Cervical spine degenerative changes. Vertebral body hemangioma at T1. Other neck: Unremarkable. Upper chest: No apical lung  mass. Review of the MIP images confirms the above findings CTA HEAD Anterior circulation: Intracranial internal carotid arteries are patent with mild calcified plaque but no significant stenosis. Proximal left M1 MCA is patent. There is early bifurcation of the MCA with distal M1 proximal M2 occlusion. Partial reconstitution. There is high-grade stenosis or nonocclusive thrombus within anterior temporal branch approximately 5 mm beyond its origin arising just proximal to the occlusion. Right middle and both anterior cerebral arteries are patent. Posterior circulation: Intracranial vertebral arteries are patent with mixed plaque causing mild stenosis. Basilar artery is patent. Major cerebellar artery origins are patent. Bilateral posterior communicating arteries are present with fetal origin of both patent posterior cerebral arteries. Venous sinuses: Patent as allowed by contrast bolus timing. Review of the MIP images confirms the above findings CT Brain Perfusion Findings: CBF (<30%) Volume: 569mPerfusion (Tmax>6.0s) volume: 4442mismatch Volume: 26m84mfarction Location: Left MCA territory IMPRESSION: No acute intracranial hemorrhage. Small acute left MCA territory infarction involving inferior insula and adjacent temporal lobe (ASPECT score is 8). Early bifurcation of the left MCA with distal M1 and proximal M2 occlusion and subsequent partial reconstitution. High-grade stenosis or nonocclusive thrombus within anterior temporal branch approximately 5 mm beyond its origin just proximal to the occlusion. Perfusion imaging demonstrates 5 mL of core infarction partially corresponding to noncontrast CT abnormality. There is  a calculated penumbra of 39 mL in the left MCA territory. No occlusion or hemodynamically significant stenosis in the neck. Initial results were communicated to Dr. Curly Shores at 3:11 pm on 11/19/2020 by text page via the [facility] messaging system. Electronically Signed   By: Macy Mis M.D.   On:  11/19/2020 15:24    Labs:  Basic Metabolic Panel: Recent Labs  Lab 12/09/20 0528  NA 137  K 3.5  CL 106  CO2 22  GLUCOSE 75  BUN 17  CREATININE 0.97  CALCIUM 8.5*    CBC: Recent Labs  Lab 12/09/20 0528  WBC 6.1  HGB 9.5*  HCT 28.4*  MCV 95.9  PLT 274    CBG: No results for input(s): GLUCAP in the last 168 hours.   Brief HPI:   Maria Dyer is a 87 y.o. right-handed female with history of hypertension severely hearing impaired breast cancer status postmastectomy 25 years ago hypothyroidism hypertension.  Per chart review lives alone household ambulator.  Presented 10/30/2020 with aphasia.  Admission chemistries unremarkable except creatinine 1.13.  CT of the head CT angiogram head and neck, CT cerebral perfusion scan showed no acute intracranial hemorrhage.  Small acute left MCA territory infarct involving inferior insula and adjacent temporal lobe.  Early bifurcation of the left MCA and distal M1 and proximal M2 occlusion and subsequent partial reconstitution.  High-grade stenosis or nonocclusive thrombus within anterior temporal branch approximate 5 mm beyond its origin just proximal to the occlusion.  Perfusion imaging demonstrated 5 mL of core infarct partially corresponding to noncontrast CT abnormality.  No occlusion or hemodynamically significant stenosis in the neck.  Patient did not receive tPA.  Underwent mechanical thrombectomy per interventional radiology with stenting.  MRI of the head post stenting showed multifocal acute ischemic left MCA distribution infarct involving left parietal temporal lobes.  Minimal associated petechial hemorrhage without frank intraparenchymal hematoma or mass-effect.  Echocardiogram with ejection fraction of 60 to 65% no wall motion abnormalities.  Neurology follow-up maintained on aspirin 81 mg daily as well as Brilinta 90 mg twice daily.  Lower extremity Dopplers negative.  Wound care nurse follow-up for MAS D buttocks and groin with skin  care as directed.  Therapy evaluations completed and patient was admitted for a comprehensive rehab program.   Hospital Course: KARYME MCCONATHY was admitted to rehab 11/25/2020 for inpatient therapies to consist of PT, ST and OT at least three hours five days a week. Past admission physiatrist, therapy team and rehab RN have worked together to provide customized collaborative inpatient rehab.  Pertaining to patient's left MCA patchy infarct/left M2 near occlusion status post revascularization stenting maintained on aspirin as well as Brilinta with follow-up neurology services.  Synthroid ongoing for hypothyroidism.  Lipitor for hyperlipidemia.  Blood pressure controlled on Norvasc/Toprol as well as Cozaar.  Mood stabilization with the use of Provigil to help patient with fatigue factors that had also been decreased due to some jerking movements was later discontinued at time of discharge due to need for prior authorization insurance would not cover.  Bouts of constipation with laxative assistance.  Hospital course greater than 100,000 Aerococcus Urinae UTI completing course of antibiotic therapy.  Initial AKI creatinine 1.21 improved 0.97.  MAS D buttock and groin zinc oxide as directed completing course of Diflucan.   Blood pressures were monitored on TID basis and soft and monitored     Rehab course: During patient's stay in rehab weekly team conferences were held to monitor patient's progress, set  goals and discuss barriers to discharge. At admission, patient required moderate assist 5 feet 2 person hand-held assist moderate assist supine to sit  Physical exam.  Blood pressure 148/55 pulse 76 temperature 98.8 respiration 19 oxygen saturations 99% room air Constitutional.  No acute distress HEENT Head.  Normocephalic and atraumatic Eyes.  Pupils round and reactive to light no discharge.nystagmus Neck.  Supple nontender no JVD without thyromegaly Cardiac regular rate rhythm any extra sounds or  murmur heard Abdomen.  Soft nontender positive bowel sounds without rebound Respiratory effort normal no respiratory distress without wheeze Neurologic.  Alert severely hard of hearing globally aphasic.  She did follow some simple commands.  He/She  has had improvement in activity tolerance, balance, postural control as well as ability to compensate for deficits. He/She has had improvement in functional use RUE/LUE  and RLE/LLE as well as improvement in awareness.  Sit to stand x3 reps with contact-guard assist.  Patient perseverates on returning to her room at times.  Stand pivot transfers wheelchair to bed with max assist.  She did have some difficulty following commands needing cues.  Dons her shorts at bed level with max assist supine to sitting edge of bed max assist.  Sit to stand and SPT to and from wheelchair to bed with max assist.  Speech therapy follow-up for expressive receptive aphasia she required mod max cues to respond to basic level open ended questions.  Due to limited advances plan was for discharge to assisted living facility       Disposition: Discharged to assisted living facility    Diet: Regular  Special Instructions: No smoking or alcohol  Medications at discharge 1.  Tylenol as needed 2.  Norvasc 5 mg daily 3.  Aspirin 81 mg daily 4.  Lipitor 40 mg p.o. daily 5.  Synthroid 50 mcg p.o. daily 6.  Cozaar 100 mg p.o. daily 7  Toprol-XL 50 mg p.o. daily 8.  Brilinta 90 mg p.o. twice daily 9.  Diflucan 150 mg on 820 2022x1 dose and stop  30-35 minutes were spent completing discharge summary and discharge planning Discharge Instructions     Ambulatory referral to Neurology   Complete by: As directed    An appointment is requested in approximately: 4 weeks left MCA infarction   Ambulatory referral to Physical Medicine Rehab   Complete by: As directed    Moderate complexity follow-up 1 to 2 weeks left MCA infarction        Follow-up Information      Raulkar, Clide Deutscher, MD Follow up.   Specialty: Physical Medicine and Rehabilitation Why: 01/03/21 please arrive at 12:40pm for 1:00pm appointment, thank you! Contact information: [address] [place] [address] [phone]         Luanne Bras, MD Follow up.   Specialties: Interventional Radiology, Radiology Why: Call for appointment Contact information: [place] [address] [phone]                 Signed: Cathlyn Parsons 12/12/2020, 5:13 AM

## 2020-12-10 MED ORDER — TUBERCULIN PPD 5 UNIT/0.1ML ID SOLN
5.0000 [IU] | Freq: Once | INTRADERMAL | Status: AC
  Administered 2020-12-10: 5 [IU] via INTRADERMAL
  Filled 2020-12-10: qty 0.1

## 2020-12-10 MED ORDER — MEGESTROL ACETATE 400 MG/10ML PO SUSP
400.0000 mg | Freq: Every day | ORAL | Status: DC
  Administered 2020-12-11 – 2020-12-12 (×2): 400 mg via ORAL
  Filled 2020-12-10 (×2): qty 10

## 2020-12-10 NOTE — Plan of Care (Signed)
  Problem: Consults Goal: RH STROKE PATIENT EDUCATION Description: See Patient Education module for education specifics  Outcome: Progressing   Problem: RH BOWEL ELIMINATION Goal: RH STG MANAGE BOWEL WITH ASSISTANCE Description: STG Manage Bowel with mod I Assistance. Outcome: Progressing   Problem: RH BLADDER ELIMINATION Goal: RH STG MANAGE BLADDER WITH ASSISTANCE Description: STG Manage Bladder With mod I Assistance Outcome: Progressing   Problem: RH SKIN INTEGRITY Goal: RH STG MAINTAIN SKIN INTEGRITY WITH ASSISTANCE Description: STG Maintain Skin Integrity With min Assistance. Outcome: Progressing   Problem: RH SAFETY Goal: RH STG ADHERE TO SAFETY PRECAUTIONS W/ASSISTANCE/DEVICE Description: STG Adhere to Safety Precautions With cues/supervision Assistance/Device. Outcome: Progressing   Problem: RH PAIN MANAGEMENT Goal: RH STG PAIN MANAGED AT OR BELOW PT'S PAIN GOAL Description: At or below level 4 Outcome: Progressing   Problem: RH KNOWLEDGE DEFICIT Goal: RH STG INCREASE KNOWLEDGE OF HYPERTENSION Description: Patient will be able to manage HTN using medications and dietary modifications using handouts and educational tools w cues Outcome: Progressing Goal: RH STG INCREASE KNOWLEGDE OF HYPERLIPIDEMIA Description: Patient will be able to manage HLD using medications and dietary modifications using handouts and educational tools w cues Outcome: Progressing Goal: RH STG INCREASE KNOWLEDGE OF STROKE PROPHYLAXIS Description: Patient will be able to manage secondary stroke risks using medications and dietary modifications using handouts and educational tools w cues Outcome: Progressing   Problem: RH KNOWLEDGE DEFICIT Goal: RH STG INCREASE KNOWLEDGE OF DIABETES Description: Patient will be able to manage prrediabetes  using medications and dietary modifications using handouts and educational tools w cues Outcome: Progressing   

## 2020-12-10 NOTE — Progress Notes (Signed)
Patient ID: Maria Dyer, female   DOB: 1930-10-16, 87 y.o.   MRN: [id number]  Met with pt and spoke with Danielle-niece to update team conference progress toward her goals and plan for Thursday of transferring to [facility]. Family plans on transporting her there. The hospital bed and wheelchair have been ordered and delivery is being set up with niece. Have faxed completed information to facility awaiting TB results and will have COVID test tomorrow for results for Thursday am. All pleased with this plan and feel pt will be happier there and hopefully will engage more with other residents. Work toward discharge Thursday

## 2020-12-10 NOTE — Progress Notes (Signed)
Physical Therapy Session Note  Patient Details  Name: Maria Dyer MRN: [id number] Date of Birth: [birth year]  Today's Date: 12/10/2020 PT Individual Time: 0800-0826 PT Individual Time Calculation (min): 26 min  and Today's Date: 12/10/2020 PT Missed Time: 19 Minutes Missed Time Reason: Patient unwilling to participate  Short Term Goals: Week 2:  PT Short Term Goal 1 (Week 2): pt will complete bed mobility with CGA PT Short Term Goal 2 (Week 2): Pt will complete bed<>chair transfers with minA and LRAD PT Short Term Goal 3 (Week 2): Pt will ambulate 45f with minA and LRAD  Skilled Therapeutic Interventions/Progress Updates:     Pt met in bed with RN at bedside. Pt noted to be refusing morning medications from nursing - per nursing, will reattempt after therapy session. Pt does not appear to be in pain. Donned shorts at bed level with maxA due to lack of effort - repeating "I can't, I can't" throughout the session. Rolls in bed with supervision with use of bed rails. She refused any effort to get to EOB and would begin to become agitated with increased encouragement and effort, grabbing the bed rail. Resorted to bed level there-ex as listed below. Focused on hip, hamstring, and quad activation. Verbal and tactile cues throughout for sequencing and technique. Required AAROM at times due to lack of effort. Required max cues for scooting up in the bed for better positioning. Bed placed in chair position. Bed alarm set. All needs in reach. She missed 19 minutes due to refusal to participate.  Therapy Documentation Precautions:  Precautions Precautions: Fall Precaution Comments: receptive > expressive aphasia, HoH Restrictions Weight Bearing Restrictions: No General: PT Amount of Missed Time (min): 19 Minutes PT Missed Treatment Reason: Patient unwilling to participate  Exercises Gluteal Sets: 15 reps Heel Slides: 15 reps Hip ABduction/ADduction: 15 reps Straight Leg Raises: 15  reps  Therapy/Group: Individual Therapy  Bonna Steury P Krzysztof Reichelt PT 12/10/2020, 7:33 AM

## 2020-12-10 NOTE — Progress Notes (Signed)
Speech Language Pathology Daily Session Note  Patient Details  Name: Maria Dyer MRN: [id number] Date of Birth: [birth year]  Today's Date: 12/10/2020 SLP Individual Time: 1030-1100 SLP Individual Time Calculation (min): 30 min  Short Term Goals: Week 3: SLP Short Term Goal 1 (Week 3): STG=LTG due to awaiting SNF placement  Skilled Therapeutic Interventions:Skilled ST services focused on cognitive skills. Pt was watching tv in Spanish upon SLP entering, appearing content and when questioned if she spoke spanish pt stated " I don't know." Pt requested " I want cold water" and picked up the ensure making a displeased face. Pt was able to confirm via yes/no response supported by written cues on white board of desired drink.SLP provided water. Pt stated " I don't know" to orientation questions pertaining to place and situation even with max A visual and written cues as well as an attempt of returned demonstration of information. SLP facilitated sustained attention, error awareness and basic problem solving skills in card task WAR (naming highest card) and sorting cards by numbers (2-10.) Pt was able to name highest card among 2  in 19 out 23 opportunities (82% accuracy) with mod A verbal cues to clarify response using visual gestures (bigger number) and verbal cues. Pt was able to sort cards 2-10 with initial demonstration fading to min A verbal cues for error awareness. Pt was able to name card number/face with 80% accuracy and no ability to correct verbal errors. Pt was able to name 1 out 4 items in the room, then pt politely dismissed SLP, stating " im fine. No that's ok." When SLP attempted to engage pt in further tasks. Pt located call bell button when presented with call bell, however looked at the TV after pressing the button and attempted to press it again (appearing intent was to turn tv on.) SLP provided education and demonstration of call bell and tv button on remote. SLP turned tv back on, to  the spanish channel, pt stated "that's ok" when asked if she would like to watch it, however when SLP switched channels pt stated "ok that's fine." Pt continues to demonstrate little awareness of acute cognitive and linguistic deficits. Pt missed 30 minutes of planned treatment time due to refusal to participate. Pt was left in room with call bell within reach and bed alarm set. SLP recommends to continue skilled services.      Pain Pain Assessment Pain Scale: 0-10 Pain Score: 0-No pain  Therapy/Group: Individual Therapy  Emaya Preston  [facility] 12/10/2020, 11:19 AM

## 2020-12-10 NOTE — Progress Notes (Addendum)
PROGRESS NOTE   Subjective/Complaints: Mrs. Kook has no new complaints this morning Sitting upright in bed, watching TV, pleasant but with receptive aphasia, stable. Plan for d/c to SNF on Thursday  ROS: Limited due to language/communication   Objective:   No results found. Recent Labs    12/09/20 0528  WBC 6.1  HGB 9.5*  HCT 28.4*  PLT 274     Recent Labs    12/09/20 0528  NA 137  K 3.5  CL 106  CO2 22  GLUCOSE 75  BUN 17  CREATININE 0.97  CALCIUM 8.5*      Intake/Output Summary (Last 24 hours) at 12/10/2020 1007 Last data filed at 12/09/2020 1825 Gross per 24 hour  Intake 120 ml  Output --  Net 120 ml         Physical Exam: Vital Signs Blood pressure (!) 155/62, pulse 81, temperature 98.6 F (37 C), resp. rate 16, height 5\' 9"  (1.753 m), weight 60.6 kg, SpO2 100 %. Gen: no distress, normal appearing HEENT: oral mucosa pink and moist, NCAT Cardio: Reg rate Chest: normal effort, normal rate of breathing Abd: soft, non-distended Ext: no edema Psych: pleasant, normal affect Skin: intact Neurological: Ox0-1- wasn't able to give me her name without cues  Ext: no edema Psych: pleasant, normal affect, smiling Skin: bruising on both arms Neurological/MSK    Comments: Patient is severely hard of hearing.  Patient is awake alert, eyes are open.  Receptive> expressive aphasia unchanged. She did not follow commands.  Can express herself at phrase level but not answer specific questions   Assessment/Plan: 1. Functional deficits which require 3+ hours per day of interdisciplinary therapy in a comprehensive inpatient rehab setting. Physiatrist is providing close team supervision and 24 hour management of active medical problems listed below. Physiatrist and rehab team continue to assess barriers to discharge/monitor patient progress toward functional and medical goals  Care Tool:  Bathing     Body parts bathed by patient: Right arm, Left arm, Chest, Abdomen, Face, Right upper leg, Left upper leg   Body parts bathed by helper: Front perineal area, Buttocks, Right lower leg, Left lower leg     Bathing assist Assist Level: 2 Helpers     Upper Body Dressing/Undressing Upper body dressing   What is the patient wearing?: Pull over shirt    Upper body assist Assist Level: Minimal Assistance - Patient > 75%    Lower Body Dressing/Undressing Lower body dressing      What is the patient wearing?: Pants     Lower body assist Assist for lower body dressing: Maximal Assistance - Patient 25 - 49%     Toileting Toileting    Toileting assist Assist for toileting: Maximal Assistance - Patient 25 - 49%     Transfers Chair/bed transfer  Transfers assist     Chair/bed transfer assist level: Moderate Assistance - Patient 50 - 74%     Locomotion Ambulation   Ambulation assist   Ambulation activity did not occur: Refused  Assist level: 2 helpers (mod A & +2 close w/c follow) Assistive device: Walker-Eva Max distance: 36ft   Walk 10 feet activity   Assist  Walk  10 feet activity did not occur: Refused        Walk 50 feet activity   Assist Walk 50 feet with 2 turns activity did not occur: Refused         Walk 150 feet activity   Assist Walk 150 feet activity did not occur: Refused         Walk 10 feet on uneven surface  activity   Assist Walk 10 feet on uneven surfaces activity did not occur: Refused         Wheelchair     Assist Is the patient using a wheelchair?: Yes Type of Wheelchair: Manual    Wheelchair assist level: Dependent - Patient 0%      Wheelchair 50 feet with 2 turns activity    Assist        Assist Level: Dependent - Patient 0%   Wheelchair 150 feet activity     Assist      Assist Level: Dependent - Patient 0%   Blood pressure (!) 155/62, pulse 81, temperature 98.6 F (37 C), resp. rate 16,  height 5\' 9"  (1.753 m), weight 60.6 kg, SpO2 100 %.  Medical Problem List and Plan: 1.  Aphasia secondary to left MCA patchy infarct/left M2 near occlusion status post revascularization with stenting             -patient may shower             -ELOS/Goals: 2-3 weeks S  -Continue CIR therapies including PT, OT, and SLP -communication goals downgraded to Min/Mod  -Interdisciplinary Team Conference today   2.  Impaired mobility: -DVT/anticoagulation:  Mechanical: Sequential compression devices, below knee Bilateral lower extremities             -antiplatelet therapy: Continue Aspirin 81 mg daily and Brilinta 90 mg twice daily 3. Pain Management: N/A 4. Mood: Provide emotional support             -antipsychotic agents: N/A 5. Neuropsych: This patient is not capable of making decisions on her own behalf.  ?increased confusion, incontinence   - UA+, UCx pending   -empiric keflex started 9/10   -seems a little brighter today 9/11- UC with >100,000 aerococcus, switch to amoxicillin- 5 day stop date placed 6. Skin/Wound Care: Routine skin checks 7. Fluids/Electrolytes/Nutrition: Routine in and outs with follow-up chemistries 8.  Hyperlipidemia.  Continue Lipitor 9.  Hypothyroidism.  Continue Synthroid 10.  Hypertension.  Labile, continue Cozaar 100 mg daily, Toprol-XL 50 mg daily.  Increase amlodipine to 5mg . Monitor with increased mobility. Magnesium level reviewed and normal.  11.  MASD buttocks and groin.  Zinc oxide as directed with routine skin checks. continue diflucan per pharmacy consult.  12. AKI: Normalized. Placed nursing order to encourage 6-8 glasses of water per day. 13. Hard of hearing: Try to wear see through masks when communicating with her 14. Low protein: continue Juven supplementation.  15. Fatigue: improved, continue Modafinil 100mg .  16. Decreased appetite:    -ask RD for input  -BMET stable, prealbumin 21, continue megace- decrease to 400mg  daily given increased clot  risk with BID dosing as per pharmacy 17. Sudden jerking movements noted by family: decreased Modafinil to 100mg .  18. Constipation: milk of magnesia on 9/9   LOS: 15 days A FACE TO FACE EVALUATION WAS PERFORMED  Biddie Sebek P Shareena Nusz 12/10/2020, 10:07 AM

## 2020-12-10 NOTE — Progress Notes (Signed)
Occupational Therapy Session Note  Patient Details  Name: Maria Dyer MRN: [id number] Date of Birth: 1931/03/06  Today's Date: 12/10/2020 OT Individual Time: 1007-1025 OT Individual Time Calculation (min): 18 min    Short Term Goals: Week 1:  OT Short Term Goal 1 (Week 1): Pt will don LB clothing with max A OT Short Term Goal 1 - Progress (Week 1): Met OT Short Term Goal 2 (Week 1): Pt will complete sit > stand with mod A OT Short Term Goal 2 - Progress (Week 1): Met OT Short Term Goal 3 (Week 1): Pt will complete stand pivot with mod A OT Short Term Goal 3 - Progress (Week 1): Met Week 2:  OT Short Term Goal 1 (Week 2): pt will stand with unilateral support for ~ 30 seconds as precursor to LB ADLS OT Short Term Goal 2 (Week 2): pt will complete 2/3 toileting tasks with MOD A OT Short Term Goal 3 (Week 2): pt will don LB clothing with MOD A  Skilled Therapeutic Interventions/Progress Updates:  Patient met lying supine in bed. 0/10 on [employer]. Patient gesturing toward window shade with seemingly requesting that this writer pull shade down. Patient also reports being hot. Patient came to EOB with supervision A and cues for sequencing. Min A to doff button up long-sleeve shirt. Patient then declined further participation with OT session returning to supine spontaneously. Patient encouraged to participate with toileting or grooming tasks but patient continued to decline stating "I can't do it" several times. Session concluded with patient lying supine in bed with call bell within reach, bed alarm activated and all needs met. Patient missed 12 min of OT treatment session.   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

## 2020-12-10 NOTE — Patient Care Conference (Signed)
Inpatient RehabilitationTeam Conference and Plan of Care Update Date: 12/10/2020   Time: 13:23 PM    Patient Name: Maria Dyer      Medical Record Number: [id number]  Date of Birth: [birth year] Sex: Female         Room/Bed: 4M09C/4M09C-01 Payor Info: Payor: [employer] MEDICARE / Plan: Monia Pouch MEDICARE HMO/PPO / Product Type: *No Product type* /    Admit Date/Time:  11/25/2020  6:19 PM  Primary Diagnosis:  Left middle cerebral artery stroke [facility])  Hospital Problems: Principal Problem:   Left middle cerebral artery stroke [facility])    Expected Discharge Date: Expected Discharge Date: 12/12/20  Team Members Present: Physician leading conference: Dr. Sula Soda Social Worker Present: Dossie Der, LCSW Nurse Present: Chana Bode, RN PT Present: Wynelle Link, PT OT Present: Lina Sayre, OT SLP Present: Colin Benton, SLP     Current Status/Progress Goal Weekly Team Focus  Bowel/Bladder             Swallow/Nutrition/ Hydration             ADL's   Limited by patient refusal. Last known status patient was Min A UB dress, Max A LB dress at bed level, supervision A bed mobility.  min assist overall  Self-care retraining, transfer training, NMR, balance training and cognitive retraining.   Mobility   min/modA bed mobility, minA sit<>stand transfers with BUE support to grab bars or hand rails. ModA squat<>pivot transfers. Has not been functionally ambulating due to fear of falling, anxiety, and refusal for attempts  minA. Ambulation goal has been discharged  Bed mobility, functional transfers, standing tolerance, pre-gait training, improving activity tolerance and participation, DC planning   Communication   max-mod A  Min-Mod A downgarded 9/7  error awareness, naming, yes/no response, reading and writting   Safety/Cognition/ Behavioral Observations  Max-total A error awareness  max A error awareness - downgraded 9/12  error awareness   Pain             Skin                Discharge Planning:  Going to [facility] on Thursday getting paperwork completed and working on transport to facility and equipment   Team Discussion: Patients progress limited by participation and refusal of therapy sessions. Fear and anxiety also limit functional status and patient is easily agitated.   Patient on target to meet rehab goals: no, currently min assist for upper body care and max assist for lower body care. Min guard for status sitting balance and supervision for bed mobility. Needs min assist to go supine to sit and CGA for sit to stand. Min assist needed for upper body ADLs. Needs mod - max assist for expressive language; able to express at phrase level. Cannot answer questions and needs visual cues.   *See Care Plan and progress notes for long and short-term goals.   Revisions to Treatment Plan:  Downgraded goals   Teaching Needs: Safety, medications, secondary risk management, transfers, etc.  Current Barriers to Discharge: Decreased caregiver support, Home enviroment access/layout, and Behavior  Possible Resolutions to Barriers: Family education ALF recommended for discharge     Medical Summary Current Status: increaed twitching, decreased appetite, impaired initation, UTI  Barriers to Discharge: Behavior;Medical stability  Barriers to Discharge Comments: increaed twitching, decreased appetite, impaired initation, UTI Possible Resolutions to [employer] Focus: decrease modafinil to 100mg , continue megace, continue amoxicillin   Continued Need for Acute Rehabilitation Level of Care: The patient requires daily  medical management by a physician with specialized training in physical medicine and rehabilitation for the following reasons: Direction of a multidisciplinary physical rehabilitation program to maximize functional independence : Yes Medical management of patient stability for increased activity during participation in an intensive  rehabilitation regime.: Yes Analysis of laboratory values and/or radiology reports with any subsequent need for medication adjustment and/or medical intervention. : Yes   I attest that I was present, lead the team conference, and concur with the assessment and plan of the team.   Chana Bode B 12/10/2020, 2:00 PM

## 2020-12-11 ENCOUNTER — Other Ambulatory Visit (HOSPITAL_COMMUNITY): Payer: Self-pay

## 2020-12-11 LAB — SARS CORONAVIRUS 2 (TAT 6-24 HRS): SARS Coronavirus 2: NEGATIVE

## 2020-12-11 MED ORDER — METOPROLOL SUCCINATE ER 50 MG PO TB24
50.0000 mg | ORAL_TABLET | Freq: Every day | ORAL | 0 refills | Status: AC
  Filled 2020-12-11: qty 30, 30d supply, fill #0

## 2020-12-11 MED ORDER — LEVOTHYROXINE SODIUM 50 MCG PO TABS
50.0000 ug | ORAL_TABLET | Freq: Every day | ORAL | 0 refills | Status: AC
  Filled 2020-12-11: qty 30, 30d supply, fill #0

## 2020-12-11 MED ORDER — TICAGRELOR 90 MG PO TABS
90.0000 mg | ORAL_TABLET | Freq: Two times a day (BID) | ORAL | 0 refills | Status: AC
  Filled 2020-12-11: qty 60, 30d supply, fill #0

## 2020-12-11 MED ORDER — ATORVASTATIN CALCIUM 40 MG PO TABS
40.0000 mg | ORAL_TABLET | Freq: Every day | ORAL | 0 refills | Status: AC
  Filled 2020-12-11: qty 30, 30d supply, fill #0

## 2020-12-11 MED ORDER — INFLUENZA VAC A&B SA ADJ QUAD 0.5 ML IM PRSY
0.5000 mL | PREFILLED_SYRINGE | INTRAMUSCULAR | Status: AC
  Administered 2020-12-12: 0.5 mL via INTRAMUSCULAR
  Filled 2020-12-11: qty 0.5

## 2020-12-11 MED ORDER — FLUCONAZOLE 150 MG PO TABS
150.0000 mg | ORAL_TABLET | ORAL | 0 refills | Status: DC
  Filled 2020-12-11: qty 1, 7d supply, fill #0

## 2020-12-11 MED ORDER — ACETAMINOPHEN 325 MG PO TABS: 650.0000 mg | ORAL_TABLET | ORAL | Status: DC | PRN

## 2020-12-11 MED ORDER — MODAFINIL 100 MG PO TABS
100.0000 mg | ORAL_TABLET | Freq: Every day | ORAL | 0 refills | Status: DC
  Filled 2020-12-11: qty 30, 30d supply, fill #0

## 2020-12-11 MED ORDER — AMLODIPINE BESYLATE 5 MG PO TABS
5.0000 mg | ORAL_TABLET | Freq: Every day | ORAL | 0 refills | Status: AC
  Filled 2020-12-11: qty 30, 30d supply, fill #0

## 2020-12-11 MED ORDER — LOSARTAN POTASSIUM 100 MG PO TABS
100.0000 mg | ORAL_TABLET | Freq: Every day | ORAL | 0 refills | Status: DC
  Filled 2020-12-11: qty 30, 30d supply, fill #0

## 2020-12-11 NOTE — Discharge Summary (Signed)
Physical Therapy Discharge Summary  Patient Details  Name: Maria Dyer MRN: [id number] Date of Birth: 1930/09/29  Today's Date: 12/11/2020 PT Individual Time: 0800-0856 PT Individual Time Calculation (min): 56 min    Patient has met 3 of 7 long term goals due to improved balance and increased strength.  Patient to discharge at a wheelchair level [place] for bed mobility and modA for squat<>pivot transfers. Patient's care partner unavailable to provide the necessary physical and cognitive assistance at discharge. Family education has been ongoing during her stay - due to lack of available assist needed, pt will be discharging to [facility] for further care.  Reasons goals not met: Pt demonstrated very high levels of anxiety and fearfulness of falling during functional mobility tasks. She was self limiting and needed MAX cues for engagement, participation, and effort during therapy sessions. Her cognition and aphasia also impacted her ability to functionally participate or recall safety strategies during mobility.   Recommendation:  Patient will benefit from ongoing skilled PT services in home health setting to continue to advance safe functional mobility, address ongoing impairments in global deconditioning and weakness, standing balance, activity tolerance, and functional mobility in order to minimize fall risk.  Equipment: Hospital bed and wheelchair  Reasons for discharge: treatment goals met and discharge from hospital  Patient/family agrees with progress made and goals achieved: Yes  PT Discharge Precautions/Restrictions Precautions Precautions: Fall Precaution Comments: expressive > receptive aphasia, fear of falling, anxiety Restrictions Weight Bearing Restrictions: No Pain Pain Assessment Pain Scale: 0-10 Pain Score: 0-No pain Faces Pain Scale: No hurt Pain Interference Pain Interference Pain Effect on Sleep: 8. Unable to answer  (aphasia) Pain Interference with Therapy Activities: 8. Unable to answer (aphasia) Pain Interference with Day-to-Day Activities: 8. Unable to answer (aphasia) Vision/Perception  Vision - History Ability to See in Adequate Light: 1 Impaired Perception Perception: Within Functional Limits Praxis Praxis: Impaired Praxis Impairment Details: Motor planning;Initiation  Cognition Overall Cognitive Status: Impaired/Different from baseline Arousal/Alertness: Awake/alert Orientation Level: Oriented to person;Disoriented to time;Disoriented to place;Disoriented to situation Attention: Focused;Sustained Focused Attention: Impaired Focused Attention Impairment: Verbal basic;Functional basic Sustained Attention: Impaired Sustained Attention Impairment: Functional basic;Verbal basic Memory: Impaired Memory Impairment: Decreased recall of new information;Decreased short term memory;Decreased long term memory;Storage deficit Decreased Long Term Memory: Verbal basic;Functional basic Decreased Short Term Memory: Verbal basic;Functional basic Awareness: Impaired Awareness Impairment: Intellectual impairment Problem Solving: Impaired Problem Solving Impairment: Verbal basic;Functional basic Safety/Judgment: Impaired Sensation Sensation Light Touch: Appears Intact Hot/Cold: Appears Intact Proprioception: Appears Intact Stereognosis: Appears Intact Additional Comments: Functionally, appears intact - UTA due to cognition and aphasia. Coordination Gross Motor Movements are Fluid and Coordinated: No Fine Motor Movements are Fluid and Coordinated: No Coordination and Movement Description: Poor motor planning, significant fear of falling and anxiety that impacts movement Motor  Motor Motor: Motor apraxia;Hemiplegia;Abnormal postural alignment and control Motor - Discharge Observations: Global deconditioning and weakness  Mobility Bed Mobility Bed Mobility: Rolling Right;Rolling Left;Supine to  Sit;Sit to Supine Rolling Right: Supervision/verbal cueing Rolling Left: Supervision/Verbal cueing Supine to Sit: Minimal Assistance - Patient > 75% Sit to Supine: Moderate Assistance - Patient 50-74% Transfers Transfers: Sit to Stand;Stand to Sit;Stand Pivot Transfers Sit to Stand: Minimal Assistance - Patient > 75% (with BUE support to hand rail, bed rail, or grab bars) Stand to Sit: Minimal Assistance - Patient > 75% Stand Pivot Transfers: Moderate Assistance - Patient 50 - 74% Stand Pivot Transfer Details: Tactile cues for initiation;Tactile cues for sequencing;Tactile cues for  posture;Tactile cues for weight shifting;Verbal cues for sequencing;Verbal cues for technique;Visual cues/gestures for sequencing;Verbal cues for precautions/safety;Verbal cues for gait pattern;Verbal cues for safe use of DME/AE;Manual facilitation for placement;Manual facilitation for weight shifting Transfer (Assistive device): 1 person hand held assist (with gait belt) Locomotion  Gait Ambulation: Yes Gait Assistance: Maximal Assistance - Patient 25-49%;2 Helpers Gait Distance (Feet): 10 Feet Assistive device: Rolling walker;[place] walker;Other (Comment) (efforts made with multiple AD's - her 3 wheeled rollator, RW, and Ethelene Hal) Gait Assistance Details: Tactile cues for initiation;Tactile cues for sequencing;Tactile cues for posture;Tactile cues for weight shifting;Tactile cues for placement;Verbal cues for precautions/safety;Verbal cues for safe use of DME/AE;Verbal cues for gait pattern;Verbal cues for technique;Verbal cues for sequencing;Visual cues/gestures for sequencing;Manual facilitation for weight shifting;Manual facilitation for placement Gait Gait: Yes Gait Pattern: Impaired Gait Pattern: Step-to pattern;Decreased step length - right;Decreased step length - left;Decreased stance time - right;Decreased stance time - left;Decreased hip/knee flexion - right;Decreased hip/knee flexion - left;Shuffle;Right  flexed knee in stance;Left flexed knee in stance;Poor foot clearance - left;Poor foot clearance - right;Wide base of support Gait velocity: decreased Stairs / Additional Locomotion Stairs: No Pick up small object from the floor (from standing position) activity did not occur: Human resources officer Mobility: Yes  Trunk/Postural Assessment  Cervical Assessment Cervical Assessment: Exceptions to [facility] (forward head) Thoracic Assessment Thoracic Assessment: Exceptions to [facility] (rounded shoulders) Lumbar Assessment Lumbar Assessment: Exceptions to [facility] (posterior pelvic tilt) Postural Control Postural Control: Deficits on evaluation Righting Reactions: delayed Protective Responses: delayed  Balance Balance Balance Assessed: Yes Static Sitting Balance Static Sitting - Balance Support: Feet supported;No upper extremity supported Static Sitting - Level of Assistance: 5: Stand by assistance Dynamic Sitting Balance Dynamic Sitting - Balance Support: Feet supported;During functional activity Dynamic Sitting - Level of Assistance: 4: Min assist Static Standing Balance Static Standing - Balance Support: Bilateral upper extremity supported Static Standing - Level of Assistance: 4: Min assist Dynamic Standing Balance Dynamic Standing - Balance Support: During functional activity Dynamic Standing - Level of Assistance: 2: Max assist;3: Mod assist Extremity Assessment      RLE Assessment RLE Assessment: Exceptions to [facility] General Strength Comments: Ankle DF 4-/5, knee ext 4-/5, hip flex 2+/5 LLE Assessment LLE Assessment: Exceptions to [facility] General Strength Comments: Grossly 4-/5  Skilled Intervention: Pt seen supine in bed at start of session - awake and shakes head 'yes' to therapy session. She reports no pain during treatment. Noted to be incontinent of bowel with small smear. TotalA needed for brief change - able to roll in bed with supervision and use of bed rails. Donned  disposable pants at bed level with modA for threading LE's and instruction to pull over hips which she was able to achieve via bridging technique. Pt assisted to EOB with minA, primarily for initiation and trunk management - pt stating "I can't, I can't" and will sometimes try to lay back down in bed - she was redirectable and able to get to EOB. Completed squat<>pivot transfer with modA from EOB to w/c - very fearful and anxious with mobility and this impacts her performance. She's able to reposition herself in w/c without assist. Worked on w/c propulsion - able to propel herself 2x59ft (rest break) with supervision but needing minA to initiate. W/c distance limited by fatigue. Transported remaining distance to ortho rehab gym to work on car transfers. She completed car transfer with modA via squat<>pivot technique - she's able to manage her LE's in and out of the car without  assist and then instruction for repositioning in car. Next, completed seated there-ex in w/c with PT guiding for muslce activation, sequencing, technique. 1x10 reps for each exercise.  Seated there-ex in w/c: -alternating hip marches -alternating LAQ -alternating ankle pumps -shldr flex to 90 deg -bicep curls with 5 lb dowel rod  Pt returned to her room and placed in front of the hospital bed rails that were raised. She completed 1x5 sit<>stands with minA while pulling herself up to stand from bed rail. Cues for glut/thoracic extension, upright posture in standing, and general sequencing. Further efforts deferred from pt due to fatigue. She ended session seated in w/c with breakfast tray setup for her. Safety belt alarm engaged. All needs within reach.   Colby Reels P Joie Reamer PT, DPT 12/11/2020, 8:47 AM

## 2020-12-11 NOTE — Progress Notes (Signed)
Inpatient Rehabilitation Care Coordinator Discharge Note   Patient Details  Name: MCKENZYE CUTRIGHT MRN: [id number] Date of Birth: 05-24-1930   Discharge location: GOING TO [facility]  Length of Stay:  17 DAYS  Discharge activity level: MIN LEVEL OF ASSIST  Home/community participation: ACTIVE  Patient response XY:IAXKPV Literacy - How often do you need to have someone help you when you read instructions, pamphlets, or other written material from your doctor or pharmacy?: Never  Patient response VZ:SMOLMB Isolation - How often do you feel lonely or isolated from those around you?: Patient unable to respond  Services provided included: MD, RD, PT, OT, SLP, RN, CM, Pharmacy, SW  Financial Services:  Field seismologist Utilized: Physiological scientist MEDICARE  Choices offered to/list presented to: [employer] [facility] [employer] [facility]  Follow-up services arranged:  DME, Patient/Family has no preference for HH/DME agencies, Home Health Home Health Agency: [facility] WILL ARRANGE PT, OT,SP FOLLOW UP THEY CONTRACT WITH A HOME HEALTH AGENCY    DME : [facility]    Patient response to transportation need: Is the patient able to respond to transportation needs?: No        Comments (or additional information): [facility] [employer] [facility] ALONG WITH NEPHEW VERY INVOLVED AND SUPPORTIVE. AWARE OF HER LACK OF PARTICIPATION HERE AND HOPEFUL WILL DO BETTER AT ALF WITH PARTICIPATION IN THERAPIES. FAMILY TO TRANSPORT TO FACILITY. BEDSIDE RN TO CALL REPORT TO [phone] ASK FOR RN SUPERVISOR. PACKET GIVEN TO NIECE WITH DNR FORM, ALONG WITH COVID AND TB TEST  Patient/Family verbalized understanding of follow-up arrangements:  Yes  Individual responsible for coordination of the follow-up plan: DANIELLE[facility] [phone]  Confirmed correct DME delivered: Lucy Chris 12/11/2020    Jamarquis Crull, Lemar Livings

## 2020-12-11 NOTE — Progress Notes (Signed)
PROGRESS NOTE   Subjective/Complaints: No complaints this morning Has not eaten breakfast Watching TV Very pleasant and smiling  ROS: Limited due to language/communication   Objective:   No results found. Recent Labs    12/09/20 0528  WBC 6.1  HGB 9.5*  HCT 28.4*  PLT 274     Recent Labs    12/09/20 0528  NA 137  K 3.5  CL 106  CO2 22  GLUCOSE 75  BUN 17  CREATININE 0.97  CALCIUM 8.5*      Intake/Output Summary (Last 24 hours) at 12/11/2020 1122 Last data filed at 12/10/2020 1430 Gross per 24 hour  Intake 118 ml  Output --  Net 118 ml         Physical Exam: Vital Signs Blood pressure (!) 145/56, pulse 87, temperature 98.3 F (36.8 C), temperature source Oral, resp. rate 16, height 5\' 9"  (1.753 m), weight 60.6 kg, SpO2 97 %. Gen: no distress, normal appearing HEENT: oral mucosa pink and moist, NCAT Cardio: Reg rate Chest: normal effort, normal rate of breathing Abd: soft, non-distended Ext: no edema Psych: pleasant, normal affect Skin: intact Neurological: Ox0-1- wasn't able to give me her name without cues  Ext: no edema Psych: pleasant, normal affect, smiling Skin: bruising on both arms Neurological/MSK    Comments: Patient is severely hard of hearing.  Patient is awake alert, eyes are open.  Receptive> expressive aphasia unchanged. She did not follow commands.  Can express herself at phrase level but not answer specific questions   Assessment/Plan: 1. Functional deficits which require 3+ hours per day of interdisciplinary therapy in a comprehensive inpatient rehab setting. Physiatrist is providing close team supervision and 24 hour management of active medical problems listed below. Physiatrist and rehab team continue to assess barriers to discharge/monitor patient progress toward functional and medical goals  Care Tool:  Bathing    Body parts bathed by patient: Right arm, Left  arm, Chest, Abdomen, Face, Right upper leg, Left upper leg   Body parts bathed by helper: Front perineal area, Buttocks, Right lower leg, Left lower leg     Bathing assist Assist Level: 2 Helpers     Upper Body Dressing/Undressing Upper body dressing   What is the patient wearing?: Pull over shirt    Upper body assist Assist Level: Minimal Assistance - Patient > 75%    Lower Body Dressing/Undressing Lower body dressing      What is the patient wearing?: Pants     Lower body assist Assist for lower body dressing: Maximal Assistance - Patient 25 - 49%     Toileting Toileting    Toileting assist Assist for toileting: Maximal Assistance - Patient 25 - 49%     Transfers Chair/bed transfer  Transfers assist     Chair/bed transfer assist level: Moderate Assistance - Patient 50 - 74%     Locomotion Ambulation   Ambulation assist   Ambulation activity did not occur: Refused  Assist level: 2 helpers Assistive device: Rollator Max distance: 51ft   Walk 10 feet activity   Assist  Walk 10 feet activity did not occur: Refused  Assist level: 2 helpers Assistive device: Rollator  Walk 50 feet activity   Assist Walk 50 feet with 2 turns activity did not occur: Refused         Walk 150 feet activity   Assist Walk 150 feet activity did not occur: Refused         Walk 10 feet on uneven surface  activity   Assist Walk 10 feet on uneven surfaces activity did not occur: Refused         Wheelchair     Assist Is the patient using a wheelchair?: Yes Type of Wheelchair: Manual    Wheelchair assist level: Maximal Assistance - Patient 25 - 49% Max wheelchair distance: 21ft    Wheelchair 50 feet with 2 turns activity    Assist        Assist Level: Maximal Assistance - Patient 25 - 49%   Wheelchair 150 feet activity     Assist      Assist Level: Total Assistance - Patient < 25%   Blood pressure (!) 145/56, pulse 87,  temperature 98.3 F (36.8 C), temperature source Oral, resp. rate 16, height 5\' 9"  (1.753 m), weight 60.6 kg, SpO2 97 %.  Medical Problem List and Plan: 1.  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  Impaired mobility: -DVT/anticoagulation:  Mechanical: Sequential compression devices, below knee Bilateral lower extremities             -antiplatelet therapy: Continue Aspirin 81 mg daily and Brilinta 90 mg twice daily 3. Pain Management: N/A 4. Mood: Provide emotional support             -antipsychotic agents: N/A 5. Neuropsych: This patient is not capable of making decisions on her own behalf.  ?increased confusion, incontinence   - UA+, UCx pending   -empiric keflex started 9/10   -seems a little brighter today 9/11- UC with >100,000 aerococcus, switch to amoxicillin- 5 day stop date placed 6. Skin/Wound Care: Routine skin checks 7. Fluids/Electrolytes/Nutrition: Routine in and outs with follow-up chemistries 8.  Hyperlipidemia.  Continue Lipitor 9.  Hypothyroidism.  Continue Synthroid 10.  Hypertension.  Labile, continue Cozaar 100 mg daily, Toprol-XL 50 mg daily.  Increase amlodipine to 5mg . Monitor with increased mobility. Magnesium level reviewed and normal 11.  MASD buttocks and groin.  Zinc oxide as directed with routine skin checks. continue diflucan weekly 12. AKI: Normalized. Placed nursing order to encourage 6-8 glasses of water per day. 13. Hard of hearing: Try to wear see through masks when communicating with her 14. Low protein: continue Juven supplementation.  15. Fatigue: improved, continue Modafinil 100mg .  16. Decreased appetite:    -ask RD for input  -BMET stable, prealbumin 21, continue megace- decrease to 400mg  daily given increased  clot risk with BID dosing as per pharmacy 17. Sudden jerking movements noted by family: decreased Modafinil to 100mg .  18. Constipation: milk of magnesia on 9/9   LOS: 16 days A FACE TO FACE EVALUATION WAS PERFORMED  Zylee Marchiano P Arianie Couse 12/11/2020, 11:22 AM

## 2020-12-11 NOTE — Progress Notes (Signed)
Patient drank 25% of protein drink.

## 2020-12-11 NOTE — Progress Notes (Signed)
Speech Language Pathology Discharge Summary  Patient Details  Name: Maria Dyer MRN: [id number] Date of Birth: 09/05/1930  Today's Date: 12/11/2020 SLP Individual Time: 1030-1100 SLP Individual Time Calculation (min): 30 min   Skilled Therapeutic Interventions:  Patient seen with niece (Nancy) present for education as patient to be discharging to ALF tomorrow. SLP discussed patients level of function in terms of cognition, receptive and expressive language abilities and demonstrated how patient has difficulty demonstrating understanding of written questions. Patient was not able to identify niece by name when asked (via writing on dry erase board). Nancy indicated that patient may have been exhibiting some cognitive changes, questioning "sundowning" as patient would at times talk as if she was still working and ask about family members who had long since passed away. All questions answered to Nancy's satisfaction and she was in agreement with SLP's recommendation for HH SLP services at ALF.    Patient has met 4 of 5 long term goals.  Patient to discharge at overall Mod level.  Reasons goals not met: Patient not consistently communicating wants/needs/thoughts at minA level, and is at min-modA level overall.   Clinical Impression/Discharge Summary: Patient was able to meet 4 of 5 LTG's and at time of discharge, is at a moderateA level for communication and cognition. The goal she did not meet consisted of communicating wants/needs/thoughts at minA level, as at time of discharge she is at a min-modA level for need of written/verbal, visual and gestural cues. Her hearing impairment impacted ability to more clearly determine her level of comprehension. Although she did demonstrate understanding of some phrases written on dry erase board, she had significant difficulty answering any WH (What, where, why) questions and would respond with "I dont know". She improved significantly with her ability to name  objects, object pictures and to name and describe at short phrase level verb/action photos. At time of discharge, she was able to communicate wants/needs with listener providing visual, gestural, verbal/written cues. Patient will benefit from SLP intevention at next venue of care to continue progress and maximize her cognitive-linguistic abilities and improve her ability to functionally communicate her wants/needs/thoughts with others. Education was ongoing throughout course of treatment and completed at discharge.  Care Partner:  Caregiver Able to Provide Assistance: No  Type of Caregiver Assistance: Physical;Cognitive  Recommendation:  Home Health SLP;24 hour supervision/assistance  Rationale for SLP Follow Up: Reduce caregiver burden;Maximize cognitive function and independence;Maximize functional communication   Equipment: None for speech   Reasons for discharge: Discharged from hospital   Patient/Family Agrees with Progress Made and Goals Achieved: Yes    T. , MA, CCC-SLP Speech Therapy  

## 2020-12-11 NOTE — Progress Notes (Signed)
Occupational Therapy Discharge Summary  Patient Details  Name: Maria Dyer MRN: [id number] Date of Birth: 05/06/1930  Today's Date: 12/11/2020 OT Individual Time: 1421-1500 OT Individual Time Calculation (min): 39 min   Session Note:  Pt in wheelchair to start session requesting her shoes by pointing in that direction.  Therapist gave them to her but she needed max assist to donn secondary to not being able to reach down to her feet to remove her gripper socks and donn the slip on shoes.  Therapist then attempted to have her complete sit to stand with use of the RW.  Pt initially declined but therapist persuaded her to try it.  She completed one sit to stand with min assist, but then immediately sat down and would not attempt further, even with max demonstrational cueing and coaxing from therapist.  He was able to wheel her out in the hallway and get her to propel the wheelchair approximately 40' after placement of her hands on the wheels.  She then was rolled the rest of the way down to the ortho gym.  Had her complete 2 sets of 2 min using the BITS while sitting.  Max encouragement to participate in Visual Scanning program with use of BUEs.  Reaction time at 2.5-3 seconds with max encouragement as mentioned before.  Accuracy at less than 33% for both sets secondary to decreased ability to isolate the index finger.  She would allow the back of her hand and fingers to hit the board as well.  Attempted to have her complete UE strengthening with the ergonometer, but she adamantly refused.  Had her return to the room with wheelchair mobility again at approximately 64' with therapist taking her back the rest of the way.  She declined need to toilet and was left sitting up with her call button and phone in reach and safety belt in place.    Patient has met 0 of 6 long term goals due to  decreased ability to actively participate in sessions with resistance to functional movement and activity .  Patient to  discharge at [facility] Max Assist level.  Patient's care partner unavailable to provide the necessary physical and cognitive assistance at discharge.    Reasons goals not met: Pt with decreased active participation in selfcare tasks resulting in the need for greater active assist at min to max assist level for selfcare tasks and toileting.    Recommendation:  Patient will benefit from ongoing skilled OT services in  ALF  to continue to advance functional skills in the area of BADL and Reduce care partner burden.  Pt will benefit from continued ALF level therapy at this time to help increase active initiation and participating as well as increasing independence in selfcare tasks to reduce burden of care.    Equipment: No equipment provided  Reasons for discharge: discharge from hospital  Patient/family agrees with progress made and goals achieved: Yes  OT Discharge Precautions/Restrictions  Precautions Precautions: Fall Precaution Comments: expressive > receptive aphasia, fear of falling, anxiety Restrictions Weight Bearing Restrictions: No  Pain Pain Assessment Pain Scale: Faces Pain Score: 0-No pain ADL ADL Eating: Supervision/safety Where Assessed-Eating: Other (comment) Grooming: Minimal assistance, Supervision/safety Where Assessed-Grooming: Sitting at sink Upper Body Bathing: Minimal assistance Where Assessed-Upper Body Bathing: Sitting at sink, Wheelchair Lower Body Bathing: Maximal assistance Where Assessed-Lower Body Bathing: Standing at sink, Sitting at sink Upper Body Dressing: Minimal assistance Where Assessed-Upper Body Dressing: Wheelchair Lower Body Dressing: Maximal assistance Where Assessed-Lower Body Dressing: Standing  at sink, Sitting at sink Toileting: Dependent Where Assessed-Toileting: Bedside Commode Toilet Transfer: Maximal assistance Toilet Transfer Method: Stand pivot Toilet Transfer Equipment: Bedside commode Tub/Shower Transfer: Not  assessed Social research officer, government: Not assessed Vision Baseline Vision/History: 1 Wears glasses Patient Visual Report: No change from baseline Vision Assessment?: No apparent visual deficits Perception  Perception: Within Functional Limits Praxis Praxis: Impaired Praxis Impairment Details: Motor planning;Initiation Cognition Overall Cognitive Status: Impaired/Different from baseline Arousal/Alertness: Awake/alert Orientation Level:  (Unable to deternine secondary to global aphasia) Year:  (Unable to deternine secondary to global aphasia) Month:  (Unable to deternine secondary to global aphasia) Day of Week:  (Unable to deternine secondary to global aphasia) Attention: Focused;Sustained Focused Attention: Impaired Focused Attention Impairment: Functional basic Sustained Attention: Impaired Sustained Attention Impairment: Functional basic Memory: Impaired Memory Impairment: Decreased recall of new information;Decreased short term memory;Decreased long term memory;Storage deficit Immediate Memory Recall:  (Unable to deternine secondary to global aphasia) Memory Recall Sock:  (Unable to deternine secondary to global aphasia) Awareness: Impaired Awareness Impairment: Intellectual impairment Problem Solving: Impaired Problem Solving Impairment: Verbal basic;Functional basic Sensation Sensation Light Touch: Appears Intact Hot/Cold: Appears Intact Proprioception: Appears Intact Stereognosis: Not tested Additional Comments: Functionally, appears intact - UTA due to cognition and aphasia. Coordination Gross Motor Movements are Fluid and Coordinated: Yes Fine Motor Movements are Fluid and Coordinated: Yes Motor  Motor Motor: Motor apraxia;Hemiplegia;Abnormal postural alignment and control Motor - Skilled Clinical Observations: Mild R hemi Motor - Discharge Observations: Global deconditioning and weakness Mobility  Bed Mobility Bed Mobility: Rolling Right;Rolling Left;Supine to  Sit;Sit to Supine Rolling Right: Supervision/verbal cueing Rolling Left: Supervision/Verbal cueing Supine to Sit: Minimal Assistance - Patient > 75% Sit to Supine: Moderate Assistance - Patient 50-74% Transfers Sit to Stand: Minimal Assistance - Patient > 75% Stand to Sit: Minimal Assistance - Patient > 75%  Trunk/Postural Assessment  Cervical Assessment Cervical Assessment: Exceptions to [facility] (forward head) Thoracic Assessment Thoracic Assessment: Exceptions to [facility] (thoracic kyphosis) Lumbar Assessment Lumbar Assessment: Exceptions to [facility] (posterior pelvic tilt)  Balance Balance Balance Assessed: Yes Static Sitting Balance Static Sitting - Balance Support: Feet supported;No upper extremity supported Static Sitting - Level of Assistance: 5: Stand by assistance Dynamic Sitting Balance Dynamic Sitting - Balance Support: Feet supported;During functional activity Dynamic Sitting - Level of Assistance: 4: Min assist Static Standing Balance Static Standing - Balance Support: Bilateral upper extremity supported Static Standing - Level of Assistance: 4: Min assist Dynamic Standing Balance Dynamic Standing - Balance Support: During functional activity Dynamic Standing - Level of Assistance: 2: Max assist Extremity/Trunk Assessment RUE Assessment RUE Assessment: Exceptions to [facility] Active Range of Motion (AROM) Comments: WFLS for gross assessment General Strength Comments: Strength 3+/5 LUE Assessment LUE Assessment: Exceptions to [facility] Active Range of Motion (AROM) Comments: AROM WFLS grossly General Strength Comments: strength 3+/5 throughout   Helaman Mecca OTR/L 12/11/2020, 4:13 PM

## 2020-12-11 NOTE — Progress Notes (Addendum)
Patient ID: Gates Rigg, female   DOB: 1930-06-27, 87 y.o.   MRN: [id number] Spoke with Patty-[facility] to confirm transfer tomorrow. COVID test done this am and TB to be read tomorrow am prior to discharge. Will check on hospital bed and wheelchair delivery. Plan for transfer tomorrow and family to transport to facility between 9:00-10:00.  11:40 AM Met with Nancy-niece who is here to confirm transfer tomorrow. Wants a DNR form for facility. Dan-PA aware and agreeable to complete form for out of facility DNR. Will see family in am to give packet for [facility].

## 2020-12-11 NOTE — Progress Notes (Signed)
Patient drank half of ensure at bf and less than 25% at 2p.

## 2020-12-12 NOTE — Progress Notes (Signed)
PA Dan in with pt to discuss discharge instructions. Pt/family in agreement. No further questions or concerns. SW in to see pt. Meds returned to pt/famiy. Belongings gathered. Pt left per wheelchair to private vehicle for [facility]. Report given to nurse at [facility]. No complications noted.  Mylo Red, LPN

## 2020-12-12 NOTE — Progress Notes (Signed)
PROGRESS NOTE   Subjective/Complaints: No complaints this morning Ready for d/c to SNF  ROS: Limited due to language/communication   Objective:   No results found. No results for input(s): WBC, HGB, HCT, PLT in the last 72 hours.    No results for input(s): NA, K, CL, CO2, GLUCOSE, BUN, CREATININE, CALCIUM in the last 72 hours.     Intake/Output Summary (Last 24 hours) at 12/12/2020 0918 Last data filed at 12/12/2020 0909 Gross per 24 hour  Intake 537 ml  Output --  Net 537 ml         Physical Exam: Vital Signs Blood pressure (!) 146/58, pulse 84, temperature 98.4 F (36.9 C), resp. rate 17, height 5\' 9"  (1.753 m), weight 60.6 kg, SpO2 96 %. Gen: no distress, normal appearing HEENT: oral mucosa pink and moist, NCAT Cardio: Reg rate Chest: normal effort, normal rate of breathing Abd: soft, non-distended Ext: no edema Psych: pleasant, normal affect Skin: intact Neurological: Ox0-1- wasn't able to give me her name without cues  Ext: no edema Psych: pleasant, normal affect, smiling Skin: bruising on both arms Neurological/MSK    Comments: Patient is severely hard of hearing.  Patient is awake alert, eyes are open.  Receptive> expressive aphasia unchanged. She did not follow commands.  Can express herself at phrase level but not answer specific questions   Assessment/Plan: 1. Functional deficits which require 3+ hours per day of interdisciplinary therapy in a comprehensive inpatient rehab setting. Physiatrist is providing close team supervision and 24 hour management of active medical problems listed below. Physiatrist and rehab team continue to assess barriers to discharge/monitor patient progress toward functional and medical goals  Care Tool:  Bathing    Body parts bathed by patient: Right arm, Left arm, Chest, Abdomen, Face, Right upper leg, Left upper leg   Body parts bathed by helper: Front perineal  area, Buttocks, Right lower leg, Left lower leg     Bathing assist Assist Level: Maximal Assistance - Patient 24 - 49%     Upper Body Dressing/Undressing Upper body dressing   What is the patient wearing?: Pull over shirt    Upper body assist Assist Level: Minimal Assistance - Patient > 75%    Lower Body Dressing/Undressing Lower body dressing      What is the patient wearing?: Pants     Lower body assist Assist for lower body dressing: Maximal Assistance - Patient 25 - 49%     Toileting Toileting    Toileting assist Assist for toileting: Total Assistance - Patient < 25%     Transfers Chair/bed transfer  Transfers assist     Chair/bed transfer assist level: Moderate Assistance - Patient 50 - 74%     Locomotion Ambulation   Ambulation assist   Ambulation activity did not occur: Refused  Assist level: 2 helpers Assistive device: Rollator Max distance: 90ft   Walk 10 feet activity   Assist  Walk 10 feet activity did not occur: Refused  Assist level: 2 helpers Assistive device: Rollator   Walk 50 feet activity   Assist Walk 50 feet with 2 turns activity did not occur: Refused  Walk 150 feet activity   Assist Walk 150 feet activity did not occur: Refused         Walk 10 feet on uneven surface  activity   Assist Walk 10 feet on uneven surfaces activity did not occur: Refused         Wheelchair     Assist Is the patient using a wheelchair?: Yes Type of Wheelchair: Manual    Wheelchair assist level: Maximal Assistance - Patient 25 - 49% Max wheelchair distance: 27ft    Wheelchair 50 feet with 2 turns activity    Assist        Assist Level: Maximal Assistance - Patient 25 - 49%   Wheelchair 150 feet activity     Assist      Assist Level: Total Assistance - Patient < 25%   Blood pressure (!) 146/58, pulse 84, temperature 98.4 F (36.9 C), resp. rate 17, height 5\' 9"  (1.753 m), weight 60.6 kg, SpO2  96 %.  Medical Problem List and Plan: 1.  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  Impaired mobility: -DVT/anticoagulation:  Mechanical: Sequential compression devices, below knee Bilateral lower extremities             -antiplatelet therapy: Continue Aspirin 81 mg daily and Brilinta 90 mg twice daily 3. Pain Management: N/A 4. Mood: Provide emotional support             -antipsychotic agents: N/A 5. Neuropsych: This patient is not capable of making decisions on her own behalf.  ?increased confusion, incontinence   - UA+, UCx pending   -empiric keflex started 9/10   -seems a little brighter today 9/11- UC with >100,000 aerococcus, switch to amoxicillin- 5 day stop date placed 6. Skin/Wound Care: Routine skin checks 7. Fluids/Electrolytes/Nutrition: Routine in and outs with follow-up chemistries 8.  Hyperlipidemia.  Continue Lipitor 9.  Hypothyroidism.  Continue Synthroid 10.  Hypertension.  Labile, continue Cozaar 100 mg daily, Toprol-XL 50 mg daily.  Increase amlodipine to 5mg . Monitor with increased mobility. Magnesium level reviewed and normal 11.  MASD buttocks and groin.  Zinc oxide as directed with routine skin checks. continue diflucan weekly 12. AKI: Normalized. Placed nursing order to encourage 6-8 glasses of water per day. 13. Hard of hearing: Try to wear see through masks when communicating with her 14. Low protein: continue Juven supplementation.  15. Fatigue: improved, continue Modafinil 100mg .  16. Decreased appetite:    -ask RD for input  -BMET stable, prealbumin 21, continue megace- decrease to 400mg  daily given increased clot risk with BID dosing as per pharmacy 17. Sudden jerking movements noted by family: decreased Modafinil to  100mg .  18. Constipation: milk of magnesia on 9/9   >30 minutes spent in discharge of patient including review of medications and follow-up appointments, physical examination, and in answering all patient's questions    LOS: 17 days A FACE TO FACE EVALUATION WAS PERFORMED  P Luanna Weesner 12/12/2020, 9:18 AM

## 2020-12-12 NOTE — Progress Notes (Signed)
Report given to memory care manager-Michael. No further questions. Mylo Red, LPN

## 2020-12-12 NOTE — Progress Notes (Addendum)
Patient ID: Maria Dyer, female   DOB: [birth year], 87 y.o.   MRN: [id number]  faxed results of COVID and TB and spoke with Patty-ADM of [facility]. They have all information and are ready to admit her today. Family is there moving in her things. Will come here around 9-10 and will give them the packet. Set for discharge today. Will give Ashley-RN number for report [phone] ask for Supervisor in charge of ALF

## 2020-12-16 ENCOUNTER — Inpatient Hospital Stay (HOSPITAL_COMMUNITY)
Admission: EM | Admit: 2020-12-16 | Discharge: 2020-12-18 | DRG: 377 | Disposition: A | Discharge status: Hospice | Payer: Medicare HMO | Source: Skilled Nursing Facility | Attending: Internal Medicine | Admitting: Internal Medicine

## 2020-12-16 DIAGNOSIS — Z66 Do not resuscitate: Secondary | ICD-10-CM | POA: Diagnosis present

## 2020-12-16 DIAGNOSIS — I639 Cerebral infarction, unspecified: Secondary | ICD-10-CM | POA: Diagnosis not present

## 2020-12-16 DIAGNOSIS — R58 Hemorrhage, not elsewhere classified: Secondary | ICD-10-CM | POA: Diagnosis not present

## 2020-12-16 DIAGNOSIS — E039 Hypothyroidism, unspecified: Secondary | ICD-10-CM | POA: Diagnosis present

## 2020-12-16 DIAGNOSIS — Z515 Encounter for palliative care: Secondary | ICD-10-CM

## 2020-12-16 DIAGNOSIS — F0390 Unspecified dementia without behavioral disturbance: Secondary | ICD-10-CM | POA: Diagnosis present

## 2020-12-16 DIAGNOSIS — L8990 Pressure ulcer of unspecified site, unspecified stage: Secondary | ICD-10-CM

## 2020-12-16 DIAGNOSIS — K922 Gastrointestinal hemorrhage, unspecified: Secondary | ICD-10-CM | POA: Diagnosis present

## 2020-12-16 DIAGNOSIS — K625 Hemorrhage of anus and rectum: Secondary | ICD-10-CM | POA: Diagnosis present

## 2020-12-16 DIAGNOSIS — I959 Hypotension, unspecified: Secondary | ICD-10-CM | POA: Diagnosis not present

## 2020-12-16 DIAGNOSIS — R32 Unspecified urinary incontinence: Secondary | ICD-10-CM | POA: Diagnosis present

## 2020-12-16 DIAGNOSIS — I63512 Cerebral infarction due to unspecified occlusion or stenosis of left middle cerebral artery: Secondary | ICD-10-CM | POA: Diagnosis present

## 2020-12-16 DIAGNOSIS — Z853 Personal history of malignant neoplasm of breast: Secondary | ICD-10-CM

## 2020-12-16 DIAGNOSIS — K921 Melena: Principal | ICD-10-CM | POA: Diagnosis present

## 2020-12-16 DIAGNOSIS — I69320 Aphasia following cerebral infarction: Secondary | ICD-10-CM

## 2020-12-16 DIAGNOSIS — Z20822 Contact with and (suspected) exposure to covid-19: Secondary | ICD-10-CM | POA: Diagnosis present

## 2020-12-16 DIAGNOSIS — E872 Acidosis: Secondary | ICD-10-CM | POA: Diagnosis present

## 2020-12-16 DIAGNOSIS — Z79890 Hormone replacement therapy (postmenopausal): Secondary | ICD-10-CM

## 2020-12-16 DIAGNOSIS — Z79899 Other long term (current) drug therapy: Secondary | ICD-10-CM

## 2020-12-16 DIAGNOSIS — L89159 Pressure ulcer of sacral region, unspecified stage: Secondary | ICD-10-CM | POA: Diagnosis not present

## 2020-12-16 DIAGNOSIS — L89312 Pressure ulcer of right buttock, stage 2: Secondary | ICD-10-CM | POA: Diagnosis present

## 2020-12-16 DIAGNOSIS — L89322 Pressure ulcer of left buttock, stage 2: Secondary | ICD-10-CM | POA: Diagnosis present

## 2020-12-16 DIAGNOSIS — E782 Mixed hyperlipidemia: Secondary | ICD-10-CM | POA: Diagnosis not present

## 2020-12-16 DIAGNOSIS — Z6827 Body mass index (BMI) 27.0-27.9, adult: Secondary | ICD-10-CM

## 2020-12-16 DIAGNOSIS — Z7902 Long term (current) use of antithrombotics/antiplatelets: Secondary | ICD-10-CM

## 2020-12-16 DIAGNOSIS — I10 Essential (primary) hypertension: Secondary | ICD-10-CM | POA: Diagnosis not present

## 2020-12-16 DIAGNOSIS — N179 Acute kidney failure, unspecified: Secondary | ICD-10-CM | POA: Diagnosis present

## 2020-12-16 DIAGNOSIS — E1122 Type 2 diabetes mellitus with diabetic chronic kidney disease: Secondary | ICD-10-CM | POA: Diagnosis present

## 2020-12-16 DIAGNOSIS — R627 Adult failure to thrive: Secondary | ICD-10-CM | POA: Diagnosis present

## 2020-12-16 DIAGNOSIS — D62 Acute posthemorrhagic anemia: Secondary | ICD-10-CM | POA: Diagnosis present

## 2020-12-16 DIAGNOSIS — Z7982 Long term (current) use of aspirin: Secondary | ICD-10-CM

## 2020-12-16 DIAGNOSIS — G9341 Metabolic encephalopathy: Secondary | ICD-10-CM | POA: Diagnosis present

## 2020-12-16 DIAGNOSIS — I129 Hypertensive chronic kidney disease with stage 1 through stage 4 chronic kidney disease, or unspecified chronic kidney disease: Secondary | ICD-10-CM | POA: Diagnosis present

## 2020-12-16 DIAGNOSIS — N1831 Chronic kidney disease, stage 3a: Secondary | ICD-10-CM | POA: Diagnosis present

## 2020-12-16 LAB — I-STAT CHEM 8, ED
BUN: 38 mg/dL — ABNORMAL HIGH (ref 8–23)
Calcium, Ion: 1.13 mmol/L — ABNORMAL LOW (ref 1.15–1.40)
Chloride: 106 mmol/L (ref 98–111)
Creatinine, Ser: 2.2 mg/dL — ABNORMAL HIGH (ref 0.44–1.00)
Glucose, Bld: 108 mg/dL — ABNORMAL HIGH (ref 70–99)
HCT: 33 % — ABNORMAL LOW (ref 36.0–46.0)
Hemoglobin: 11.2 g/dL — ABNORMAL LOW (ref 12.0–15.0)
Potassium: 4.3 mmol/L (ref 3.5–5.1)
Sodium: 137 mmol/L (ref 135–145)
TCO2: 21 mmol/L — ABNORMAL LOW (ref 22–32)

## 2020-12-16 LAB — CBC WITH DIFFERENTIAL/PLATELET
Abs Immature Granulocytes: 0.05 10*3/uL (ref 0.00–0.07)
Basophils Absolute: 0.1 10*3/uL (ref 0.0–0.1)
Basophils Relative: 1 %
Eosinophils Absolute: 0.3 10*3/uL (ref 0.0–0.5)
Eosinophils Relative: 4 %
HCT: 34.1 % — ABNORMAL LOW (ref 36.0–46.0)
Hemoglobin: 10.8 g/dL — ABNORMAL LOW (ref 12.0–15.0)
Immature Granulocytes: 1 %
Lymphocytes Relative: 14 %
Lymphs Abs: 1.1 10*3/uL (ref 0.7–4.0)
MCH: 31.9 pg (ref 26.0–34.0)
MCHC: 31.7 g/dL (ref 30.0–36.0)
MCV: 100.6 fL — ABNORMAL HIGH (ref 80.0–100.0)
Monocytes Absolute: 0.7 10*3/uL (ref 0.1–1.0)
Monocytes Relative: 9 %
Neutro Abs: 5.9 10*3/uL (ref 1.7–7.7)
Neutrophils Relative %: 71 %
Platelets: 448 10*3/uL — ABNORMAL HIGH (ref 150–400)
RBC: 3.39 MIL/uL — ABNORMAL LOW (ref 3.87–5.11)
RDW: 14.7 % (ref 11.5–15.5)
WBC: 8.1 10*3/uL (ref 4.0–10.5)
nRBC: 0 % (ref 0.0–0.2)

## 2020-12-16 LAB — POC OCCULT BLOOD, ED: Fecal Occult Bld: POSITIVE — AB

## 2020-12-16 MED ORDER — SODIUM CHLORIDE 0.9 % IV BOLUS
500.0000 mL | Freq: Once | INTRAVENOUS | Status: AC
Start: 2020-12-16 — End: 2020-12-17
  Administered 2020-12-16: 500 mL via INTRAVENOUS

## 2020-12-16 NOTE — ED Notes (Signed)
Pt is very HOH and had a stroke in August and unable to communicate very well. Pt niece is at bedside. Pt 2 person assist into bed. Per pt niece and paperwork pt is on blood thinner.

## 2020-12-16 NOTE — ED Triage Notes (Signed)
Pt here from [facility] with Gi bleed , just once episode, no blood thinners, pt has no complaints , pt is very hoh

## 2020-12-16 NOTE — ED Provider Notes (Signed)
[facility] EMERGENCY DEPARTMENT Provider Note   CSN: [id number] Arrival date & time: 12/16/20  1446     History No chief complaint on file.   Maria Dyer is a 87 y.o. female.  Patient presents to the emergency department for evaluation of GI bleed.  Patient is currently living in assisted care.  She had a stroke, prior to the stroke she was in independent living.  Patient is currently on aspirin and Brilinta.  Patient's niece accompanies her to the emergency department.  She has become very fearful after her stroke, does not ambulate much.  Patient was noted to have blood in her adult diaper tonight.  She denies any pain.      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     No family history on file.  Social History   Tobacco Use   Smoking status: Never   Smokeless tobacco: Never    Home Medications Prior to Admission medications   Medication Sig Start Date End Date Taking? Authorizing Provider  amLODipine (NORVASC) 5 MG tablet Take 1 tablet (5 mg total) by mouth daily. 12/11/20  Yes Angiulli, Mcarthur Rossetti, PA-C  aspirin EC 81 MG tablet Take 81 mg by mouth daily. Swallow whole.   Yes [provider]  atorvastatin (LIPITOR) 40 MG tablet Take 1 tablet (40 mg total) by mouth daily.  12/11/20  Yes Angiulli, Mcarthur Rossetti, PA-C  cephALEXin (KEFLEX) 500 MG capsule Take 500 mg by mouth 2 (two) times daily. Continuous 12/12/20  Yes [provider]  fluconazole (DIFLUCAN) 150 MG tablet Take 1 tablet (150 mg total) by mouth once a week. 12/17/20  Yes Angiulli, Mcarthur Rossetti, PA-C  GOODSENSE ARTHRITIS PAIN 650 MG CR tablet Take 650 mg by mouth every 4 (four) hours as needed. 12/12/20  Yes [provider]  levothyroxine (SYNTHROID) 50 MCG tablet Take 1 tablet (50 mcg total) by mouth daily. 12/11/20  Yes Angiulli, Mcarthur Rossetti, PA-C  losartan (COZAAR) 100 MG tablet Take 1 tablet (100 mg total) by mouth daily. 12/11/20  Yes Angiulli, Mcarthur Rossetti, PA-C  metoprolol succinate (TOPROL-XL) 50 MG 24 hr tablet Take 1 tablet (50 mg total) by mouth daily. Take with or immediately following a meal. 12/11/20  Yes Angiulli, Mcarthur Rossetti, PA-C  modafinil (PROVIGIL) 100 MG tablet Take 100 mg by mouth daily.   Yes [provider]  ticagrelor (BRILINTA) 90 MG TABS tablet Take 1 tablet (90 mg total) by mouth 2 (two) times daily. 12/11/20  Yes Angiulli, Mcarthur Rossetti, PA-C  zinc oxide 20 % ointment Apply 1 application topically See admin instructions. 3 times a week as needed for irritation   Yes [provider]  acetaminophen (TYLENOL) 325 MG tablet Take 2 tablets (650  mg total) by mouth every 4 (four) hours as needed for mild pain (or temp > 37.5 C (99.5 F)). Patient not taking: No sig reported 12/11/20   Angiulli, Mcarthur Rossetti, PA-C  aspirin 81 MG chewable tablet Chew 1 tablet (81 mg total) by mouth daily. Patient not taking: No sig reported 11/26/20   Merry Lofty, NP  magic mouthwash SOLN Take 5 mLs by mouth See admin instructions. Swish and spit 5 mls 4 times daily x 10 days    [provider]    Allergies    Enalapril, Fosinopril, and Lisinopril  Review of Systems   Review of Systems  Gastrointestinal:  Positive for blood in stool.  All other systems reviewed and are  negative.  Physical Exam Updated Vital Signs BP (!) 136/45   Pulse 60   Temp 97.6 F (36.4 C) (Oral)   Resp (!) 22   Ht 5' (1.524 m)   Wt 63.5 kg   SpO2 100%   BMI 27.34 kg/m   Physical Exam Vitals and nursing note reviewed.  Constitutional:      General: She is not in acute distress.    Appearance: Normal appearance. She is well-developed.  HENT:     Head: Normocephalic and atraumatic.     Right Ear: Hearing normal.     Left Ear: Hearing normal.     Nose: Nose normal.  Eyes:     Conjunctiva/sclera: Conjunctivae normal.     Pupils: Pupils are equal, round, and reactive to light.  Cardiovascular:     Rate and Rhythm: Regular rhythm.     Heart sounds: S1 normal and S2 normal. No murmur heard.   No friction rub. No gallop.  Pulmonary:     Effort: Pulmonary effort is normal. No respiratory distress.     Breath sounds: Normal breath sounds.  Chest:     Chest wall: No tenderness.  Abdominal:     General: Bowel sounds are normal.     Palpations: Abdomen is soft.     Tenderness: There is no abdominal tenderness. There is no guarding or rebound. Negative signs include Murphy's sign and McBurney's sign.     Hernia: No hernia is present.  Musculoskeletal:        General: Normal range of motion.     Cervical back: Normal range of motion and neck supple.  Skin:    General: Skin is warm and dry.     Findings: No rash.  Neurological:     Mental Status: She is alert.     GCS: GCS eye subscore is 4. GCS verbal subscore is 5. GCS motor subscore is 6.  Psychiatric:        Speech: Speech normal.        Behavior: Behavior normal.        Thought Content: Thought content normal.    ED Results / Procedures / Treatments   Labs (all labs ordered are listed, but only abnormal results are displayed) Labs Reviewed  CBC WITH DIFFERENTIAL/PLATELET - Abnormal; Notable for the following components:      Result Value   RBC 3.39 (*)    Hemoglobin 10.8 (*)    HCT 34.1 (*)    MCV 100.6  (*)    Platelets 448 (*)    All other components within normal limits  COMPREHENSIVE METABOLIC PANEL - Abnormal; Notable for the following components:   CO2 18 (*)    Glucose, Bld 110 (*)    BUN 34 (*)  Creatinine, Ser 2.29 (*)    Albumin 3.4 (*)    GFR, Estimated 20 (*)    All other components within normal limits  LACTIC ACID, PLASMA - Abnormal; Notable for the following components:   Lactic Acid, Venous 2.4 (*)    All other components within normal limits  I-STAT CHEM 8, ED - Abnormal; Notable for the following components:   BUN 38 (*)    Creatinine, Ser 2.20 (*)    Glucose, Bld 108 (*)    Calcium, Ion 1.13 (*)    TCO2 21 (*)    Hemoglobin 11.2 (*)    HCT 33.0 (*)    All other components within normal limits  POC OCCULT BLOOD, ED - Abnormal; Notable for the following components:   Fecal Occult Bld POSITIVE (*)    All other components within normal limits  RESP PANEL BY RT-PCR (FLU A&B, COVID) [id number]  PROTIME-INR  URINALYSIS, ROUTINE W REFLEX MICROSCOPIC  TYPE AND SCREEN  ABO/RH    EKG EKG Interpretation  Date/Time:  Monday December 16 2020 23:12:46 EDT Ventricular Rate:  79 PR Interval:  160 QRS Duration: 116 QT Interval:  428 QTC Calculation: 491 R Axis:   -16 Text Interpretation: Sinus rhythm Consider left atrial enlargement Incomplete RBBB and LAFB Low voltage, precordial leads Confirmed by Gilda Crease [phone]) on 12/16/2020 11:37:33 PM  Radiology CT ABDOMEN PELVIS WO CONTRAST  Result Date: 12/17/2020 CLINICAL DATA:  87 year old with abdominal pain today. Diverticulitis suspected. EXAM: CT ABDOMEN AND PELVIS WITHOUT CONTRAST TECHNIQUE: Multidetector CT imaging of the abdomen and pelvis was performed following the standard protocol without IV contrast. COMPARISON:  None. FINDINGS: Lower chest: Breathing motion artifact. Suspected emphysema. The heart is normal in size. Dense mitral annulus calcifications. Coronary artery calcifications. Hepatobiliary:  Motion artifact through the upper abdomen. No evidence of focal hepatic abnormality. Curvilinear calcification in the region of the gallbladder fundus may be gallstone or wall calcification. There is no abnormal gallbladder distension. Common bile duct measures 10 mm, mildly dilated but may be normal for age. No visualized choledocholithiasis. Pancreas: Parenchymal atrophy. No ductal dilatation or inflammation. Spleen: Normal in size without focal abnormality. Adrenals/Urinary Tract: Normal adrenal glands. No hydronephrosis or perinephric edema. Renal calculi. No evidence of focal renal abnormality on this unenhanced exam. Decompressed ureters. Unremarkable urinary bladder. Stomach/Bowel: The stomach is nondistended. No small bowel obstruction or inflammatory change. Appendix is tentatively visualized and normal. There is no appendicitis. Small to moderate volume of stool throughout the colon. No diverticulitis or colonic inflammation. Vascular/Lymphatic: Moderate aortic and branch atherosclerosis. No aortic aneurysm. There is no portal venous or mesenteric gas. No perivascular stranding in the left groin at site of recent vascular access. Small retroperitoneal lymph nodes are not enlarged by size criteria. No bulky abdominopelvic adenopathy Reproductive: Normal for age uterine atrophy. Quiescent ovaries. No adnexal mass. Other: No free air, free fluid, or intra-abdominal fluid collection. No abdominal wall hernia. Musculoskeletal: Scoliosis and degenerative change in the spine. Degenerative change in both hips. There are no acute or suspicious osseous abnormalities. IMPRESSION: 1. No acute abnormality or explanation for abdominal pain. 2. Curvilinear calcification in the region of the gallbladder fundus may be gallstone or wall calcification. No abnormal gallbladder distension. Aortic Atherosclerosis (ICD10-I70.0). Electronically Signed   By: Narda Rutherford M.D.   On: 12/17/2020 01:00    Procedures Procedures    Medications Ordered in ED Medications  sodium chloride 0.9 % bolus 500 mL (0 mLs Intravenous Stopped 12/17/20 0138)  ED Course  I have reviewed the triage vital signs and the nursing notes.  Pertinent labs & imaging results that were available during my care of the patient were reviewed by me and considered in my medical decision making (see chart for details).    MDM Rules/Calculators/A&P                           Patient presents to the emergency department for rectal bleeding.  Patient was admitted on August 23 for acute ischemic stroke.  During that hospital stay patient had mechanical thrombectomy and intracranial stenting.  She was started on aspirin and Brilinta secondary to the stenting.  Patient, who was previously able to live independently, was discharged to an assisted living facility.  Family members report that she has not been doing well in the assisted facility.  She is afraid that she is going to fall, so has been staying in her bed full-time.  She has been incontinent of urine.  Patient likely is not eating or drinking.  She has a new acute kidney injury that has developed since discharge a week ago.  We will give IV fluids.  Patient's hemoglobin is stable.  With intracranial stenting, however, would not recommend stopping Brilinta at this point.  Will need to be observed in the hospital for serial H&H.  CT abdomen and pelvis did not show any acute inflammatory process to explain the bleeding.  She is stable at this time.  Admit to hospitalist service.    Final Clinical Impression(s) / ED Diagnoses Final diagnoses:  Gastrointestinal hemorrhage, unspecified gastrointestinal hemorrhage type    Rx / DC Orders ED Discharge Orders     None        Cassia Fein, Canary Brim, MD 12/17/20 0205

## 2020-12-17 ENCOUNTER — Emergency Department (HOSPITAL_COMMUNITY): Payer: Medicare HMO

## 2020-12-17 ENCOUNTER — Encounter (HOSPITAL_COMMUNITY): Payer: Self-pay | Admitting: Internal Medicine

## 2020-12-17 DIAGNOSIS — N200 Calculus of kidney: Secondary | ICD-10-CM | POA: Diagnosis not present

## 2020-12-17 DIAGNOSIS — K828 Other specified diseases of gallbladder: Secondary | ICD-10-CM | POA: Diagnosis not present

## 2020-12-17 DIAGNOSIS — F0390 Unspecified dementia without behavioral disturbance: Secondary | ICD-10-CM | POA: Diagnosis present

## 2020-12-17 DIAGNOSIS — Z79899 Other long term (current) drug therapy: Secondary | ICD-10-CM | POA: Diagnosis not present

## 2020-12-17 DIAGNOSIS — I129 Hypertensive chronic kidney disease with stage 1 through stage 4 chronic kidney disease, or unspecified chronic kidney disease: Secondary | ICD-10-CM | POA: Diagnosis present

## 2020-12-17 DIAGNOSIS — Z7189 Other specified counseling: Secondary | ICD-10-CM | POA: Diagnosis not present

## 2020-12-17 DIAGNOSIS — Z515 Encounter for palliative care: Secondary | ICD-10-CM

## 2020-12-17 DIAGNOSIS — Z789 Other specified health status: Secondary | ICD-10-CM | POA: Diagnosis not present

## 2020-12-17 DIAGNOSIS — Z7902 Long term (current) use of antithrombotics/antiplatelets: Secondary | ICD-10-CM | POA: Diagnosis not present

## 2020-12-17 DIAGNOSIS — E1122 Type 2 diabetes mellitus with diabetic chronic kidney disease: Secondary | ICD-10-CM | POA: Diagnosis present

## 2020-12-17 DIAGNOSIS — Z66 Do not resuscitate: Secondary | ICD-10-CM | POA: Diagnosis not present

## 2020-12-17 DIAGNOSIS — K625 Hemorrhage of anus and rectum: Secondary | ICD-10-CM

## 2020-12-17 DIAGNOSIS — D62 Acute posthemorrhagic anemia: Secondary | ICD-10-CM | POA: Diagnosis not present

## 2020-12-17 DIAGNOSIS — N1831 Chronic kidney disease, stage 3a: Secondary | ICD-10-CM | POA: Diagnosis present

## 2020-12-17 DIAGNOSIS — Z711 Person with feared health complaint in whom no diagnosis is made: Secondary | ICD-10-CM

## 2020-12-17 DIAGNOSIS — E872 Acidosis: Secondary | ICD-10-CM | POA: Diagnosis not present

## 2020-12-17 DIAGNOSIS — I10 Essential (primary) hypertension: Secondary | ICD-10-CM | POA: Diagnosis not present

## 2020-12-17 DIAGNOSIS — I63512 Cerebral infarction due to unspecified occlusion or stenosis of left middle cerebral artery: Secondary | ICD-10-CM

## 2020-12-17 DIAGNOSIS — R627 Adult failure to thrive: Secondary | ICD-10-CM | POA: Diagnosis present

## 2020-12-17 DIAGNOSIS — Z7982 Long term (current) use of aspirin: Secondary | ICD-10-CM | POA: Diagnosis not present

## 2020-12-17 DIAGNOSIS — L89322 Pressure ulcer of left buttock, stage 2: Secondary | ICD-10-CM | POA: Diagnosis present

## 2020-12-17 DIAGNOSIS — K922 Gastrointestinal hemorrhage, unspecified: Secondary | ICD-10-CM | POA: Diagnosis present

## 2020-12-17 DIAGNOSIS — K8689 Other specified diseases of pancreas: Secondary | ICD-10-CM | POA: Diagnosis not present

## 2020-12-17 DIAGNOSIS — I69320 Aphasia following cerebral infarction: Secondary | ICD-10-CM | POA: Diagnosis not present

## 2020-12-17 DIAGNOSIS — K838 Other specified diseases of biliary tract: Secondary | ICD-10-CM | POA: Diagnosis not present

## 2020-12-17 DIAGNOSIS — R32 Unspecified urinary incontinence: Secondary | ICD-10-CM | POA: Diagnosis present

## 2020-12-17 DIAGNOSIS — E039 Hypothyroidism, unspecified: Secondary | ICD-10-CM | POA: Diagnosis present

## 2020-12-17 DIAGNOSIS — G9341 Metabolic encephalopathy: Secondary | ICD-10-CM | POA: Diagnosis not present

## 2020-12-17 DIAGNOSIS — L89312 Pressure ulcer of right buttock, stage 2: Secondary | ICD-10-CM | POA: Diagnosis not present

## 2020-12-17 DIAGNOSIS — Z20822 Contact with and (suspected) exposure to covid-19: Secondary | ICD-10-CM | POA: Diagnosis not present

## 2020-12-17 DIAGNOSIS — N179 Acute kidney failure, unspecified: Secondary | ICD-10-CM | POA: Diagnosis present

## 2020-12-17 DIAGNOSIS — Z6827 Body mass index (BMI) 27.0-27.9, adult: Secondary | ICD-10-CM | POA: Diagnosis not present

## 2020-12-17 DIAGNOSIS — Z79890 Hormone replacement therapy (postmenopausal): Secondary | ICD-10-CM | POA: Diagnosis not present

## 2020-12-17 DIAGNOSIS — Z853 Personal history of malignant neoplasm of breast: Secondary | ICD-10-CM | POA: Diagnosis not present

## 2020-12-17 DIAGNOSIS — K921 Melena: Secondary | ICD-10-CM | POA: Diagnosis not present

## 2020-12-17 LAB — BASIC METABOLIC PANEL
Anion gap: 11 (ref 5–15)
BUN: 31 mg/dL — ABNORMAL HIGH (ref 8–23)
CO2: 22 mmol/L (ref 22–32)
Calcium: 8.9 mg/dL (ref 8.9–10.3)
Chloride: 104 mmol/L (ref 98–111)
Creatinine, Ser: 1.83 mg/dL — ABNORMAL HIGH (ref 0.44–1.00)
GFR, Estimated: 26 mL/min — ABNORMAL LOW (ref >60)
Glucose, Bld: 82 mg/dL (ref 70–99)
Potassium: 4.1 mmol/L (ref 3.5–5.1)
Sodium: 137 mmol/L (ref 135–145)

## 2020-12-17 LAB — COMPREHENSIVE METABOLIC PANEL
ALT: 20 U/L (ref 0–44)
AST: 31 U/L (ref 15–41)
Albumin: 3.4 g/dL — ABNORMAL LOW (ref 3.5–5.0)
Alkaline Phosphatase: 57 U/L (ref 38–126)
Anion gap: 15 (ref 5–15)
BUN: 34 mg/dL — ABNORMAL HIGH (ref 8–23)
CO2: 18 mmol/L — ABNORMAL LOW (ref 22–32)
Calcium: 9.3 mg/dL (ref 8.9–10.3)
Chloride: 103 mmol/L (ref 98–111)
Creatinine, Ser: 2.29 mg/dL — ABNORMAL HIGH (ref 0.44–1.00)
GFR, Estimated: 20 mL/min — ABNORMAL LOW (ref >60)
Glucose, Bld: 110 mg/dL — ABNORMAL HIGH (ref 70–99)
Potassium: 4.3 mmol/L (ref 3.5–5.1)
Sodium: 136 mmol/L (ref 135–145)
Total Bilirubin: 0.4 mg/dL (ref 0.3–1.2)
Total Protein: 7.3 g/dL (ref 6.5–8.1)

## 2020-12-17 LAB — URINALYSIS, COMPLETE (UACMP) WITH MICROSCOPIC
Bilirubin Urine: NEGATIVE
Glucose, UA: NEGATIVE mg/dL
Ketones, ur: NEGATIVE mg/dL
Leukocytes,Ua: NEGATIVE
Nitrite: NEGATIVE
Protein, ur: NEGATIVE mg/dL
Specific Gravity, Urine: 1.015 (ref 1.005–1.030)
pH: 6 (ref 5.0–8.0)

## 2020-12-17 LAB — CBC
HCT: 29.6 % — ABNORMAL LOW (ref 36.0–46.0)
Hemoglobin: 9.3 g/dL — ABNORMAL LOW (ref 12.0–15.0)
MCH: 31.5 pg (ref 26.0–34.0)
MCHC: 31.4 g/dL (ref 30.0–36.0)
MCV: 100.3 fL — ABNORMAL HIGH (ref 80.0–100.0)
Platelets: 345 10*3/uL (ref 150–400)
RBC: 2.95 MIL/uL — ABNORMAL LOW (ref 3.87–5.11)
RDW: 14.6 % (ref 11.5–15.5)
WBC: 6.2 10*3/uL (ref 4.0–10.5)
nRBC: 0 % (ref 0.0–0.2)

## 2020-12-17 LAB — ABO/RH: ABO/RH(D): A POS

## 2020-12-17 LAB — TYPE AND SCREEN
ABO/RH(D): A POS
Antibody Screen: NEGATIVE

## 2020-12-17 LAB — HEMOGLOBIN AND HEMATOCRIT, BLOOD
HCT: 29.6 % — ABNORMAL LOW (ref 36.0–46.0)
Hemoglobin: 9.3 g/dL — ABNORMAL LOW (ref 12.0–15.0)

## 2020-12-17 LAB — PROTIME-INR
INR: 1.1 (ref 0.8–1.2)
Prothrombin Time: 13.9 seconds (ref 11.4–15.2)

## 2020-12-17 LAB — LACTIC ACID, PLASMA
Lactic Acid, Venous: 2.4 mmol/L (ref 0.5–1.9)
Lactic Acid, Venous: 1.2 mmol/L (ref 0.5–1.9)

## 2020-12-17 LAB — RESP PANEL BY RT-PCR (FLU A&B, COVID) ARPGX2
Influenza A by PCR: NEGATIVE
Influenza B by PCR: NEGATIVE
SARS Coronavirus 2 by RT PCR: NEGATIVE

## 2020-12-17 MED ORDER — GLYCOPYRROLATE 0.2 MG/ML IJ SOLN: 0.2000 mg | INTRAMUSCULAR | Status: DC | PRN

## 2020-12-17 MED ORDER — OXYCODONE HCL 20 MG/ML PO CONC: 5.0000 mg | ORAL | Status: DC | PRN

## 2020-12-17 MED ORDER — CEPHALEXIN 250 MG PO CAPS: 500.0000 mg | ORAL_CAPSULE | Freq: Two times a day (BID) | ORAL | Status: DC

## 2020-12-17 MED ORDER — HALOPERIDOL LACTATE 2 MG/ML PO CONC
0.5000 mg | ORAL | Status: DC | PRN
  Filled 2020-12-17: qty 0.3

## 2020-12-17 MED ORDER — AMLODIPINE BESYLATE 5 MG PO TABS
5.0000 mg | ORAL_TABLET | Freq: Every day | ORAL | Status: DC
  Administered 2020-12-17 – 2020-12-18 (×2): 5 mg via ORAL
  Filled 2020-12-17 (×2): qty 1

## 2020-12-17 MED ORDER — LACTATED RINGERS IV SOLN: INTRAVENOUS | Status: DC

## 2020-12-17 MED ORDER — ATORVASTATIN CALCIUM 40 MG PO TABS
40.0000 mg | ORAL_TABLET | Freq: Every day | ORAL | Status: DC
  Administered 2020-12-17: 40 mg via ORAL
  Filled 2020-12-17: qty 1

## 2020-12-17 MED ORDER — LORAZEPAM 2 MG/ML PO CONC: 1.0000 mg | ORAL | Status: DC | PRN

## 2020-12-17 MED ORDER — POLYVINYL ALCOHOL 1.4 % OP SOLN: 1.0000 [drp] | Freq: Four times a day (QID) | OPHTHALMIC | Status: DC | PRN

## 2020-12-17 MED ORDER — ACETAMINOPHEN 650 MG RE SUPP: 650.0000 mg | Freq: Four times a day (QID) | RECTAL | Status: DC | PRN

## 2020-12-17 MED ORDER — HALOPERIDOL 0.5 MG PO TABS
0.5000 mg | ORAL_TABLET | ORAL | Status: DC | PRN
  Filled 2020-12-17: qty 1

## 2020-12-17 MED ORDER — ZINC OXIDE 12.8 % EX OINT
TOPICAL_OINTMENT | Freq: Three times a day (TID) | CUTANEOUS | Status: DC
  Filled 2020-12-17 (×2): qty 56.7

## 2020-12-17 MED ORDER — ONDANSETRON HCL 4 MG/2ML IJ SOLN: 4.0000 mg | Freq: Four times a day (QID) | INTRAMUSCULAR | Status: DC | PRN

## 2020-12-17 MED ORDER — LORAZEPAM 2 MG/ML IJ SOLN: 1.0000 mg | INTRAMUSCULAR | Status: DC | PRN

## 2020-12-17 MED ORDER — IOHEXOL 9 MG/ML PO SOLN
ORAL | Status: AC
  Filled 2020-12-17: qty 1000

## 2020-12-17 MED ORDER — ONDANSETRON HCL 4 MG PO TABS: 4.0000 mg | ORAL_TABLET | Freq: Four times a day (QID) | ORAL | Status: DC | PRN

## 2020-12-17 MED ORDER — METOPROLOL SUCCINATE ER 50 MG PO TB24
50.0000 mg | ORAL_TABLET | Freq: Every day | ORAL | Status: DC
  Administered 2020-12-17 – 2020-12-18 (×2): 50 mg via ORAL
  Filled 2020-12-17: qty 1
  Filled 2020-12-17: qty 2

## 2020-12-17 MED ORDER — GLYCOPYRROLATE 1 MG PO TABS
1.0000 mg | ORAL_TABLET | ORAL | Status: DC | PRN
  Filled 2020-12-17: qty 1

## 2020-12-17 MED ORDER — ASPIRIN EC 81 MG PO TBEC
81.0000 mg | DELAYED_RELEASE_TABLET | Freq: Every day | ORAL | Status: DC
  Administered 2020-12-17 – 2020-12-18 (×2): 81 mg via ORAL
  Filled 2020-12-17 (×2): qty 1

## 2020-12-17 MED ORDER — LORAZEPAM 1 MG PO TABS: 1.0000 mg | ORAL_TABLET | ORAL | Status: DC | PRN

## 2020-12-17 MED ORDER — ACETAMINOPHEN 325 MG PO TABS: 650.0000 mg | ORAL_TABLET | Freq: Four times a day (QID) | ORAL | Status: DC | PRN

## 2020-12-17 MED ORDER — TICAGRELOR 90 MG PO TABS
90.0000 mg | ORAL_TABLET | Freq: Two times a day (BID) | ORAL | Status: DC
  Administered 2020-12-17 – 2020-12-18 (×3): 90 mg via ORAL
  Filled 2020-12-17 (×2): qty 1

## 2020-12-17 MED ORDER — HALOPERIDOL LACTATE 5 MG/ML IJ SOLN: 0.5000 mg | INTRAMUSCULAR | Status: DC | PRN

## 2020-12-17 MED ORDER — BIOTENE DRY MOUTH MT LIQD: 15.0000 mL | OROMUCOSAL | Status: DC | PRN

## 2020-12-17 MED ORDER — LEVOTHYROXINE SODIUM 50 MCG PO TABS
50.0000 ug | ORAL_TABLET | Freq: Every day | ORAL | Status: DC
  Administered 2020-12-17: 50 ug via ORAL
  Filled 2020-12-17: qty 2

## 2020-12-17 NOTE — ED Notes (Signed)
Unit  [facility] CN will be taking this pt.  She is doing pt care and will call me back asap.

## 2020-12-17 NOTE — ED Notes (Signed)
Pt niece, [name] (POA, number in chart and at bedside) would like to be called with updates.

## 2020-12-17 NOTE — H&P (Signed)
History and Physical    AVANTIKA SHERE ZOX:[id number] DOB: 1930-09-12 DOA: 12/16/2020  PCP: Georgianne Fick, MD  Patient coming from: ALF  I have personally briefly reviewed patient's old medical records in [facility]  Chief Complaint: BRBPR  HPI: Maria Dyer is a 87 y.o. female with medical history significant of HTN.  Pt had L MCA territory stroke at end of Aug.  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  To ALF after rehab.  Not doing well in ALF per daughter.  Pt very fearful and doesn't ambulate much.  Having urinary incontinence.  Pt presents to ED today for blood noted in diaper tonight.   ED Course: HGB 10.8, Creat 2.29 up from 1.0 baseline.  BUN 34.  Bp 145/87  HR 87  Hemoccult neg  1 bloody BM in diaper on arrival, no further bloody BMs throughout the night.  Denies pain anywhere.  Pt not really able to contribute much more to history taking due to severe "fluent aphasia".   Review of Systems: As per HPI, otherwise all review of systems negative.  Past Medical History:  Diagnosis Date   Hypertension     Past Surgical History:  Procedure Laterality Date   IR CT HEAD LTD  11/19/2020   IR INTRA CRAN STENT  11/19/2020   IR PERCUTANEOUS ART THROMBECTOMY/INFUSION INTRACRANIAL INC DIAG ANGIO  11/19/2020   IR US GUIDE VASC ACCESS LEFT  11/19/2020   RADIOLOGY WITH ANESTHESIA N/A 11/19/2020   Procedure: IR WITH ANESTHESIA;  Surgeon: Radiologist, Medication, MD;  Location: [facility] OR;  Service: Radiology;  Laterality: N/A;     reports that she has never smoked. She has never used smokeless tobacco. She reports that she does not currently use alcohol. She reports that she does not currently use drugs.  Allergies  Allergen Reactions   Enalapril     Other reaction(s): diarrhea   Fosinopril     Other reaction(s): diarrhea   Lisinopril     Other reaction(s): diarrhea    Family History  Family  history unknown: Yes   Pt unable to provide family history secondary to severe aphasia  Prior to Admission medications   Medication Sig Start Date End Date Taking? Authorizing Provider  amLODipine (NORVASC) 5 MG tablet Take 1 tablet (5 mg total) by mouth daily. 12/11/20  Yes Angiulli, Mcarthur Rossetti, PA-C  aspirin EC 81 MG tablet Take 81 mg by mouth daily. Swallow whole.   Yes [provider]  atorvastatin (LIPITOR) 40 MG tablet Take 1 tablet (40 mg total) by mouth daily. 12/11/20  Yes Angiulli, Mcarthur Rossetti, PA-C  cephALEXin (KEFLEX) 500 MG capsule Take 500 mg by mouth 2 (two) times daily. Continuous 12/12/20  Yes [provider]  fluconazole (DIFLUCAN) 150 MG tablet Take 1 tablet (150 mg total) by mouth once a week. 12/17/20  Yes Angiulli, Mcarthur Rossetti, PA-C  GOODSENSE ARTHRITIS PAIN 650 MG CR tablet Take 650 mg by mouth every 4 (four) hours as needed. 12/12/20  Yes [provider]  levothyroxine (SYNTHROID) 50 MCG tablet Take 1 tablet (50 mcg total) by mouth daily. 12/11/20  Yes Angiulli, Mcarthur Rossetti, PA-C  losartan (COZAAR) 100 MG tablet Take 1 tablet (100 mg total) by mouth daily. 12/11/20  Yes Angiulli, Mcarthur Rossetti, PA-C  metoprolol succinate (TOPROL-XL) 50 MG 24 hr tablet Take 1 tablet (50 mg total) by mouth daily. Take with or immediately following a meal. 12/11/20  Yes Angiulli, Mcarthur Rossetti, PA-C  modafinil (PROVIGIL) 100 MG tablet Take 100 mg by mouth daily.   Yes [provider]  ticagrelor (BRILINTA) 90 MG TABS tablet Take 1 tablet (90 mg total) by mouth 2 (two) times daily. 12/11/20  Yes Angiulli, Mcarthur Rossetti, PA-C  zinc oxide 20 % ointment Apply 1 application topically See admin instructions. 3 times a week as needed for irritation   Yes [provider]  magic mouthwash SOLN Take 5 mLs by mouth See admin instructions. Swish and spit 5 mls 4 times daily x 10 days    [provider]    Physical Exam: Vitals:   12/17/20 0315 12/17/20 0330 12/17/20 0400 12/17/20  0445  BP: (!) 148/40  (!) 156/80 (!) 145/87  Pulse: 60 74 65 87  Resp: (!) 22 17 19  (!) 29  Temp:      TempSrc:      SpO2: 100% 100% 100% 99%  Weight:      Height:        Constitutional: NAD, calm, comfortable Eyes: PERRL, lids and conjunctivae normal ENMT: Mucous membranes are moist. Posterior pharynx clear of any exudate or lesions.Normal dentition.  Neck: normal, supple, no masses, no thyromegaly Respiratory: clear to auscultation bilaterally, no wheezing, no crackles. Normal respiratory effort. No accessory muscle use.  Cardiovascular: Regular rate and rhythm, no murmurs / rubs / gallops. No extremity edema. 2+ pedal pulses. No carotid bruits.  Abdomen: no tenderness, no masses palpated. No hepatosplenomegaly. Bowel sounds positive.  Musculoskeletal: no clubbing / cyanosis. No joint deformity upper and lower extremities. Good ROM, no contractures. Normal muscle tone.  Skin: B stage 1 decubiti of buttocks Neurologic: Fairly severe aphasia, nonsensical words / phrases / answers. Psychiatric: Unable to assess   Labs on Admission: I have personally reviewed following labs and imaging studies  CBC: Recent Labs  Lab 12/16/20 2324 12/16/20 2339 12/17/20 0415  WBC 8.1  --  6.2  NEUTROABS 5.9  --   --   HGB 10.8* 11.2* 9.3*  HCT 34.1* 33.0* 29.6*  MCV 100.6*  --  100.3*  PLT 448*  --  345   Basic Metabolic Panel: Recent Labs  Lab 12/16/20 2324 12/16/20 2339 12/17/20 0415  NA 136 137 137  K 4.3 4.3 4.1  CL 103 106 104  CO2 18*  --  22  GLUCOSE 110* 108* 82  BUN 34* 38* 31*  CREATININE 2.29* 2.20* 1.83*  CALCIUM 9.3  --  8.9   GFR: Estimated Creatinine Clearance: 17.7 mL/min (A) (by C-G formula based on SCr of 1.83 mg/dL (H)). Liver Function Tests: Recent Labs  Lab 12/16/20 2324  AST 31  ALT 20  ALKPHOS 57  BILITOT 0.4  PROT 7.3  ALBUMIN 3.4*   No results for input(s): LIPASE, AMYLASE in the last 168 hours. No results for input(s): AMMONIA in the last 168  hours. Coagulation Profile: Recent Labs  Lab 12/16/20 2324  INR 1.1   Cardiac Enzymes: No results for input(s): CKTOTAL, CKMB, CKMBINDEX, TROPONINI in the last 168 hours. BNP (last 3 results) No results for input(s): PROBNP in the last 8760 hours. HbA1C: No results for input(s): HGBA1C in the last 72 hours. CBG: No results for input(s): GLUCAP in the last 168 hours. Lipid Profile: No results for input(s): CHOL, HDL, LDLCALC, TRIG, CHOLHDL, LDLDIRECT in the last 72 hours. Thyroid Function Tests: No results for input(s): TSH, T4TOTAL, FREET4, T3FREE, THYROIDAB in the last 72 hours. Anemia Panel: No results for input(s): VITAMINB12, FOLATE, FERRITIN, TIBC, IRON, RETICCTPCT  in the last 72 hours. Urine analysis:    Component Value Date/Time   COLORURINE YELLOW 12/07/2020 0909   APPEARANCEUR CLEAR 12/07/2020 0909   LABSPEC 1.010 12/07/2020 0909   PHURINE 6.0 12/07/2020 0909   GLUCOSEU NEGATIVE 12/07/2020 0909   HGBUR SMALL (A) 12/07/2020 0909   BILIRUBINUR NEGATIVE 12/07/2020 0909   KETONESUR 15 (A) 12/07/2020 0909   PROTEINUR NEGATIVE 12/07/2020 0909   NITRITE NEGATIVE 12/07/2020 0909   LEUKOCYTESUR MODERATE (A) 12/07/2020 0909    Radiological Exams on Admission: CT ABDOMEN PELVIS WO CONTRAST  Result Date: 12/17/2020 CLINICAL DATA:  87 year old with abdominal pain today. Diverticulitis suspected. EXAM: CT ABDOMEN AND PELVIS WITHOUT CONTRAST TECHNIQUE: Multidetector CT imaging of the abdomen and pelvis was performed following the standard protocol without IV contrast. COMPARISON:  None. FINDINGS: Lower chest: Breathing motion artifact. Suspected emphysema. The heart is normal in size. Dense mitral annulus calcifications. Coronary artery calcifications. Hepatobiliary: Motion artifact through the upper abdomen. No evidence of focal hepatic abnormality. Curvilinear calcification in the region of the gallbladder fundus may be gallstone or wall calcification. There is no abnormal  gallbladder distension. Common bile duct measures 10 mm, mildly dilated but may be normal for age. No visualized choledocholithiasis. Pancreas: Parenchymal atrophy. No ductal dilatation or inflammation. Spleen: Normal in size without focal abnormality. Adrenals/Urinary Tract: Normal adrenal glands. No hydronephrosis or perinephric edema. Renal calculi. No evidence of focal renal abnormality on this unenhanced exam. Decompressed ureters. Unremarkable urinary bladder. Stomach/Bowel: The stomach is nondistended. No small bowel obstruction or inflammatory change. Appendix is tentatively visualized and normal. There is no appendicitis. Small to moderate volume of stool throughout the colon. No diverticulitis or colonic inflammation. Vascular/Lymphatic: Moderate aortic and branch atherosclerosis. No aortic aneurysm. There is no portal venous or mesenteric gas. No perivascular stranding in the left groin at site of recent vascular access. Small retroperitoneal lymph nodes are not enlarged by size criteria. No bulky abdominopelvic adenopathy Reproductive: Normal for age uterine atrophy. Quiescent ovaries. No adnexal mass. Other: No free air, free fluid, or intra-abdominal fluid collection. No abdominal wall hernia. Musculoskeletal: Scoliosis and degenerative change in the spine. Degenerative change in both hips. There are no acute or suspicious osseous abnormalities. IMPRESSION: 1. No acute abnormality or explanation for abdominal pain. 2. Curvilinear calcification in the region of the gallbladder fundus may be gallstone or wall calcification. No abnormal gallbladder distension. Aortic Atherosclerosis (ICD10-I70.0). Electronically Signed   By: Narda Rutherford M.D.   On: 12/17/2020 01:00    EKG: Independently reviewed.  Assessment/Plan Principal Problem:   BRBPR (bright red blood per rectum) Active Problems:   Left middle cerebral artery stroke (HCC)   AKI (acute kidney injury) (HCC)    BRBPR - With mild  ABLA H/H Q6H Tele monitor Type and screen Message sent to GI for AM consult See below discussion about anti-platelet agents L MCA stroke s/p stent - Very recent stent, think theres pretty high risk of ISR / thrombosis if we stop ASA + brilinta at this time. In contrast, GIB seems mild-moderate at the moment.  1 bloody BM, no further bloody BMs overnight since arrival to ED (3pm yesterday) so over 12h now. Therefore will order ASA + Brilinta to continue this morning, unless GI feels otherwise. Pt does have fairly significant aphasia still. Cont statin AKI - Suspect dehydration as cause IVF: 500cc bolus then 125cc/hr LR Strict intake and output Daily BMP HTN - Hold losartan Continue norvasc Cont metoprolol  DVT prophylaxis: SCDs Code Status: Full Family Communication:  No family in room Disposition Plan: ALF vs more likely SNF after admit (doesn't sound like shes doing well in ALF) Consults called: Message sent to Dr. Rhea Belton Admission status: Place in obs    Jamar Weatherall M. DO [facility]  How to contact the [facility] Attending or Consulting provider 7A - 7P or covering provider during after hours 7P -7A, for this patient?  Check the care team in [facility] and look for a) attending/consulting [facility] provider listed and b) the [facility] team listed Log into [email or web address]  Amion Physician Scheduling and messaging for groups and whole hospitals  On call and physician scheduling software for group practices, residents, hospitalists and other medical providers for call, clinic, rotation and shift schedules. On[employer] is a hospital-wide system for scheduling doctors and paging doctors on call. EasyPlot is for scientific plotting and data analysis.  [email or web address]  and use [place]'s universal password to access. If you do not have the password, please contact the hospital operator.  Locate the [facility] provider you are looking for under [facility] and page to a number that you can be  directly reached. If you still have difficulty reaching the provider, please page the [facility] (Director on Call) for the Hospitalists listed on amion for assistance.  12/17/2020, 5:18 AM

## 2020-12-17 NOTE — Progress Notes (Addendum)
[facility] Short progress note   This is an 87 year old female who presents from [facility] assisted living with 1 episode of blood noted in her diaper.  Her medical history includes a CVA in August, 2022 which required mechanical thrombectomy and intracranial stenting and she is currently receiving aspirin and Brilinta.  The patient was discharged to [facility] inpatient rehab after the infarct and subsequently to assisted living.  Of note, the patient has not been ambulating much at her facility due to fear of falling. Today I have spoken with the patient's niece Jeannett Senior who states that her mother (the patient's sister, Vic Blackbird is the POA but she is out of town and is returning today).   On exam today, she is severely confused and able to only tell me her name.  Vitals:   12/17/20 0530 12/17/20 0615 12/17/20 0700 12/17/20 0730  BP: (!) 116/42 125/62 (!) 133/41   Pulse: (!) 59 69 (!) 59   Temp: 98.6 F (37 C)   98.8 F (37.1 C)  Resp: 19 (!) 22 17   Height:      Weight:      SpO2: 100% 100% 100%   TempSrc: Oral   Oral  BMI (Calculated):         Principal Problem:   BRBPR (bright red blood per rectum) - no colonoscopy on record -  - abdomen non-tender on exam- differential includes diverticulosis, AVM, hemorrhoidal (internal hemorrhoids), ulcer or mass (less likely) - Difficult situation as the patient has a recent stent to the left M2 branch of the MCA - Continuing Brilinta and ASA for now - Following hemoglobin and monitoring for more bleeding - GI consulted - no further bleeding noted in the hospital thus far  Active Problems:   Left middle cerebral artery stroke  -In 10/2020 - MRI revealed left MCA patchy infarcts and was thought to be related to an embolic stroke-the patient underwent a thrombectomy of L M2 near occlusion and subsequently stenting due to persistent stenosis - A loop recorder was not placed at that time-she was discharged on aspirin and Brilinta  with a plan to follow-up as outpatient to determine the duration of anticoagulation - I have notified Dr Roda Shutters of the stroke team of this patient's admission - he states that he will touch base with IR, Dr Sherlon Handing to decide on the the antiplatelet agents - addendum: spoke with Dr Roda Shutters- plan to order P2Y12- if level is low, he can lower the dose of Brillinta    ABLA (acute blood loss anemia) - Hb ranges from 10-11 at baseline - Hb now ~ 9 - continue to follow closely  Lactic acidosis - Lactic acid 2.4- will recheck   AKI (acute kidney injury) (HCC) with metabolic acidosis -Felt to be secondary to poor oral intake and partly due to acute blood loss - according to the patient's niece, she has not been drinking and eating well since she was transitioned from CIR to ALF- she will eat when family is present but otherwise eats poorly- the family has been considering palliative discussions - Baseline creatinine is around 0.9  Confusion - Dementia is not listed on the chart but according to her niece, she has been confused since her CVA and this has been progressive - at this time, she is confused to time and place but not agitated  Severe hearing loss - tends to understand better when people communicate through writing  Disposition: f/u on neurology and GI recommendations regarding anticoagulation.  Consulting palliative care. Per the patient's niece, the patient would not want a feeding tube or aggressive interventions.  Note: POA is Wendie [phone]  Calvert Cantor, MD

## 2020-12-17 NOTE — Consult Note (Addendum)
[facility] Consult: 8:24 AM 12/17/2020  LOS: 0 days    Referring Provider: Dr. Butler Denmark Primary Care Physician:  Georgianne Fick, MD Primary Gastroenterologist:  unassigned.   HCPOA is Charlynne Pander, niece.  [phone] cell    Reason for Consultation:  hematochezia.     HPI: Maria Dyer is a 87 y.o. female.  PMH Htn.  CVA 10/2020, s/p thrombectomy, intracranial stenting.  Started on Brilinta, aspirin.  Residual severe aphasia.  Glucose intolerance.  Hypothyroidism.  No records of previous EGD or colonoscopy in epic and niece confirms pt has never seen a GI doctor or had colonoscopy/EGD.    Acute admission 8/23 - 8/29.  [facility] inpt rehab admission 8/29 - 9/15.  CKD stage 3a during admit. Also grew aerococcus Urinae from urine.    Transported from assisted living late yesterday evening after an episode of bloody stool.  No mention of abdominal pain, nausea, vomiting in chart.  Not clear how well she has been eating.  Recent bowel habits unknown.  May have had a smear of blood since arrival in the ED but no further significant hematochezia.  Vital signs stable at end since arrival.  Hgb 10.8 >> 9.3.  MCV 100.  Platelets, INR normal.  During hospitalizations 3 to 4 weeks ago Hgb range 10.5 (at admission) to 12.8 (apex) to 9.5 on 9/12. GFR 20 >> 26 since last night, was in high 50s during recent admission.   FOBT +.   Non con CTAP: No explanation for abdominal pain.  Calcification in region of GB fundus could be gallstone or wall calcification.  No GB distention.  Pancreatic atrophy.  CBD 10 mm, mildly dilated but possibly normal for age.  Unable to obtain family or social history.    Past Medical History:  Diagnosis Date  . Hypertension     Past Surgical History:  Procedure Laterality Date  . IR CT  HEAD LTD  11/19/2020  . IR INTRA CRAN STENT  11/19/2020  . IR PERCUTANEOUS ART THROMBECTOMY/INFUSION INTRACRANIAL INC DIAG ANGIO  11/19/2020  . IR US GUIDE VASC ACCESS LEFT  11/19/2020  . RADIOLOGY WITH ANESTHESIA N/A 11/19/2020   Procedure: IR WITH ANESTHESIA;  Surgeon: Radiologist, Medication, MD;  Location: [facility] OR;  Service: Radiology;  Laterality: N/A;    Prior to Admission medications   Medication Sig Start Date End Date Taking? Authorizing Provider  amLODipine (NORVASC) 5 MG tablet Take 1 tablet (5 mg total) by mouth daily. 12/11/20  Yes Angiulli, Mcarthur Rossetti, PA-C  aspirin EC 81 MG tablet Take 81 mg by mouth daily. Swallow whole.   Yes [provider]  atorvastatin (LIPITOR) 40 MG tablet Take 1 tablet (40 mg total) by mouth daily. 12/11/20  Yes Angiulli, Mcarthur Rossetti, PA-C  cephALEXin (KEFLEX) 500 MG capsule Take 500 mg by mouth 2 (two) times daily. Continuous 12/12/20  Yes [provider]  fluconazole (DIFLUCAN) 150 MG tablet Take 1 tablet (150 mg total) by mouth once a week. 12/17/20  Yes Angiulli, Mcarthur Rossetti, PA-C  GOODSENSE ARTHRITIS PAIN 650 MG  CR tablet Take 650 mg by mouth every 4 (four) hours as needed. 12/12/20  Yes [provider]  levothyroxine (SYNTHROID) 50 MCG tablet Take 1 tablet (50 mcg total) by mouth daily. 12/11/20  Yes Angiulli, Mcarthur Rossetti, PA-C  losartan (COZAAR) 100 MG tablet Take 1 tablet (100 mg total) by mouth daily. 12/11/20  Yes Angiulli, Mcarthur Rossetti, PA-C  metoprolol succinate (TOPROL-XL) 50 MG 24 hr tablet Take 1 tablet (50 mg total) by mouth daily. Take with or immediately following a meal. 12/11/20  Yes Angiulli, Mcarthur Rossetti, PA-C  modafinil (PROVIGIL) 100 MG tablet Take 100 mg by mouth daily.   Yes [provider]  ticagrelor (BRILINTA) 90 MG TABS tablet Take 1 tablet (90 mg total) by mouth 2 (two) times daily. 12/11/20  Yes Angiulli, Mcarthur Rossetti, PA-C  zinc oxide 20 % ointment Apply 1 application topically See admin instructions. 3 times a week as needed  for irritation   Yes [provider]  magic mouthwash SOLN Take 5 mLs by mouth See admin instructions. Swish and spit 5 mls 4 times daily x 10 days    [provider]    Scheduled Meds: . amLODipine  5 mg Oral Daily  . aspirin EC  81 mg Oral Daily  . atorvastatin  40 mg Oral Daily  . cephALEXin  500 mg Oral BID  . levothyroxine  50 mcg Oral Daily  . metoprolol succinate  50 mg Oral Daily  . ticagrelor  90 mg Oral BID  . Zinc Oxide   Topical TID   Infusions: . lactated ringers 125 mL/hr at 12/17/20 0554   PRN Meds: acetaminophen **OR** acetaminophen, ondansetron **OR** ondansetron (ZOFRAN) IV   Allergies as of 12/16/2020 - Review Complete 11/25/2020  Allergen Reaction Noted  . Enalapril  12/29/2019  . Fosinopril  12/29/2019  . Lisinopril  12/29/2019    Family History  Family history unknown: Yes    Social History   Socioeconomic History  . Marital status: Single  Recent resident of [facility] assisted living.  Was functional and living on her own prior to stroke.  Involved family members with niece and great niece.   REVIEW OF SYSTEMS: All of this was gleaned from review of chart or talking to the nurse today. Constitutional: RN at bedside tells me that patient was walking with 2 person assist at ALF but that she is very fearful of falling but no recent reports of falling. ENT:  No nose bleeds Pulm: No reports of shortness of breath CV:  No palpitations, no LE edema.  GU: Incontinent of urine. GI: See HPI. Heme: Multiple bruises especially on her legs Transfusions: No history of transfusions found in epic Neuro:  No headaches, no peripheral tingling or numbness.  No reports of syncope, dizziness, seizures.. Derm:  No itching, no rash or sores.  Endocrine:  No sweats or chills.  No polyuria or dysuria Immunization: Reviewed. Travel:  None beyond local counties in last few months.    PHYSICAL EXAM: Vital signs in last 24 hours: Vitals:    12/17/20 0700 12/17/20 0730  BP: (!) 133/41   Pulse: (!) 59   Resp: 17   Temp:  98.8 F (37.1 C)  SpO2: 100%    Wt Readings from Last 3 Encounters:  12/16/20 63.5 kg  12/08/20 60.6 kg  11/19/20 63.8 kg    General: Patient has significant expressive aphasia.  Not currently following commands.  Resting comfortably but becomes agitated with exam. Head: No facial swelling  or signs of head trauma. Eyes: Arcus senilis.  No conjunctival pallor. Ears: Unable to assess hearing Nose: No discharge or congestion Mouth: Fair dentition.  Mucosa pink, moist, clear.  Scabbed, dime sized sore in region of left mouth Neck: No masses, no thyromegaly, no JVD Lungs: No labored breathing or cough.  Lungs clear though breath sounds globally diminished in setting of shallow inspiratory effort. Heart: RRR.  S1, S2 present.  No MRG. Abdomen: Soft.  No HSM, masses, bruits, hernias.  No obvious tenderness.  Bowel sounds active.  No distention..   Rectal: Small spot of red blood on depends.  Erythema and somewhat macerated skin in the general perirectal area.  No visible or palpable masses or hemorrhoids.  Exam glove with scant amount of red blood. Musc/Skeltl: No joint redness, swelling or gross deformity. Extremities: No CCE. Neurologic: At present time not following commands, not opening eyes.  No tremors.  Did not test limb movement or strength. Skin: Dozens of bruises on the legs bilaterally, predominantly in the lower legs.  Some bruises in the area of the posterior trunk.  No telangiectasia.  Scabbed over sore in region of her left lip.  Intertriginous rash in her thighs, similar appearance to what is seen around the perirectal area. Tattoos: None observed Nodes: No cervical adenopathy Psych: Gets agitated with exam.   LAB RESULTS: Recent Labs    12/16/20 2324 12/16/20 2339 12/17/20 0415  WBC 8.1  --  6.2  HGB 10.8* 11.2* 9.3*  HCT 34.1* 33.0* 29.6*  PLT 448*  --  345   BMET Lab Results   Component Value Date   NA 137 12/17/2020   NA 137 12/16/2020   NA 136 12/16/2020   K 4.1 12/17/2020   K 4.3 12/16/2020   K 4.3 12/16/2020   CL 104 12/17/2020   CL 106 12/16/2020   CL 103 12/16/2020   CO2 22 12/17/2020   CO2 18 (L) 12/16/2020   CO2 22 12/09/2020   GLUCOSE 82 12/17/2020   GLUCOSE 108 (H) 12/16/2020   GLUCOSE 110 (H) 12/16/2020   BUN 31 (H) 12/17/2020   BUN 38 (H) 12/16/2020   BUN 34 (H) 12/16/2020   CREATININE 1.83 (H) 12/17/2020   CREATININE 2.20 (H) 12/16/2020   CREATININE 2.29 (H) 12/16/2020   CALCIUM 8.9 12/17/2020   CALCIUM 9.3 12/16/2020   CALCIUM 8.5 (L) 12/09/2020   LFT Recent Labs    12/16/20 2324  PROT 7.3  ALBUMIN 3.4*  AST 31  ALT 20  ALKPHOS 57  BILITOT 0.4   PT/INR Lab Results  Component Value Date   INR 1.1 12/16/2020   INR 1.1 11/19/2020     RADIOLOGY STUDIES: CT ABDOMEN PELVIS WO CONTRAST  Result Date: 12/17/2020 CLINICAL DATA:  87 year old with abdominal pain today. Diverticulitis suspected. EXAM: CT ABDOMEN AND PELVIS WITHOUT CONTRAST TECHNIQUE: Multidetector CT imaging of the abdomen and pelvis was performed following the standard protocol without IV contrast. COMPARISON:  None. FINDINGS: Lower chest: Breathing motion artifact. Suspected emphysema. The heart is normal in size. Dense mitral annulus calcifications. Coronary artery calcifications. Hepatobiliary: Motion artifact through the upper abdomen. No evidence of focal hepatic abnormality. Curvilinear calcification in the region of the gallbladder fundus may be gallstone or wall calcification. There is no abnormal gallbladder distension. Common bile duct measures 10 mm, mildly dilated but may be normal for age. No visualized choledocholithiasis. Pancreas: Parenchymal atrophy. No ductal dilatation or inflammation. Spleen: Normal in size without focal abnormality. Adrenals/Urinary Tract: Normal adrenal glands.  No hydronephrosis or perinephric edema. Renal calculi. No evidence of  focal renal abnormality on this unenhanced exam. Decompressed ureters. Unremarkable urinary bladder. Stomach/Bowel: The stomach is nondistended. No small bowel obstruction or inflammatory change. Appendix is tentatively visualized and normal. There is no appendicitis. Small to moderate volume of stool throughout the colon. No diverticulitis or colonic inflammation. Vascular/Lymphatic: Moderate aortic and branch atherosclerosis. No aortic aneurysm. There is no portal venous or mesenteric gas. No perivascular stranding in the left groin at site of recent vascular access. Small retroperitoneal lymph nodes are not enlarged by size criteria. No bulky abdominopelvic adenopathy Reproductive: Normal for age uterine atrophy. Quiescent ovaries. No adnexal mass. Other: No free air, free fluid, or intra-abdominal fluid collection. No abdominal wall hernia. Musculoskeletal: Scoliosis and degenerative change in the spine. Degenerative change in both hips. There are no acute or suspicious osseous abnormalities. IMPRESSION: 1. No acute abnormality or explanation for abdominal pain. 2. Curvilinear calcification in the region of the gallbladder fundus may be gallstone or wall calcification. No abnormal gallbladder distension. Aortic Atherosclerosis (ICD10-I70.0). Electronically Signed   By: Narda Rutherford M.D.   On: 12/17/2020 01:00      IMPRESSION:     Hematochezia.  Diverticulosis, colitis, source for bleeding not seen on overnight noncontrasted CT scan.    [place] anemia.    AKI    Recent left MCA territory stroke.  Thrombectomy and intracranial stenting 8/23.  Residual severe aphasia.  Chronic Brilinta, aspirin since stenting.  Not on hold.    Multiple bruises especially on legs, some on trunk.  history of recent falls not reported.  Marland Kitchen    PLAN:      Per Dr Leone Payor.  ? Colonoscopy vs flex sig vs watch and wait.       Jennye Moccasin  12/17/2020, 8:24 AM Phone [phone]    [facility] Attending   I have  taken an interval history, reviewed the chart and examined the patient. I agree with the Advanced Practitioner's note, impression and recommendations.  Majority the medical decision-making in the formulation of the assessment and plan were performed by me.  I had a conversation with the patient's great niece who is filling in for the healthcare power of attorney right now while she is away.  This is Jeannett Senior.  She is of the mind to observe and support at this time and believes they will be talking to palliative care about goals of care.  She had a conversation with Dr. Butler Denmark about this this morning and I concur that that is appropriate.  This poor lady was very functional up until about a month ago and now things are rapidly deteriorating and I do not think we can really reverse this trajectory.  I did look at the CT scan and I think there is diverticulosis there and that is likely what she is bleeding from the of course we do not know.  At any rate she is not "bleeding out" and we have time to sort through this.  Should the need arise for endoscopic evaluation though I think the utility of that is incredibly low given the overall situation, we will revisit that.   We will follow-up tomorrow with at least a chart check.  I think hospice makes sense for this patient.   Iva Boop, MD, [facility] 12/17/2020 10:39 AM

## 2020-12-17 NOTE — ED Notes (Signed)
Removed pt from purewick d/t being soiled with stool.  Brief was full of soft brown stool.  Only blood noted appeared as if it could be coming from the significant skin breakdown observed while changing her, bother in her perineal area and between her buttocks up to her sacrum.   Very red and tender.   Pt was transported immediately after cleaning. Did not place fresh purewick or brief prior to transport.

## 2020-12-17 NOTE — Consult Note (Addendum)
Consultation Note Date: 12/17/2020   Patient Name: Maria Dyer  DOB: Sep 17, 1930  MRN: [id number]  Age / Sex: 87 y.o., female  PCP: Merrilee Seashore, MD Referring Physician: Debbe Odea, MD  Reason for Consultation: Establishing goals of care, "poor oral intake and failure to thrive"  HPI/Patient Profile: 87 y.o. female  with past medical history of hypertension, severely hearing impaired, breast cancer status post mastectomy 25 years ago, hypothyroidism, and recent CVA August 2022 where she underwent mechanical thrombectomy and intracranial stent presented to the ED on 12/16/20 from [facility] with GIB. Patient was admitted on 12/16/2020 with BRBPR and AKI. 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   Of note, patient was recently hospitalized from 8/23-8/29/22 with left middle cerebral artery stroke. She discharged to acute inpatient rehab and was there from 8/23-8/29/22. After, she transitioned to the ALF where she currently resides.    Clinical Assessment and Goals of Care: I have reviewed medical records including EPIC notes, labs, and imaging. Received report from primary RN - no acute concerns. RN reports patient has been sleeping most of the day, not talking, not interested in eating/drinking.   Went to visit patient at bedside - no family/visitors present. Patient was lying in bed asleep - she does wake to voice/gentle touch. She is disoriented x4 and not able to participate in conversation or make complex medical decisions. No signs or non-verbal gestures of pain or discomfort noted. No respiratory distress, increased work of breathing, or secretions noted. When asked questions, she states "okay."  4:00 PM Met with niece/HCPOA/Nancy Eiklor and her husband/Jim via speakerphone  to discuss diagnosis, prognosis, GOC, EOL wishes, disposition, and options.  I introduced Palliative Medicine as  specialized medical care for people living with serious illness. It focuses on providing relief from the symptoms and stress of a serious illness. The goal is to improve quality of life for both the patient and the family.  Izora Gala would like to include patient's other niece/Danielle in [place] conversation. Andee Poles is free after 5:00PM - will call family back at 5:15p for conference call.    5:15 PM - 6:30 PM phone call with family: I called Brita Romp, and Danielle via conference call to discuss diagnosis, prognosis, GOC, EOL wishes, disposition, and options.  I introduced Palliative Medicine as specialized medical care for people living with serious illness. It focuses on providing relief from the symptoms and stress of a serious illness. The goal is to improve quality of life for both the patient and the family.  We discussed a brief life review of the patient as well as functional and nutritional status.  The patient was described as someone who "lives a quiet life," loves to read, and eat ice cream.  Patient never married or had any children. Her closest relatives include her nieces and Clair Gulling. Prior to current hospitalization, patient was living at [facility] assisted living facility -she moved in last Thursday.  Family tell me that prior to 4 weeks ago/before patient's stroke, patient was living independently  at home, was paying her own bills, could dress and bathe herself, and was able to ambulate short distances with a walker at home; however, after the stroke family noted seeing a drastic decline in patient's physical and mental status.  After her stroke, patient became "terrified" that she was going to fall and stopped walking and has since become incontinent.  Patient now needs assistance with all ADLs and family state that patient has had a poor appetite and not eating/drinking enough to sustain her long term.  Since patient's stroke family also note that she has had a harder time communicating due  to her aphasia; this makes it especially difficult since she is also hard of hearing.  We discussed patient's current illness and what it means in the larger context of patient's on-going co-morbidities.  Family understands that CKD is a progressive, non-curable disease underlying the patient's current acute medical conditions.  Family also have a clear understanding of patient's acute medical situation. Natural disease trajectory and expectations at EOL were discussed. I attempted to elicit values and goals of care important to the patient. The difference between aggressive medical intervention and comfort care was considered in light of the patient's goals of care.  Reviewed that, unfortunately, patient's rapid deterioration is likely not a reversible trajectory. Family are clear that the patient "would not want to live like this."  Family do not want to prolong the patient's suffering and their primary goal is to focus on her quality of life and comfort.  Family are not interested in invasive procedures such as endoscopy nor would they want to pursue feeding tube or hemodialysis if offered.  Family tell me that patient does not like hospitals and is very clear that they would not want patient rehospitalized after discharge.   Provided education and counseling at length on the philosophy and benefits of hospice care. Discussed that it offers a holistic approach to care in the setting of end-stage illness, and is about supporting the patient where they are allowing nature to take it's course. Discussed the hospice team includes RNs, physicians, social workers, and chaplains. They can provide personal care, support for the family, and help keep patient out of the hospital as well as assist with DME needs for home hospice. Education provided on the difference between home vs residential hospice.  Prognostication was reviewed -patient would not be eligible for residential hospice at this time.  Family express  great interest in enrolling the patient in hospice services as this aligns with their goals to prevent rehospitalization and focus on quality of life.  Family would prefer patient discharge back to [facility] with hospice services.  We discussed assisted living facility versus long-term care and that [facility] may not accept or may recommend increasing patient's level of care once discharged -family expressed understanding.  Family have had conversations with [facility] in the past about needing long-term care and family feel that [facility] could provide per their previous conversations.  Discussed the difference between palliative care and hospice care per family's request. Palliative care and hospice have similar goals of managing symptoms, promoting comfort, improving quality of life, and maintaining a person's dignity. However, palliative care may be offered during any phase of a serious illness, while hospice care is usually offered when a person is expected to live for 6 months or less.  Family still feel hospice care aligns best with her goals.  Family had questions around when patient could be discharged.  Reviewed that we  could discharge patient as early as tomorrow but would need to ensure [facility] could accept patient back into their care as well as ensure hospice services were set up before patient's discharge.  Family are hopeful that patient can discharge tomorrow or as soon as possible as they reiterate again that patient does not like hospitals and would prefer to be in a familiar environment.  Briefly reviewed financial implications around initiating hospice while patient is at assisted living facility.  Currently, patient is self-pay for [facility] which would allow her Medicare benefit to be utilized for hospice.  We talked about transition to comfort measures in house and what that would entail inclusive of medications to control pain, dyspnea, agitation, nausea, itching,  and hiccups. We discussed stopping all unnecessary measures such as blood draws, needle sticks, oxygen, antibiotics, CBGs/insulin, cardiac monitoring, IVF, and frequent vital signs.  Family are agreeable for transition to full comfort measures today while waiting for discharge home with hospice.  Family would like to speak with patient's medical specialists as well as hospice before discontinuing blood pressure medications and blood thinners.  Briefly reviewed the risk/benefit of continuing/stopping blood thinners in context of patient's recent intracranial stent versus GI bleed.  Encouraged patient/family to consider DNR/DNI status understanding evidenced based poor outcomes in similar hospitalized patient, as the cause of arrest is likely associated with advanced chronic/terminal illness rather than an easily reversible acute cardio-pulmonary event. I explained that DNR/DNI does not change the medical plan and it only comes into effect after a person has arrested (died).  It is a protective measure to keep [place] from harming the patient in their last moments of life. Patient/Family were agreeable to DNR/DNI with understanding that she would not receive CPR, defibrillation, ACLS medications, or intubation.   Advance directives were considered and discussed.  Izora Gala Eiklor/niece is patient's healthcare power of attorney.  Educated provided on current [place] [id number] visitation policy.  Discussed with patient/family the importance of continued conversation with each other and the medical providers regarding overall plan of care and treatment options, ensuring decisions are within the context of the patient's values and GOCs.    Questions and concerns were addressed. The patient/family was encouraged to call with questions and/or concerns. PMT number was provided.  Per current [place] [id number] visitation policy for end-of-life, discussed with RN and verified that 3 visitors could be present in the  room at one time, may switch out, and visit 24/7.   Primary Decision Maker: HCPOA Izora Gala Eiklor/niece    SUMMARY OF RECOMMENDATIONS   Initiated full comfort measures Now DNR/DNI - durable DNR form completed and placed in shadow chart. Copy was made and will be scanned into Vynca/ACP tab Family want patient to return to [facility] with hospice services.  They are hopeful patient can be discharged tomorrow 9/21 or as soon as possible. They have no DME needs that would hold up discharge Family are not interested in invasive procedures such as endoscopy nor would they want to pursue feeding tube or hemodialysis if offered  Added orders for EOL symptom management and to reflect full comfort measures, as well as discontinued orders that were not focused on comfort.  Family would like to speak with patient's specialists and hospice before discontinuing blood pressure medications and blood thinners. TOC consulted for: coordinating patient's return to [facility] (or another facility if [facility] will not accept) with hospice services Unrestricted visitation orders were placed per current [place] EOL visitation  policy  Provide frequent assessments and administer PRN medications as clinically necessary to ensure EOL comfort PMT will continue to follow and support holistically   Code Status/Advance Care Planning: DNR   Palliative Prophylaxis:  Aspiration, Bowel Regimen, Delirium Protocol, Frequent Pain Assessment, Oral Care, and Turn Reposition  Additional Recommendations (Limitations, Scope, Preferences): Full Comfort Care  Psycho-social/Spiritual:  Desire for further Chaplaincy support:no Created space and opportunity for patient and family to express thoughts and feelings regarding patient's current medical situation.  Emotional support and therapeutic listening provided.  Prognosis:  < 6 months  Discharge Planning: [place] with Hospice       Primary Diagnoses: Present on Admission:  BRBPR (bright red blood per rectum)  AKI (acute kidney injury) ([place])  Left middle cerebral artery stroke ([place])  ABLA (acute blood loss anemia)  GI bleed   I have reviewed the medical record, interviewed the patient and family, and examined the patient. The following aspects are pertinent.  Past Medical History:  Diagnosis Date   Hypertension    Social History   Socioeconomic History   Marital status: Single    Spouse name: Not on file   Number of children: Not on file   Years of education: Not on file   Highest education level: Not on file  Occupational History   Not on file  Tobacco Use   Smoking status: Never   Smokeless tobacco: Never  Substance and Sexual Activity   Alcohol use: Not Currently   Drug use: Not Currently   Sexual activity: Not on file  Other Topics Concern   Not on file  Social History Narrative   Not on file   Social Determinants of Health   Financial Resource Strain: Not on file  Food Insecurity: Not on file  Transportation Needs: Not on file  Physical Activity: Not on file  Stress: Not on file  Social Connections: Not on file   Family History  Family history unknown: Yes   Scheduled Meds:  amLODipine  5 mg Oral Daily   aspirin EC  81 mg Oral Daily   atorvastatin  40 mg Oral Daily   levothyroxine  50 mcg Oral Daily   metoprolol succinate  50 mg Oral Daily   ticagrelor  90 mg Oral BID   Zinc Oxide   Topical TID   Continuous Infusions:  lactated ringers 125 mL/hr at 12/17/20 0554   PRN Meds:.acetaminophen **OR** acetaminophen, ondansetron **OR** ondansetron (ZOFRAN) IV Medications Prior to Admission:  Prior to Admission medications   Medication Sig Start Date End Date Taking? Authorizing Provider  amLODipine (NORVASC) 5 MG tablet Take 1 tablet (5 mg total) by mouth daily. 12/11/20  Yes Angiulli, Lavon Paganini, PA-C  aspirin EC 81 MG tablet Take 81 mg by mouth daily. Swallow whole.   Yes  [provider]  atorvastatin (LIPITOR) 40 MG tablet Take 1 tablet (40 mg total) by mouth daily. 12/11/20  Yes Angiulli, Lavon Paganini, PA-C  cephALEXin (KEFLEX) 500 MG capsule Take 500 mg by mouth 2 (two) times daily. Continuous 12/12/20  Yes [provider]  fluconazole (DIFLUCAN) 150 MG tablet Take 1 tablet (150 mg total) by mouth once a week. 12/17/20  Yes Angiulli, Lavon Paganini, PA-C  GOODSENSE ARTHRITIS PAIN 650 MG CR tablet Take 650 mg by mouth every 4 (four) hours as needed. 12/12/20  Yes [provider]  levothyroxine (SYNTHROID) 50 MCG tablet Take 1 tablet (50 mcg total) by mouth daily. 12/11/20  Yes Angiulli, Lavon Paganini, PA-C  losartan (COZAAR) 100 MG tablet Take 1 tablet (100 mg total) by mouth daily. 12/11/20  Yes Angiulli, Lavon Paganini, PA-C  metoprolol succinate (TOPROL-XL) 50 MG 24 hr tablet Take 1 tablet (50 mg total) by mouth daily. Take with or immediately following a meal. 12/11/20  Yes Angiulli, Lavon Paganini, PA-C  modafinil (PROVIGIL) 100 MG tablet Take 100 mg by mouth daily.   Yes [provider]  ticagrelor (BRILINTA) 90 MG TABS tablet Take 1 tablet (90 mg total) by mouth 2 (two) times daily. 12/11/20  Yes Angiulli, Lavon Paganini, PA-C  zinc oxide 20 % ointment Apply 1 application topically See admin instructions. 3 times a week as needed for irritation   Yes [provider]  magic mouthwash SOLN Take 5 mLs by mouth See admin instructions. Swish and spit 5 mls 4 times daily x 10 days    [provider]   Allergies  Allergen Reactions   Enalapril     Other reaction(s): diarrhea   Fosinopril     Other reaction(s): diarrhea   Lisinopril     Other reaction(s): diarrhea   Review of Systems  Unable to perform ROS: Mental status change   Physical Exam Vitals and nursing note reviewed.  Constitutional:      General: She is not in acute distress.    Appearance: She is ill-appearing.  Pulmonary:     Effort: No respiratory distress.  Skin:     General: Skin is warm and dry.  Neurological:     Mental Status: She is alert. She is disoriented and confused.     Motor: Weakness present.  Psychiatric:        Cognition and Memory: Cognition is impaired. Memory is impaired.    Vital Signs: BP (!) 107/43   Pulse (!) 55   Temp 98.8 F (37.1 C) (Oral)   Resp (!) 24   Ht 5' (1.524 m)   Wt 63.5 kg   SpO2 98%   BMI 27.34 kg/m          SpO2: SpO2: 98 % O2 Device:SpO2: 98 % O2 Flow Rate: .   IO: Intake/output summary:  Intake/Output Summary (Last 24 hours) at 12/17/2020 1447 Last data filed at 12/17/2020 0138 Gross per 24 hour  Intake 500 ml  Output --  Net 500 ml    LBM:   Baseline Weight: Weight: 63.5 kg Most recent weight: Weight: 63.5 kg     Palliative Assessment/Data: PPS 20-30%     Time In/Out: 1600-1620, 1715-1900 Time Total: 125 minutes  Greater than 50%  of this time was spent counseling and coordinating care related to the above assessment and plan.  Signed by: Lin Landsman, NP   Please contact Palliative Medicine Team phone at [phone] for questions and concerns.  For individual provider: See Shea Evans

## 2020-12-17 NOTE — Plan of Care (Addendum)
Called by Dr. Butler Denmark regarding patient recent GI bleeding while on antiplatelet.  Chart reviewed, patient was admitted on 11/19/2020 for left MCA infarcts, found to have left M2 near occlusion status post thrombectomy and left MCA stent, achieving TICI3 revascularization.  She was put on aspirin and Brilinta post stenting.  She was later admitted to inpatient rehab and then back to assisted living facility.  Per Dr. Butler Denmark, patient seem to have cognitive impairment at assisted living facility at least after stroke, not orientated and not able to provide history.  However per staff she had GI bleeding found in her diaper.  She was admitted for BRBPR.  Hemoglobin at baseline around 10.5, yesterday hemoglobin 10.8, today 9.3.  Neurology was called for recommendations on antiplatelet.  GI Dr. Concha Se saw the patient today and considered diverticulosis, no urgent procedure recommended at this time.  I also discussed with Dr. Sherlon Handing interventional radiologist, she prefer to continue DAPT with aspirin and Brilinta for 3 months due to intracranial stent with intracranial stenosis.  There will be high risk to stop DAPT for stent occlusion.  However, if patient "bleeding out" or with a severe anemia needing blood transfusion, DAPT may have to be stopped, but even in that situation monotherapy still preferred if possible.  In meantime, will check P2Y12 level, if significantly low, we may decrease her into dosage.  Of note, palliative care also consulted, will follow up.  Discussed with Dr. Butler Denmark.  Marvel Plan, MD PhD Stroke Neurology 12/17/2020 11:18 AM   ADDENDUM:  Per Dr. Butler Denmark, palliative care on board and family has opted for comfort care measure. Will sign off for now, please feel free to call with any questions.   Marvel Plan, MD PhD Stroke Neurology 12/17/2020 7:19 PM

## 2020-12-17 NOTE — Consult Note (Signed)
WOC Nurse Consult Note: Reason for Consult: irritant contact dermatitis,  incontinence associated dermatitis and intertriginous dermatitis Wound type: Moisture  ICD-10 CM Codes for Irritant Dermatitis L24A2 - Due to fecal, urinary or dual incontinence L30.4  - Erythema intertrigo. Also used for abrasion of the hand, chafing of the skin, dermatitis due to sweating and friction, friction dermatitis, friction eczema, and genital/thigh intertrigo.    Pressure Injury POA: N/A Measurement:4cm x 0.2cm area of intertriginous dermatitis at the gluteal cleft and 4cm x 0.2cm of intertriginous dermatitis at the right inguinal area,  Perineal area with erythema and edema consistent with IAD Wound HER:DEYC, moist Drainage (amount, consistency, odor) scant serous Periwound:as noted above Dressing procedure/placement/frequency: I have provided a mattress replacement with low air loss feature for management of microclimate, bilateral Prevalon Boot for PI prevention of the heels. Turning and repositioning in accordance with our house protocols to minimize time in the supine position. No disposable briefs of under pads; only DermaTherapy under pads are to be used beneath patient. Zinc Oxide/Desitin ointment is to be used topically in the affected areas three times daily and PRN soiling.  Consideration of a few doses of a system antifungal (e.g., Diflucan) unless contraindicated is recommended.  WOC nursing team will not follow, but will remain available to this patient, the nursing and medical teams.  Please re-consult if needed. Thanks, Ladona Mow, MSN, RN, GNP, [place]  Pager# [phone]

## 2020-12-17 NOTE — ED Notes (Signed)
Patient is resting comfortably. Pt denies any complaints. Family at bedside. No acute changes noted. Will continue to monitor. Lights dimmed, side rails up 2, call bell within reach.  

## 2020-12-17 NOTE — ED Notes (Signed)
Date and time results received: 12/17/20 0033   Test: lactic  Critical Value: lactic 2.4  Name of Provider Notified: pollina  Orders Received? Or Actions Taken?:  No further orders received.

## 2020-12-18 ENCOUNTER — Other Ambulatory Visit (HOSPITAL_COMMUNITY): Payer: Self-pay

## 2020-12-18 DIAGNOSIS — I63512 Cerebral infarction due to unspecified occlusion or stenosis of left middle cerebral artery: Secondary | ICD-10-CM | POA: Diagnosis not present

## 2020-12-18 DIAGNOSIS — Z515 Encounter for palliative care: Secondary | ICD-10-CM | POA: Diagnosis not present

## 2020-12-18 DIAGNOSIS — N179 Acute kidney failure, unspecified: Secondary | ICD-10-CM | POA: Diagnosis not present

## 2020-12-18 DIAGNOSIS — K625 Hemorrhage of anus and rectum: Secondary | ICD-10-CM | POA: Diagnosis not present

## 2020-12-18 DIAGNOSIS — D62 Acute posthemorrhagic anemia: Secondary | ICD-10-CM | POA: Diagnosis not present

## 2020-12-18 MED ORDER — GLYCOPYRROLATE 1 MG PO TABS: 1.0000 mg | ORAL_TABLET | ORAL | Status: AC | PRN

## 2020-12-18 MED ORDER — LORAZEPAM 1 MG PO TABS
1.0000 mg | ORAL_TABLET | ORAL | 0 refills | Status: AC | PRN
  Filled 2020-12-18: qty 4, 1d supply, fill #0

## 2020-12-18 MED ORDER — MORPHINE SULFATE 20 MG/5ML PO SOLN
5.0000 mg | ORAL | 0 refills | Status: DC | PRN
  Filled 2020-12-18: qty 25, 4d supply, fill #0

## 2020-12-18 MED ORDER — MORPHINE SULFATE (CONCENTRATE) 20 MG/ML PO SOLN
5.0000 mg | ORAL | 0 refills | Status: AC | PRN
  Filled 2020-12-18: qty 30, 7d supply, fill #0

## 2020-12-18 MED ORDER — OXYCODONE HCL 20 MG/ML PO CONC
6.0000 mg | ORAL | 0 refills | Status: DC | PRN
  Filled 2020-12-18: qty 5, 2d supply, fill #0

## 2020-12-18 MED ORDER — HALOPERIDOL 0.5 MG PO TABS: 0.5000 mg | ORAL_TABLET | ORAL | Status: AC | PRN

## 2020-12-18 NOTE — Progress Notes (Addendum)
New Admission Note:   Arrival Method:  Stretcher Mental Orientation: confused Telemetry: none Assessment: Completed Skin: see flowsheet IV:nsl Pain: none Tubes: none Safety Measures: Safety Fall Prevention Plan has been discussed Admission: Completed [facility] Orientation: Patient has been orientated to the room, unit and staff.  Family: none at bedside  Orders have been reviewed and implemented. Will continue to monitor the patient. Call light has been placed within reach and bed alarm has been activated.   Artemio Aly BSN, RN Phone number: [phone] New Admission Not

## 2020-12-18 NOTE — Progress Notes (Signed)
Palliative Medicine RN Note: Rec'd a call from daughter [name], who had questions about the discharge process and timing. She is currently in the room with the pt. I have sent a message to LCSW Erie Noe requesting she go see the family.  Margret Chance Lukas Pelcher, RN, BSN, [facility] Palliative Medicine Team 12/18/2020 12:11 PM Office [phone]

## 2020-12-18 NOTE — Progress Notes (Signed)
Nutrition Brief Note  Chart reviewed. Pt now transitioning to comfort care.  No further nutrition interventions planned at this time.  Please re-consult as needed.   Marguerite Jarboe W, RD, LDN, CDCES Registered Dietitian II Certified Diabetes Care and Education Specialist Please refer to AMION for RD and/or RD on-call/weekend/after hours pager   

## 2020-12-18 NOTE — Progress Notes (Addendum)
Civil engineer, contracting [facility]) Hospital Liaison: RN note     Notified by Transition of Care Manger of patient/family request for [facility] services at home after discharge. Chart and patient information under review by [facility] physician. Hospice eligibility pending currently.     Writer spoke with pt, pt's HCPOA and niece Elana Alm, and Rosanne Ashing at bedside to initiate education related to hospice philosophy, services and team approach to care. Elana Alm and Rosanne Ashing verbalized understanding of information given. Per discussion, plan is for discharge back to [facility] today by private vehicle.    Please send signed and completed DNR form home with patient/family.   Please provide prescriptions for mediations at discharge to ensure comfort until patient can be admitted onto hospice services.   DME needs have been discussed.  Pt has no new DME needs at this time.    [facility] aware of the above. Please notify [facility] when patient is ready to leave the unit at discharge. (Call [phone] or [phone] after 5pm.)  [facility] information and contact numbers given to  [place].       Please do not hesitate to call with questions.   Thank you for the opportunity to participate in this patient's care.  Gillian Scarce, BSN, RN       [facility] Liaison (listed on AMION under Hospice /[phone]  [phone] (24h on call)

## 2020-12-18 NOTE — Progress Notes (Signed)
Patient ID: Maria Dyer, female   DOB: 05/28/1930, 87 y.o.   MRN: [id number]       Patient is discharging today back to [facility] with hospice care. Needing Rx for comfort meds.  Notified by pharmacist that oxycodone concentrate solution is not stocked in [facility] pharmacy.   Sent Rx for morphine concentrate solution 20 mg/ml, 0.25 ml every 4 hours prn pain or dyspnea (quantity - 30 ml with no refills)     Sherlean Foot, NP-C Palliative Medicine   Please call Palliative Medicine team phone with any questions [phone]. For individual providers please see AMION.   Total time: 15 minutes

## 2020-12-18 NOTE — Discharge Summary (Addendum)
Physician Discharge Summary  Maria Dyer FSE:[id number] DOB: 1931-03-10 DOA: 12/16/2020  PCP: Merrilee Seashore, MD  Admit date: 12/16/2020 Discharge date: 12/18/2020  Admitted From: ALF Disposition:  facility w. hospice  Recommendations for Outpatient Follow-up:  Follow up with PCP / PMT]  Home Health:no  Equipment/Devices: non  Discharge Condition: Stable Code Status:   Code Status: DNR Diet recommendation:  Diet Order             Diet regular Room service appropriate? Yes; Fluid consistency: Thin  Diet effective now                    Brief/Interim Summary:  thrombectomy, and intracoronary stenting and on aspirin and Brilinta-was at inpatient rehab then discharged to ALF, also history of hypertension, hypothyroidism type 2 diabetes CKD stage IIIA brought from [facility] with 1 episode of blood noted in her diaper.  Of note, the patient has not been ambulating much at her facility due to fear of falling. She was seen in the ED CT abdomen pelvis without contrast no acute finding, found to have bright red blood per rectum, AKI, acute blood loss anemia lactic acidosis, confusion and admitted for further management Unfortunately unable to stop her antiplatelet due to recent stent and thrombectomy and stroke.  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   Plan is to discharge to facility with hospice services once available. Pressure Ulcer: Pressure Injury 12/17/20 Rectum Bilateral Stage 2 -  Partial thickness loss of dermis presenting as a shallow open injury with a red, pink wound bed without slough. rectum,buttocks partial thickness shallow open red wound no slough (Active)  12/17/20   Location: Rectum  Location Orientation: Bilateral  Staging: Stage 2 -  Partial thickness loss of dermis presenting as a shallow open injury with a red, pink wound bed without slough.  Wound Description (Comments): rectum,buttocks partial thickness shallow open red wound no slough  Present on Admission: Yes    Consults: Pmt Gi Wound care  Subjective: Hard of hearing, responds with "yes" and "okay" No family at the bedside. Discharge Exam: Vitals:   12/18/20 0600 12/18/20 0949  BP: (!) 133/47 (!) 147/53  Pulse: 72 72  Resp: 16 17  Temp: 97.9 F (36.6 C) 98.2 F (36.8 C)  SpO2: 100% 98%   General: Pt is alert, awake, not in acute distress Cardiovascular: RRR, S1/S2 +, no rubs, no gallops Respiratory: CTA bilaterally, no wheezing, no rhonchi Abdominal: Soft, NT, ND, bowel sounds + Extremities: no edema, no cyanosis  Discharge Instructions   Allergies as of 12/18/2020       Reactions   Enalapril    Other reaction(s): diarrhea   Fosinopril    Other reaction(s): diarrhea   Lisinopril    Other reaction(s): diarrhea        Medication List     STOP taking these medications    cephALEXin 500 MG capsule Commonly known as: KEFLEX   fluconazole 150 MG tablet  Commonly known as: DIFLUCAN   losartan 100 MG tablet Commonly known as: COZAAR       TAKE these medications    amLODipine 5 MG tablet Commonly known as: NORVASC Take 1 tablet (5 mg total) by mouth daily.   aspirin EC 81 MG tablet Take 81 mg by mouth daily. Swallow whole.   atorvastatin 40 MG tablet Commonly known as: LIPITOR Take 1 tablet (40 mg total) by mouth  daily.   Brilinta 90 MG Tabs tablet Generic drug: ticagrelor Take 1 tablet (90 mg total) by mouth 2 (two) times daily.   glycopyrrolate 1 MG tablet Commonly known as: ROBINUL Take 1 tablet (1 mg total) by mouth every 4 (four) hours as needed (excessive secretions).   GoodSense Arthritis Pain 650 MG CR tablet Generic drug: acetaminophen Take 650 mg by mouth every 4 (four) hours as needed.   haloperidol 0.5 MG tablet Commonly known as: HALDOL Take 1 tablet (0.5 mg total) by mouth every 4 (four) hours as needed for agitation (or delirium).   levothyroxine 50 MCG tablet Commonly known as: SYNTHROID Take 1 tablet (50 mcg total) by mouth daily.   LORazepam 1 MG tablet Commonly known as: ATIVAN Take 1 tablet (1 mg total) by mouth every hour as needed for up to 4 doses for anxiety, seizure or sedation (distress, respiratory rate >25, increased work of breathing, shortness of breath).   magic mouthwash Soln Take 5 mLs by mouth See admin instructions. Swish and spit 5 mls 4 times daily x 10 days   metoprolol succinate 50 MG 24 hr tablet Commonly known as: TOPROL-XL Take 1 tablet (50 mg total) by mouth daily. Take with or immediately following a meal.   modafinil 100 MG tablet Commonly known as: PROVIGIL Take 100 mg by mouth daily.   morphine 10 MG/5ML solution Take 2.5 mLs (5 mg total) by mouth every 4 (four) hours as needed.   zinc oxide 20 % ointment Apply 1 application topically See admin instructions. 3 times a week as needed for irritation        Follow-up Information     Merrilee Seashore, MD Follow up in 1 week(s).   Specialty: Internal Medicine Contact information: [address] [place] [address] [phone]                Allergies  Allergen Reactions   Enalapril     Other reaction(s): diarrhea   Fosinopril     Other reaction(s): diarrhea   Lisinopril     Other reaction(s): diarrhea    The results of significant  diagnostics from this hospitalization (including imaging, microbiology, ancillary and laboratory) are listed below for reference.    Microbiology: Recent Results (from the past 240 hour(s))  SARS CORONAVIRUS 2 (TAT 6-24 HRS) Nasopharyngeal Nasopharyngeal Swab     Status: None   Collection Time: 12/11/20  8:20 AM   Specimen: Nasopharyngeal Swab  Result Value Ref Range Status   SARS Coronavirus 2 NEGATIVE NEGATIVE Final    Comment: (NOTE) SARS-CoV-2 target nucleic acids are NOT DETECTED.  The SARS-CoV-2 RNA is generally detectable in upper and lower respiratory specimens during the acute phase of infection. Negative results do not preclude SARS-CoV-2 infection, do not rule out co-infections with other pathogens, and should not be used as the sole basis for treatment or other patient management decisions. Negative results must be combined with clinical observations, patient history, and epidemiological information. The expected result is Negative.  Fact Sheet for Patients: SugarRoll.be  Fact Sheet for Healthcare Providers: https://www.woods-mathews.com/  This test is not yet approved or cleared by the [place] FDA and  has been authorized for detection and/or diagnosis of SARS-CoV-2 by FDA under an Emergency Use Authorization (EUA). This EUA will remain  in effect (meaning this test can be used) for the duration of the COVID-19 declaration under Se ction 564(b)(1) of the Act, 21 U.S.C. section 360bbb-3(b)(1), unless the authorization is terminated or revoked sooner.  Performed at [facility], [place] [address], [place], [place] [address]   Resp Panel by RT-PCR (Flu A&B, Covid) Nasopharyngeal Swab     Status: None   Collection Time: 12/17/20  2:09 AM   Specimen: Nasopharyngeal Swab; Nasopharyngeal(NP) swabs in vial transport medium  Result Value Ref Range Status   SARS Coronavirus 2 by RT PCR NEGATIVE NEGATIVE Final    Comment:  (NOTE) SARS-CoV-2 target nucleic acids are NOT DETECTED.  The SARS-CoV-2 RNA is generally detectable in upper respiratory specimens during the acute phase of infection. The lowest concentration of SARS-CoV-2 viral copies this assay can detect is 138 copies/mL. A negative result does not preclude SARS-Cov-2 infection and should not be used as the sole basis for treatment or other patient management decisions. A negative result may occur with  improper specimen collection/handling, submission of specimen other than nasopharyngeal swab, presence of viral mutation(s) within the areas targeted by this assay, and inadequate number of viral copies(<138 copies/mL). A negative result must be combined with clinical observations, patient history, and epidemiological information. The expected result is Negative.  Fact Sheet for Patients:  EntrepreneurPulse.com.au  Fact Sheet for Healthcare Providers:  IncredibleEmployment.be  This test is no t yet approved or cleared by the [place] FDA and  has been authorized for detection and/or diagnosis of SARS-CoV-2 by FDA under an Emergency Use Authorization (EUA). This EUA will remain  in effect (meaning this test can be used) for the duration of the COVID-19 declaration under Section 564(b)(1) of the Act, 21 U.S.C.section 360bbb-3(b)(1), unless the authorization is terminated  or revoked sooner.       Influenza A by PCR NEGATIVE NEGATIVE Final   Influenza B by PCR NEGATIVE NEGATIVE Final    Comment: (NOTE) The Xpert Xpress SARS-CoV-2/FLU/RSV plus assay is intended as an aid in the diagnosis of influenza from Nasopharyngeal swab specimens and should not be used as a sole basis for treatment. Nasal washings and aspirates are unacceptable for Xpert Xpress SARS-CoV-2/FLU/RSV testing.  Fact Sheet for Patients: EntrepreneurPulse.com.au  Fact Sheet for Healthcare  Providers: IncredibleEmployment.be  This test is not yet approved or cleared by the [place] FDA and has been authorized for detection and/or diagnosis of SARS-CoV-2 by FDA under an Emergency Use Authorization (EUA). This EUA will remain in effect (meaning this test can be used) for the duration of the COVID-19 declaration under Section 564(b)(1) of the Act, 21 U.S.C. section 360bbb-3(b)(1), unless the authorization is terminated or revoked.  Performed at [facility], [place] [address] [place], [place] [address]     Procedures/Studies: CT ABDOMEN PELVIS WO CONTRAST  Result Date: 12/17/2020 CLINICAL DATA:  87 year old with abdominal pain today. Diverticulitis suspected. EXAM: CT ABDOMEN AND PELVIS WITHOUT CONTRAST TECHNIQUE: Multidetector CT imaging of the abdomen and pelvis was performed following the standard protocol without IV contrast. COMPARISON:  None. FINDINGS: Lower chest: Breathing motion artifact. Suspected emphysema. The heart is normal in size. Dense mitral annulus calcifications. Coronary artery calcifications. Hepatobiliary: Motion artifact through the upper abdomen. No  evidence of focal hepatic abnormality. Curvilinear calcification in the region of the gallbladder fundus may be gallstone or wall calcification. There is no abnormal gallbladder distension. Common bile duct measures 10 mm, mildly dilated but may be normal for age. No visualized choledocholithiasis. Pancreas: Parenchymal atrophy. No ductal dilatation or inflammation. Spleen: Normal in size without focal abnormality. Adrenals/Urinary Tract: Normal adrenal glands. No hydronephrosis or perinephric edema. Renal calculi. No evidence of focal renal abnormality on this unenhanced exam. Decompressed ureters. Unremarkable urinary bladder. Stomach/Bowel: The stomach is nondistended. No small bowel obstruction or inflammatory change. Appendix is tentatively visualized and normal. There is no  appendicitis. Small to moderate volume of stool throughout the colon. No diverticulitis or colonic inflammation. Vascular/Lymphatic: Moderate aortic and branch atherosclerosis. No aortic aneurysm. There is no portal venous or mesenteric gas. No perivascular stranding in the left groin at site of recent vascular access. Small retroperitoneal lymph nodes are not enlarged by size criteria. No bulky abdominopelvic adenopathy Reproductive: Normal for age uterine atrophy. Quiescent ovaries. No adnexal mass. Other: No free air, free fluid, or intra-abdominal fluid collection. No abdominal wall hernia. Musculoskeletal: Scoliosis and degenerative change in the spine. Degenerative change in both hips. There are no acute or suspicious osseous abnormalities. IMPRESSION: 1. No acute abnormality or explanation for abdominal pain. 2. Curvilinear calcification in the region of the gallbladder fundus may be gallstone or wall calcification. No abnormal gallbladder distension. Aortic Atherosclerosis (ICD10-I70.0). Electronically Signed   By: Keith Rake M.D.   On: 12/17/2020 01:00   DG Chest 1 View  Result Date: 11/24/2020 CLINICAL DATA:  Aspiration into airway. EXAM: CHEST  1 VIEW COMPARISON:  None. FINDINGS: Scoliotic curvature of the thoracolumbar spine. No pneumothorax. Healed left rib fractures. The heart, hila, and mediastinum are unremarkable with no acute abnormalities. Calcified atherosclerosis is seen in the thoracic aorta. No pulmonary nodules or masses. No focal infiltrates. No acute abnormalities. IMPRESSION: No active disease. Electronically Signed   By: Dorise Bullion III M.D.   On: 11/24/2020 13:36   CT HEAD WO CONTRAST  Result Date: 11/19/2020 CLINICAL DATA:  Stroke, follow up Left M2 occlusion status post M1-M2 stenting. EXAM: CT HEAD WITHOUT CONTRAST TECHNIQUE: Contiguous axial images were obtained from the base of the skull through the vertex without intravenous contrast. COMPARISON:  Same day CT  head. FINDINGS: Motion limited study.  Within this limitation: Brain: Similar mild hypoattenuation and possible loss of grey/white differentiation in the inferior left insula with possibly slightly increased hypoattenuation in the anterior left temporal lobe, although streak artifact limits evaluation. No evidence of acute hemorrhage, hydrocephalus, mass lesion, midline shift, or visible extra-axial collection. Vascular: Status post endovascular stenting of the left MCA. Residual contrast in the vasculature. Skull: No acute fracture. Sinuses/Orbits: Mild paranasal sinus mucosal thickening. No acute renal findings. Other: No mastoid effusions. IMPRESSION: 1. Status post endovascular stenting of the left MCA. Possibly slight increase in edema/hypoattenuation in the anterior left temporal lobe, although streak limits evaluation in this region. If the patient is able, an MRI could better evaluate for acute infarct. 2. No evidence of acute hemorrhage. Electronically Signed   By: Margaretha Sheffield M.D.   On: 11/19/2020 20:06   MR ANGIO HEAD WO CONTRAST  Result Date: 11/20/2020 CLINICAL DATA:  Follow-up examination for acute stroke, left M2 occlusion, status post thrombectomy and stenting. EXAM: MRI HEAD WITHOUT CONTRAST MRA HEAD WITHOUT CONTRAST TECHNIQUE: Multiplanar, multi-echo pulse sequences of the brain and surrounding structures were acquired without intravenous contrast. Angiographic images of the  Circle of Willis were acquired using MRA technique without intravenous contrast. COMPARISON:  Prior CTs from earlier the same day. FINDINGS: MRI HEAD FINDINGS Brain: Generalized age-related cerebral atrophy. Mild chronic microvascular ischemic disease noted involving the periventricular white matter. Remote lacunar infarct present at the posterior right lentiform nucleus/internal capsule. Few small remote bilateral cerebellar infarcts. Patchy restricted diffusion seen involving the left parietal and temporal lobes,  consistent with acute left MCA distribution infarct (series 5, images 83, 74). Involvement is primarily cortical in nature, although there are some patchy subcortical involvement. Minimal associated petechial hemorrhage without frank intraparenchymal hematoma (series 14, image 33). No significant regional mass effect. No other evidence for acute or subacute ischemia. Gray-white matter differentiation otherwise maintained. No other acute or chronic intracranial hemorrhage elsewhere within the brain. No mass lesion, midline shift or mass effect. No hydrocephalus or extra-axial fluid collection. Pituitary gland suprasellar region normal. Midline structures intact. Vascular: Major intracranial vascular flow voids are maintained. Susceptibility artifact related to stenting noted at the left M1/M2 segments. Skull and upper cervical spine: Craniocervical junction within normal limits. Bone marrow signal intensity normal. No scalp soft tissue abnormality. Sinuses/Orbits: Globes and orbital soft tissues demonstrate no acute finding. Axial myopia noted. Scattered mucosal thickening noted throughout the paranasal sinuses. No air-fluid levels to suggest acute sinusitis. Moderate right with small left mastoid effusions. Visualized nasopharynx unremarkable. Other: 7 mm T2 hyperintense lesion seen within the right parotid gland (series 17, image 17), indeterminate. MRA HEAD FINDINGS Anterior circulation: Examination degraded by my a shin artifact. Distal cervical ICAs remain widely patent antegrade flow. Petrous segments widely patent. Atheromatous irregularity throughout the carotid siphons without high-grade stenosis. A1 segments, anterior communicating artery complex common anterior cerebral arteries remain patent without definite stenosis. Right M1 segment and distal right MCA branches well perfused and widely patent. Attenuated but patent flow within the proximal left M1 segment. Susceptibility artifact related to a vascular  stent spanning the left M1-M2 segments. Flow through the stent itself is not assessed by MRA. Markedly attenuated flow seen beyond the stent within the distal left MCA branches. Posterior circulation: Both vertebral arteries remain patent to the vertebrobasilar junction. Both PICA remain patent. Basilar diminutive but patent to its distal aspect without stenosis. Superior cerebral arteries patent bilaterally. Fetal type origin of both PCAs, both of which remain patent to their distal aspects. Anatomic variants: Fetal type origin of the PCAs with overall diminutive vertebrobasilar system. No intracranial aneurysm. IMPRESSION: MRI HEAD IMPRESSION: 1. Multifocal acute ischemic left MCA distribution infarcts involving the left parietal and temporal lobes. Minimal associated petechial hemorrhage without frank intraparenchymal hematoma or mass effect. 2. No other acute intracranial abnormality. 3. Underlying age-related cerebral atrophy with mild chronic small vessel ischemic disease, with remote lacunar infarcts involving the right lentiform nucleus/internal capsule, and bilateral cerebellar hemispheres. MRA HEAD IMPRESSION: 1. Technically limited exam due to motion artifact. 2. Interval placement of a vascular stent spanning the left M1-M2 segments. Flow within the stent itself is not assessed by MRA, obscured by susceptibility artifact. Markedly attenuated and reduced flow seen beyond the stent within the left MCA branches distally, raising the possibility for intra stent stenosis or other pathology. Finding could be further assessed with dedicated CTA or repeat arteriogram as warranted. 3. Otherwise stable intracranial MRA, with no other new or progressive finding. 4. Fetal type origin of the PCAs with overall diminutive vertebrobasilar system. Electronically Signed   By: Jeannine Boga M.D.   On: 11/20/2020 02:32   MR BRAIN WO CONTRAST  Result Date: 11/20/2020 CLINICAL DATA:  Follow-up examination for acute  stroke, left M2 occlusion, status post thrombectomy and stenting. EXAM: MRI HEAD WITHOUT CONTRAST MRA HEAD WITHOUT CONTRAST TECHNIQUE: Multiplanar, multi-echo pulse sequences of the brain and surrounding structures were acquired without intravenous contrast. Angiographic images of the Circle of Willis were acquired using MRA technique without intravenous contrast. COMPARISON:  Prior CTs from earlier the same day. FINDINGS: MRI HEAD FINDINGS Brain: Generalized age-related cerebral atrophy. Mild chronic microvascular ischemic disease noted involving the periventricular white matter. Remote lacunar infarct present at the posterior right lentiform nucleus/internal capsule. Few small remote bilateral cerebellar infarcts. Patchy restricted diffusion seen involving the left parietal and temporal lobes, consistent with acute left MCA distribution infarct (series 5, images 83, 74). Involvement is primarily cortical in nature, although there are some patchy subcortical involvement. Minimal associated petechial hemorrhage without frank intraparenchymal hematoma (series 14, image 33). No significant regional mass effect. No other evidence for acute or subacute ischemia. Gray-white matter differentiation otherwise maintained. No other acute or chronic intracranial hemorrhage elsewhere within the brain. No mass lesion, midline shift or mass effect. No hydrocephalus or extra-axial fluid collection. Pituitary gland suprasellar region normal. Midline structures intact. Vascular: Major intracranial vascular flow voids are maintained. Susceptibility artifact related to stenting noted at the left M1/M2 segments. Skull and upper cervical spine: Craniocervical junction within normal limits. Bone marrow signal intensity normal. No scalp soft tissue abnormality. Sinuses/Orbits: Globes and orbital soft tissues demonstrate no acute finding. Axial myopia noted. Scattered mucosal thickening noted throughout the paranasal sinuses. No air-fluid  levels to suggest acute sinusitis. Moderate right with small left mastoid effusions. Visualized nasopharynx unremarkable. Other: 7 mm T2 hyperintense lesion seen within the right parotid gland (series 17, image 17), indeterminate. MRA HEAD FINDINGS Anterior circulation: Examination degraded by my a shin artifact. Distal cervical ICAs remain widely patent antegrade flow. Petrous segments widely patent. Atheromatous irregularity throughout the carotid siphons without high-grade stenosis. A1 segments, anterior communicating artery complex common anterior cerebral arteries remain patent without definite stenosis. Right M1 segment and distal right MCA branches well perfused and widely patent. Attenuated but patent flow within the proximal left M1 segment. Susceptibility artifact related to a vascular stent spanning the left M1-M2 segments. Flow through the stent itself is not assessed by MRA. Markedly attenuated flow seen beyond the stent within the distal left MCA branches. Posterior circulation: Both vertebral arteries remain patent to the vertebrobasilar junction. Both PICA remain patent. Basilar diminutive but patent to its distal aspect without stenosis. Superior cerebral arteries patent bilaterally. Fetal type origin of both PCAs, both of which remain patent to their distal aspects. Anatomic variants: Fetal type origin of the PCAs with overall diminutive vertebrobasilar system. No intracranial aneurysm. IMPRESSION: MRI HEAD IMPRESSION: 1. Multifocal acute ischemic left MCA distribution infarcts involving the left parietal and temporal lobes. Minimal associated petechial hemorrhage without frank intraparenchymal hematoma or mass effect. 2. No other acute intracranial abnormality. 3. Underlying age-related cerebral atrophy with mild chronic small vessel ischemic disease, with remote lacunar infarcts involving the right lentiform nucleus/internal capsule, and bilateral cerebellar hemispheres. MRA HEAD IMPRESSION: 1.  Technically limited exam due to motion artifact. 2. Interval placement of a vascular stent spanning the left M1-M2 segments. Flow within the stent itself is not assessed by MRA, obscured by susceptibility artifact. Markedly attenuated and reduced flow seen beyond the stent within the left MCA branches distally, raising the possibility for intra stent stenosis or other pathology. Finding could be further assessed with dedicated CTA  or repeat arteriogram as warranted. 3. Otherwise stable intracranial MRA, with no other new or progressive finding. 4. Fetal type origin of the PCAs with overall diminutive vertebrobasilar system. Electronically Signed   By: Jeannine Boga M.D.   On: 11/20/2020 02:32   IR Intra Cran Stent  Result Date: 11/20/2020 INDICATION: 87 year old female with past medical history significant essential hypertension, severe hearing impairment, breast cancer s/p mastectomy, thoracolumbar scoliosis, and hypothyroidism. She presents with global aphasia, NIHSS 6. Her last known well was 2:18 p.m. on 11/18/2020. Her baseline modified Rankin scale is 1. Head CT showed a small left MCA territory infarct (ASPECTS 8). CT angiogram of the head and neck showed a left M2/MCA occlusion. CT perfusion showed a 5 mL core infarct with 44 mL penumbra. She was taken to our service for a diagnostic cerebral angiogram and mechanical thrombectomy. EXAM: ULTRASOUND-GUIDED VASCULAR ACCESS DIAGNOSTIC CEREBRAL ANGIOGRAM MECHANICAL THROMBECTOMY INTRACRANIAL STENTING FLAT PANEL HEAD CT COMPARISON:  CT/CT angiogram of the head and neck November 19, 2020 MEDICATIONS: Intravenous cangrelor bolus and drip ANESTHESIA/SEDATION: The procedure was performed under general anesthesia. CONTRAST:  130 mL of Omnipaque 240 milligram/mL FLUOROSCOPY TIME:  Fluoroscopy Time: 27 minutes 12 seconds (752 mGy). COMPLICATIONS: None immediate. TECHNIQUE: Informed written consent was obtained from the patient's niece after a thorough discussion  of the procedural risks, benefits and alternatives. All questions were addressed. Maximal Sterile Barrier Technique was utilized including caps, mask, sterile gowns, sterile gloves, sterile drape, hand hygiene and skin antiseptic. A timeout was performed prior to the initiation of the procedure. The left groin was prepped and draped in the usual sterile fashion. Using a micropuncture kit and the modified Seldinger technique, access was gained to the left common femoral artery and an 8 French sheath was placed. Real-time ultrasound guidance was utilized for vascular access including the acquisition of a permanent ultrasound image documenting patency of the accessed vessel. Under fluoroscopy, an 8 [place] Walrus balloon guide catheter was navigated over a 6 [place] Berenstein 2 catheter and a 0.035" Terumo Glidewire into the aortic arch. The catheter was placed into the left common carotid artery and then advanced into the left internal carotid artery. The inner catheter was removed. Frontal and lateral angiograms of the head were obtained. FINDINGS: 1. Atherosclerotic changes are noted in the left common femoral artery which has adequate caliber for vascular access. 2. Severe stenosis at the distal left M1/MCA with occlusion of the anterior temporal branch which shows mildly delayed retrograde opacification. There is low distal opacification of the remainder of the left MCA vascular tree. PROCEDURE: Under biplane roadmap, a zoom 71 aspiration catheter was navigated over an Aristotle 14 microguidewire into the cavernous segment of the right ICA. The aspiration catheter was then advanced to the level of occlusion/near occlusion and connected to an aspiration pump. Continuous aspiration was performed for 3 minutes. The guide catheter balloon was inflated and the catheter was connected to a VacLok syringe. The aspiration catheter was subsequently removed under constant aspiration. The guide catheter was aspirated for  debris. Left ICA angiograms with frontal and lateral views of the head showed improved anterograde flow the left MCA vascular tree with persistent severe stenosis. Under biplane roadmap, a zoom 71 aspiration catheter was navigated over an Aristotle 14 microguidewire into the cavernous segment of the right ICA. The aspiration catheter was then advanced to the level of occlusion/near occlusion and connected to an aspiration pump. Continuous aspiration was performed for 3 minutes. The guide catheter balloon was inflated and the  catheter was connected to a VacLok syringe. The aspiration catheter was subsequently removed under constant aspiration. The guide catheter was aspirated for debris. Left ICA angiograms with frontal and lateral views of the head showed persistent severe stenosis. Patient was loaded on cangrelor and drip was initiated. Under biplane roadmap, a zoom 71 aspiration catheter was navigated over a phenom 21 microcatheter and a synchro support microguidewire into the cavernous segment of the right ICA. The microcatheter was then navigated over the wire into the left M2/MCA posterior division branch. Then, a 4.5 x 30 mm neural form atlas stent was deployed spanning the left M1 and M2/MCA posterior division branch. Left internal carotid artery angiogram showed adequate stent position across the left MCA stenosis with mild residual stenosis. Delayed left internal carotid artery angiograms with frontal and lateral views of the head showed further improvement of the degree of stenosis with improvement of the anterograde flow and no evidence of in stent thrombosis. Flat panel CT of the head was obtained and post processed in a separate workstation with concurrent attending physician supervision. Selected images were sent to PACS. No evidence of hemorrhagic complication. A left common femoral artery angiogram was obtained with left anterior oblique views. The puncture is at the common femoral artery which has  adequate caliber for closure device. The femoral sheath was exchanged over the wire for a Perclose pro style which was utilized for access closure. Immediate hemostasis was achieved. 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 Rescue stenting performed with minimal residual stenosis. No hemorrhagic complication postprocedural flat panel head CT. PLAN: Continue on cangrelor drip until head CT is obtained within 3-1/2 hours. At this point, patient may be loaded on ticagrelor and aspirin if CT shows no evidence of hemorrhagic complication. Electronically Signed   By: Pedro Earls M.D.   On: 11/20/2020 13:13   IR CT Head Ltd  Result Date: 11/20/2020 INDICATION: 87 year old female with past medical history significant essential hypertension, severe hearing impairment, breast cancer s/p mastectomy, thoracolumbar scoliosis, and hypothyroidism. She presents with global aphasia, NIHSS 6. Her last known well was 2:18 p.m. on 11/18/2020. Her baseline modified Rankin scale is 1. Head CT showed a small left MCA territory infarct (ASPECTS 8). CT angiogram of the head and neck showed a left M2/MCA occlusion. CT perfusion showed a 5 mL core infarct with 44 mL penumbra. She was taken to our service for a diagnostic cerebral angiogram and mechanical thrombectomy. EXAM: ULTRASOUND-GUIDED VASCULAR ACCESS DIAGNOSTIC CEREBRAL ANGIOGRAM MECHANICAL THROMBECTOMY INTRACRANIAL STENTING FLAT PANEL HEAD CT COMPARISON:  CT/CT angiogram of the head and neck November 19, 2020 MEDICATIONS: Intravenous cangrelor bolus and drip ANESTHESIA/SEDATION: The procedure was performed under general anesthesia. CONTRAST:  130 mL of Omnipaque 240 milligram/mL FLUOROSCOPY TIME:  Fluoroscopy Time: 27 minutes 12 seconds (752 mGy). COMPLICATIONS: None immediate. TECHNIQUE: Informed written consent was obtained from the  patient's niece after a thorough discussion of the procedural risks, benefits and alternatives. All questions were addressed. Maximal Sterile Barrier Technique was utilized including caps, mask, sterile gowns, sterile gloves, sterile drape, hand hygiene and skin antiseptic. A timeout was performed prior to the initiation of the procedure. The left groin was prepped and draped in the usual sterile fashion. Using a micropuncture kit and the modified Seldinger technique, access was gained to the left common femoral artery and an 8 French sheath was placed. Real-time ultrasound guidance was utilized for vascular access  including the acquisition of a permanent ultrasound image documenting patency of the accessed vessel. Under fluoroscopy, an 8 [place] Walrus balloon guide catheter was navigated over a 6 [place] Berenstein 2 catheter and a 0.035" Terumo Glidewire into the aortic arch. The catheter was placed into the left common carotid artery and then advanced into the left internal carotid artery. The inner catheter was removed. Frontal and lateral angiograms of the head were obtained. FINDINGS: 1. Atherosclerotic changes are noted in the left common femoral artery which has adequate caliber for vascular access. 2. Severe stenosis at the distal left M1/MCA with occlusion of the anterior temporal branch which shows mildly delayed retrograde opacification. There is low distal opacification of the remainder of the left MCA vascular tree. PROCEDURE: Under biplane roadmap, a zoom 71 aspiration catheter was navigated over an Aristotle 14 microguidewire into the cavernous segment of the right ICA. The aspiration catheter was then advanced to the level of occlusion/near occlusion and connected to an aspiration pump. Continuous aspiration was performed for 3 minutes. The guide catheter balloon was inflated and the catheter was connected to a VacLok syringe. The aspiration catheter was subsequently removed under constant  aspiration. The guide catheter was aspirated for debris. Left ICA angiograms with frontal and lateral views of the head showed improved anterograde flow the left MCA vascular tree with persistent severe stenosis. Under biplane roadmap, a zoom 71 aspiration catheter was navigated over an Aristotle 14 microguidewire into the cavernous segment of the right ICA. The aspiration catheter was then advanced to the level of occlusion/near occlusion and connected to an aspiration pump. Continuous aspiration was performed for 3 minutes. The guide catheter balloon was inflated and the catheter was connected to a VacLok syringe. The aspiration catheter was subsequently removed under constant aspiration. The guide catheter was aspirated for debris. Left ICA angiograms with frontal and lateral views of the head showed persistent severe stenosis. Patient was loaded on cangrelor and drip was initiated. Under biplane roadmap, a zoom 71 aspiration catheter was navigated over a phenom 21 microcatheter and a synchro support microguidewire into the cavernous segment of the right ICA. The microcatheter was then navigated over the wire into the left M2/MCA posterior division branch. Then, a 4.5 x 30 mm neural form atlas stent was deployed spanning the left M1 and M2/MCA posterior division branch. Left internal carotid artery angiogram showed adequate stent position across the left MCA stenosis with mild residual stenosis. Delayed left internal carotid artery angiograms with frontal and lateral views of the head showed further improvement of the degree of stenosis with improvement of the anterograde flow and no evidence of in stent thrombosis. Flat panel CT of the head was obtained and post processed in a separate workstation with concurrent attending physician supervision. Selected images were sent to PACS. No evidence of hemorrhagic complication. A left common femoral artery angiogram was obtained with left anterior oblique views. The  puncture is at the common femoral artery which has adequate caliber for closure device. The femoral sheath was exchanged over the wire for a Perclose pro style which was utilized for access closure. Immediate hemostasis was achieved. 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 Rescue stenting performed with minimal residual stenosis. No hemorrhagic complication postprocedural flat panel head CT. PLAN: Continue on cangrelor drip until head CT is obtained within 3-1/2 hours. At this point, patient may be loaded on ticagrelor and aspirin if CT shows  no evidence of hemorrhagic complication. Electronically Signed   By: Pedro Earls M.D.   On: 11/20/2020 13:13   IR US Guide Vasc Access Left  Result Date: 11/20/2020 INDICATION: 87 year old female with past medical history significant essential hypertension, severe hearing impairment, breast cancer s/p mastectomy, thoracolumbar scoliosis, and hypothyroidism. She presents with global aphasia, NIHSS 6. Her last known well was 2:18 p.m. on 11/18/2020. Her baseline modified Rankin scale is 1. Head CT showed a small left MCA territory infarct (ASPECTS 8). CT angiogram of the head and neck showed a left M2/MCA occlusion. CT perfusion showed a 5 mL core infarct with 44 mL penumbra. She was taken to our service for a diagnostic cerebral angiogram and mechanical thrombectomy. EXAM: ULTRASOUND-GUIDED VASCULAR ACCESS DIAGNOSTIC CEREBRAL ANGIOGRAM MECHANICAL THROMBECTOMY INTRACRANIAL STENTING FLAT PANEL HEAD CT COMPARISON:  CT/CT angiogram of the head and neck November 19, 2020 MEDICATIONS: Intravenous cangrelor bolus and drip ANESTHESIA/SEDATION: The procedure was performed under general anesthesia. CONTRAST:  130 mL of Omnipaque 240 milligram/mL FLUOROSCOPY TIME:  Fluoroscopy Time: 27 minutes 12 seconds (752 mGy). COMPLICATIONS: None  immediate. TECHNIQUE: Informed written consent was obtained from the patient's niece after a thorough discussion of the procedural risks, benefits and alternatives. All questions were addressed. Maximal Sterile Barrier Technique was utilized including caps, mask, sterile gowns, sterile gloves, sterile drape, hand hygiene and skin antiseptic. A timeout was performed prior to the initiation of the procedure. The left groin was prepped and draped in the usual sterile fashion. Using a micropuncture kit and the modified Seldinger technique, access was gained to the left common femoral artery and an 8 French sheath was placed. Real-time ultrasound guidance was utilized for vascular access including the acquisition of a permanent ultrasound image documenting patency of the accessed vessel. Under fluoroscopy, an 8 [place] Walrus balloon guide catheter was navigated over a 6 [place] Berenstein 2 catheter and a 0.035" Terumo Glidewire into the aortic arch. The catheter was placed into the left common carotid artery and then advanced into the left internal carotid artery. The inner catheter was removed. Frontal and lateral angiograms of the head were obtained. FINDINGS: 1. Atherosclerotic changes are noted in the left common femoral artery which has adequate caliber for vascular access. 2. Severe stenosis at the distal left M1/MCA with occlusion of the anterior temporal branch which shows mildly delayed retrograde opacification. There is low distal opacification of the remainder of the left MCA vascular tree. PROCEDURE: Under biplane roadmap, a zoom 71 aspiration catheter was navigated over an Aristotle 14 microguidewire into the cavernous segment of the right ICA. The aspiration catheter was then advanced to the level of occlusion/near occlusion and connected to an aspiration pump. Continuous aspiration was performed for 3 minutes. The guide catheter balloon was inflated and the catheter was connected to a VacLok syringe. The  aspiration catheter was subsequently removed under constant aspiration. The guide catheter was aspirated for debris. Left ICA angiograms with frontal and lateral views of the head showed improved anterograde flow the left MCA vascular tree with persistent severe stenosis. Under biplane roadmap, a zoom 71 aspiration catheter was navigated over an Aristotle 14 microguidewire into the cavernous segment of the right ICA. The aspiration catheter was then advanced to the level of occlusion/near occlusion and connected to an aspiration pump. Continuous aspiration was performed for 3 minutes. The guide catheter balloon was inflated and the catheter was connected to a VacLok syringe. The aspiration catheter was subsequently removed under constant aspiration. The guide catheter was  aspirated for debris. Left ICA angiograms with frontal and lateral views of the head showed persistent severe stenosis. Patient was loaded on cangrelor and drip was initiated. Under biplane roadmap, a zoom 71 aspiration catheter was navigated over a phenom 21 microcatheter and a synchro support microguidewire into the cavernous segment of the right ICA. The microcatheter was then navigated over the wire into the left M2/MCA posterior division branch. Then, a 4.5 x 30 mm neural form atlas stent was deployed spanning the left M1 and M2/MCA posterior division branch. Left internal carotid artery angiogram showed adequate stent position across the left MCA stenosis with mild residual stenosis. Delayed left internal carotid artery angiograms with frontal and lateral views of the head showed further improvement of the degree of stenosis with improvement of the anterograde flow and no evidence of in stent thrombosis. Flat panel CT of the head was obtained and post processed in a separate workstation with concurrent attending physician supervision. Selected images were sent to PACS. No evidence of hemorrhagic complication. A left common femoral artery  angiogram was obtained with left anterior oblique views. The puncture is at the common femoral artery which has adequate caliber for closure device. The femoral sheath was exchanged over the wire for a Perclose pro style which was utilized for access closure. Immediate hemostasis was achieved. 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 Rescue stenting performed with minimal residual stenosis. No hemorrhagic complication postprocedural flat panel head CT. PLAN: Continue on cangrelor drip until head CT is obtained within 3-1/2 hours. At this point, patient may be loaded on ticagrelor and aspirin if CT shows no evidence of hemorrhagic complication. Electronically Signed   By: Pedro Earls M.D.   On: 11/20/2020 13:13   ECHOCARDIOGRAM COMPLETE  Result Date: 11/20/2020    ECHOCARDIOGRAM REPORT   Patient Name:   CHARLYNE ROBERTSHAW Date of Exam: 11/20/2020 Medical Rec #:  [id number]       Height: Accession #:    [id number]      Weight:       140.7 lb Date of Birth:  [birth year]       BSA:          1.597 m Patient Age:    74 years        BP:           153/116 mmHg Patient Gender: F               HR:           87 bpm. Exam Location:  Inpatient Procedure: 2D Echo, Color Doppler and Cardiac Doppler Indications:    Stroke i63.9  History:        Patient has no prior history of Echocardiogram examinations.                 Risk Factors:Hypertension.  Sonographer:    Raquel Sarna Senior RDCS Referring Phys: [id number] Rikki Spearing  Sonographer Comments: Difficult study, patient is confused, pulling on clothes and wires, exam truncated at the end due to patient pushing probe away. IMPRESSIONS  1. Left ventricular ejection fraction, by estimation, is 60 to 65%. The left ventricle has normal function. The left ventricle has no regional wall motion abnormalities. Left ventricular diastolic parameters  are indeterminate. Elevated left ventricular end-diastolic pressure.  2. Right ventricular systolic function is normal. The right ventricular size is normal.  3. The mitral valve  is grossly normal. Trivial mitral valve regurgitation. Moderate mitral annular calcification.  4. The aortic valve is grossly normal. Aortic valve regurgitation is not visualized. FINDINGS  Left Ventricle: Left ventricular ejection fraction, by estimation, is 60 to 65%. The left ventricle has normal function. The left ventricle has no regional wall motion abnormalities. The left ventricular internal cavity size was normal in size. There is  no left ventricular hypertrophy. Left ventricular diastolic parameters are indeterminate. Elevated left ventricular end-diastolic pressure. Right Ventricle: The right ventricular size is normal. Right vetricular wall thickness was not well visualized. Right ventricular systolic function is normal. Left Atrium: Left atrial size was normal in size. Right Atrium: Right atrial size was normal in size. Pericardium: There is no evidence of pericardial effusion. Mitral Valve: The mitral valve is grossly normal. Moderate mitral annular calcification. Trivial mitral valve regurgitation. Tricuspid Valve: The tricuspid valve is not well visualized. Tricuspid valve regurgitation is not demonstrated. Aortic Valve: The aortic valve is grossly normal. Aortic valve regurgitation is not visualized. Pulmonic Valve: The pulmonic valve was grossly normal. Pulmonic valve regurgitation is not visualized. Aorta: The aortic root and ascending aorta are structurally normal, with no evidence of dilitation. IAS/Shunts: The atrial septum is grossly normal.  LEFT VENTRICLE PLAX 2D LVIDd:         4.70 cm  Diastology LVIDs:         2.60 cm  LV e' medial:    7.62 cm/s LV PW:         0.80 cm  LV E/e' medial:  15.1 LV IVS:        0.50 cm  LV e' lateral:   7.07 cm/s LVOT diam:     1.70 cm  LV E/e' lateral: 16.3 LV SV:         45 LV SV  Index:   28 LVOT Area:     2.27 cm  RIGHT VENTRICLE TAPSE (M-mode): 2.0 cm LEFT ATRIUM             Index       RIGHT ATRIUM           Index LA diam:        3.80 cm 2.38 cm/m  RA Area:     12.90 cm LA Vol (A2C):   34.5 ml 21.60 ml/m RA Volume:   30.50 ml  19.10 ml/m LA Vol (A4C):   46.4 ml 29.06 ml/m LA Biplane Vol: 43.7 ml 27.37 ml/m  AORTIC VALVE LVOT Vmax:   84.20 cm/s LVOT Vmean:  58.900 cm/s LVOT VTI:    0.199 m  AORTA Ao Root diam: 2.60 cm MITRAL VALVE MV Area (PHT): 5.54 cm     SHUNTS MV Decel Time: 137 msec     Systemic VTI:  0.20 m MV E velocity: 115.00 cm/s  Systemic Diam: 1.70 cm MV A velocity: 103.00 cm/s MV E/A ratio:  1.12 Mertie Moores MD Electronically signed by Mertie Moores MD Signature Date/Time: 11/20/2020/11:45:17 AM    Final    IR PERCUTANEOUS ART THROMBECTOMY/INFUSION INTRACRANIAL INC DIAG ANGIO  Result Date: 11/20/2020 INDICATION: 87 year old female with past medical history significant essential hypertension, severe hearing impairment, breast cancer s/p mastectomy, thoracolumbar scoliosis, and hypothyroidism. She presents with global aphasia, NIHSS 6. Her last known well was 2:18 p.m. on 11/18/2020. Her baseline modified Rankin scale is 1. Head CT showed a small left MCA territory infarct (ASPECTS 8). CT angiogram of the head and neck showed a left M2/MCA occlusion. CT perfusion showed a 5  mL core infarct with 44 mL penumbra. She was taken to our service for a diagnostic cerebral angiogram and mechanical thrombectomy. EXAM: ULTRASOUND-GUIDED VASCULAR ACCESS DIAGNOSTIC CEREBRAL ANGIOGRAM MECHANICAL THROMBECTOMY INTRACRANIAL STENTING FLAT PANEL HEAD CT COMPARISON:  CT/CT angiogram of the head and neck November 19, 2020 MEDICATIONS: Intravenous cangrelor bolus and drip ANESTHESIA/SEDATION: The procedure was performed under general anesthesia. CONTRAST:  130 mL of Omnipaque 240 milligram/mL FLUOROSCOPY TIME:  Fluoroscopy Time: 27 minutes 12 seconds (752 mGy). COMPLICATIONS: None  immediate. TECHNIQUE: Informed written consent was obtained from the patient's niece after a thorough discussion of the procedural risks, benefits and alternatives. All questions were addressed. Maximal Sterile Barrier Technique was utilized including caps, mask, sterile gowns, sterile gloves, sterile drape, hand hygiene and skin antiseptic. A timeout was performed prior to the initiation of the procedure. The left groin was prepped and draped in the usual sterile fashion. Using a micropuncture kit and the modified Seldinger technique, access was gained to the left common femoral artery and an 8 French sheath was placed. Real-time ultrasound guidance was utilized for vascular access including the acquisition of a permanent ultrasound image documenting patency of the accessed vessel. Under fluoroscopy, an 8 [place] Walrus balloon guide catheter was navigated over a 6 [place] Berenstein 2 catheter and a 0.035" Terumo Glidewire into the aortic arch. The catheter was placed into the left common carotid artery and then advanced into the left internal carotid artery. The inner catheter was removed. Frontal and lateral angiograms of the head were obtained. FINDINGS: 1. Atherosclerotic changes are noted in the left common femoral artery which has adequate caliber for vascular access. 2. Severe stenosis at the distal left M1/MCA with occlusion of the anterior temporal branch which shows mildly delayed retrograde opacification. There is low distal opacification of the remainder of the left MCA vascular tree. PROCEDURE: Under biplane roadmap, a zoom 71 aspiration catheter was navigated over an Aristotle 14 microguidewire into the cavernous segment of the right ICA. The aspiration catheter was then advanced to the level of occlusion/near occlusion and connected to an aspiration pump. Continuous aspiration was performed for 3 minutes. The guide catheter balloon was inflated and the catheter was connected to a VacLok syringe. The  aspiration catheter was subsequently removed under constant aspiration. The guide catheter was aspirated for debris. Left ICA angiograms with frontal and lateral views of the head showed improved anterograde flow the left MCA vascular tree with persistent severe stenosis. Under biplane roadmap, a zoom 71 aspiration catheter was navigated over an Aristotle 14 microguidewire into the cavernous segment of the right ICA. The aspiration catheter was then advanced to the level of occlusion/near occlusion and connected to an aspiration pump. Continuous aspiration was performed for 3 minutes. The guide catheter balloon was inflated and the catheter was connected to a VacLok syringe. The aspiration catheter was subsequently removed under constant aspiration. The guide catheter was aspirated for debris. Left ICA angiograms with frontal and lateral views of the head showed persistent severe stenosis. Patient was loaded on cangrelor and drip was initiated. Under biplane roadmap, a zoom 71 aspiration catheter was navigated over a phenom 21 microcatheter and a synchro support microguidewire into the cavernous segment of the right ICA. The microcatheter was then navigated over the wire into the left M2/MCA posterior division branch. Then, a 4.5 x 30 mm neural form atlas stent was deployed spanning the left M1 and M2/MCA posterior division branch. Left internal carotid artery angiogram showed adequate stent position across the left MCA stenosis  with mild residual stenosis. Delayed left internal carotid artery angiograms with frontal and lateral views of the head showed further improvement of the degree of stenosis with improvement of the anterograde flow and no evidence of in stent thrombosis. Flat panel CT of the head was obtained and post processed in a separate workstation with concurrent attending physician supervision. Selected images were sent to PACS. No evidence of hemorrhagic complication. A left common femoral artery  angiogram was obtained with left anterior oblique views. The puncture is at the common femoral artery which has adequate caliber for closure device. The femoral sheath was exchanged over the wire for a Perclose pro style which was utilized for access closure. Immediate hemostasis was achieved. 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 Rescue stenting performed with minimal residual stenosis. No hemorrhagic complication postprocedural flat panel head CT. PLAN: Continue on cangrelor drip until head CT is obtained within 3-1/2 hours. At this point, patient may be loaded on ticagrelor and aspirin if CT shows no evidence of hemorrhagic complication. Electronically Signed   By: Pedro Earls M.D.   On: 11/20/2020 13:13   VAS [place] LOWER EXTREMITY VENOUS (DVT)  Result Date: 11/20/2020  Lower Venous DVT Study Patient Name:  KANIA REGNIER  Date of Exam:   11/20/2020 Medical Rec #: [id number]        Accession #:    [id number] Date of Birth: 03-29-1931        Patient Gender: F Patient Age:   39 years Exam Location:  [facility] Procedure:      VAS [place] LOWER EXTREMITY VENOUS (DVT) Referring Phys: Cornelius Moras XU --------------------------------------------------------------------------------  Indications: Embolic stroke.  Limitations: Patient guarding, intolerant to probe pressure, size and depth of vessels. Comparison Study: No prior studies. Performing Technologist: Darlin Coco RDMS, RVT  Examination Guidelines: A complete evaluation includes B-mode imaging, spectral Doppler, color Doppler, and power Doppler as needed of all accessible portions of each vessel. Bilateral testing is considered an integral part of a complete examination. Limited examinations for reoccurring indications may be performed as noted. The reflux portion of the exam is performed with the patient in reverse  Trendelenburg.  +---------+---------------+---------+-----------+----------+---------------+ RIGHT    CompressibilityPhasicitySpontaneityPropertiesThrombus Aging  +---------+---------------+---------+-----------+----------+---------------+ CFV      Full           Yes      Yes                                  +---------+---------------+---------+-----------+----------+---------------+ SFJ      Full                                                         +---------+---------------+---------+-----------+----------+---------------+ FV Prox  Full                                                         +---------+---------------+---------+-----------+----------+---------------+ FV Mid   Full                                                         +---------+---------------+---------+-----------+----------+---------------+  FV DistalFull                                                         +---------+---------------+---------+-----------+----------+---------------+ PFV      Full                                                         +---------+---------------+---------+-----------+----------+---------------+ POP      Full           Yes      Yes                                  +---------+---------------+---------+-----------+----------+---------------+ PTV                                                   Patent by color +---------+---------------+---------+-----------+----------+---------------+ PERO                                                  Patent by color +---------+---------------+---------+-----------+----------+---------------+   +---------+---------------+---------+-----------+----------+-------------------+ LEFT     CompressibilityPhasicitySpontaneityPropertiesThrombus Aging      +---------+---------------+---------+-----------+----------+-------------------+ CFV                     Yes      Yes                                       +---------+---------------+---------+-----------+----------+-------------------+ SFJ      Full                                                             +---------+---------------+---------+-----------+----------+-------------------+ FV Prox  Full                                                             +---------+---------------+---------+-----------+----------+-------------------+ FV Mid   Full                                                             +---------+---------------+---------+-----------+----------+-------------------+ FV Distal  Unable to visualize                                                       due to patient                                                            guarding            +---------+---------------+---------+-----------+----------+-------------------+ PFV      Full                                                             +---------+---------------+---------+-----------+----------+-------------------+ POP      Full                                                             +---------+---------------+---------+-----------+----------+-------------------+ PTV      Full           Yes      Yes                                      +---------+---------------+---------+-----------+----------+-------------------+ PERO                    Yes      Yes                  Patent by color     +---------+---------------+---------+-----------+----------+-------------------+     Summary: RIGHT: - There is no evidence of deep vein thrombosis in the lower extremity. However, portions of this examination were limited- see technologist comments above.  - No cystic structure found in the popliteal fossa.  LEFT: - There is no evidence of deep vein thrombosis in the lower extremity. However, portions of this examination were limited- see technologist comments above.  - No  cystic structure found in the popliteal fossa.  *See table(s) above for measurements and observations. Electronically signed by Harold Barban MD on 11/20/2020 at 8:08:30 PM.    Final    VAS [place] TRANSCRANIAL DOPPLER  Result Date: 11/22/2020  Transcranial Doppler Patient Name:  RUIE SENDEJO  Date of Exam:   11/22/2020 Medical Rec #: [id number]        Accession #:    [id number] Date of Birth: 02/02/1931        Patient Gender: F Patient Age:   18 years Exam Location:  [facility] Procedure:      VAS [place] TRANSCRANIAL DOPPLER Referring Phys: PRAMOD SETHI --------------------------------------------------------------------------------  Indications: Stroke. Limitations for diagnostic windows: Unable to insonate right transtemporal window. Unable to insonate left transtemporal window. Comparison Study: No prior studies. Performing Technologist: Darlin Coco RDMS, RVT  Examination Guidelines:  A complete evaluation includes B-mode imaging, spectral Doppler, color Doppler, and power Doppler as needed of all accessible portions of each vessel. Bilateral testing is considered an integral part of a complete examination. Limited examinations for reoccurring indications may be performed as noted.  +----------+-------------+----------+-----------+------------------+ RIGHT TCD Right VM (cm)Depth (cm)Pulsatility     Comment       +----------+-------------+----------+-----------+------------------+ MCA                                         Unable to insonate +----------+-------------+----------+-----------+------------------+ ACA                                         Unable to insonate +----------+-------------+----------+-----------+------------------+ Term ICA                                    Unable to insonate +----------+-------------+----------+-----------+------------------+ PCA                                         Unable to insonate  +----------+-------------+----------+-----------+------------------+ Opthalmic     22.00                 1.61                       +----------+-------------+----------+-----------+------------------+ ICA siphon                                  Unable to insonate +----------+-------------+----------+-----------+------------------+ Vertebral    -11.00                 0.59                       +----------+-------------+----------+-----------+------------------+  +----------+------------+----------+-----------+-------------------------------+ LEFT TCD  Left VM (cm)Depth (cm)Pulsatility            Comment             +----------+------------+----------+-----------+-------------------------------+ MCA                                              Unable to insonate        +----------+------------+----------+-----------+-------------------------------+ ACA                                         Unable to insonate Unable to                                                         insonate             +----------+------------+----------+-----------+-------------------------------+ Term ICA  Unable to insonate        +----------+------------+----------+-----------+-------------------------------+ PCA                                              Unable to insonate        +----------+------------+----------+-----------+-------------------------------+ Opthalmic    22.00                 1.60                                    +----------+------------+----------+-----------+-------------------------------+ ICA siphon                                       Unable to insonate        +----------+------------+----------+-----------+-------------------------------+ Vertebral    -12.00                1.03                                    +----------+------------+----------+-----------+-------------------------------+   +------------+------+-------+             VM cm Comment +------------+------+-------+ Prox Basilar-15.00        +------------+------+-------+ Summary:  Highly limited study due to poor windows throughout. Antegrade flow noted in both opthalmics, vertebrals and basilar arteries. *See table(s) above for TCD measurements and observations.  Diagnosing physician: Antony Contras MD Electronically signed by Antony Contras MD on 11/22/2020 at 3:21:35 PM.    Final    CT ANGIO HEAD NECK W WO CM W PERF (CODE STROKE)  Result Date: 11/19/2020 CLINICAL DATA:  Neuro deficit, acute, stroke suspected EXAM: CT ANGIOGRAPHY HEAD AND NECK CT PERFUSION BRAIN TECHNIQUE: Multidetector CT imaging of the head and neck was performed using the standard protocol during bolus administration of intravenous contrast. Multiplanar CT image reconstructions and MIPs were obtained to evaluate the vascular anatomy. Carotid stenosis measurements (when applicable) are obtained utilizing NASCET criteria, using the distal internal carotid diameter as the denominator. Multiphase CT imaging of the brain was performed following IV bolus contrast injection. Subsequent parametric perfusion maps were calculated using RAPID software. CONTRAST:  142m OMNIPAQUE IOHEXOL 350 MG/ML SOLN COMPARISON:  None. FINDINGS: CT HEAD Brain: There is no acute intracranial hemorrhage, mass effect, or edema. There is loss of gray-white differentiation along the inferior insula and likely adjacent temporal lobe. There is no extra-axial fluid collection. Prominence of the ventricles and sulci reflects generalized parenchymal volume loss. Patchy low-attenuation in the supratentorial white matter is nonspecific but may reflect chronic microvascular ischemic changes. There is a small chronic left cerebellar infarct. Vascular: Focal hyperdensity is present along the left M1 MCA. Skull: Calvarium is unremarkable. Sinuses/Orbits: No acute finding. Other: None. Review of the MIP  images confirms the above findings CTA NECK Aortic arch: Mild mixed plaque is present along the aortic arch. There is irregular noncalcified plaque along the superior margin beyond the left subclavian origin. Right carotid system: Patent. Mild calcified plaque at the bifurcation. No stenosis. Left carotid system: Patent. Mild calcified plaque at the bifurcation and proximal internal carotid. No stenosis. Vertebral arteries: Patent. Left vertebral artery slightly dominant. No stenosis. Skeleton: Cervical  spine degenerative changes. Vertebral body hemangioma at T1. Other neck: Unremarkable. Upper chest: No apical lung mass. Review of the MIP images confirms the above findings CTA HEAD Anterior circulation: Intracranial internal carotid arteries are patent with mild calcified plaque but no significant stenosis. Proximal left M1 MCA is patent. There is early bifurcation of the MCA with distal M1 proximal M2 occlusion. Partial reconstitution. There is high-grade stenosis or nonocclusive thrombus within anterior temporal branch approximately 5 mm beyond its origin arising just proximal to the occlusion. Right middle and both anterior cerebral arteries are patent. Posterior circulation: Intracranial vertebral arteries are patent with mixed plaque causing mild stenosis. Basilar artery is patent. Major cerebellar artery origins are patent. Bilateral posterior communicating arteries are present with fetal origin of both patent posterior cerebral arteries. Venous sinuses: Patent as allowed by contrast bolus timing. Review of the MIP images confirms the above findings CT Brain Perfusion Findings: CBF (<30%) Volume: 80m Perfusion (Tmax>6.0s) volume: 450mMismatch Volume: 3923mnfarction Location: Left MCA territory IMPRESSION: No acute intracranial hemorrhage. Small acute left MCA territory infarction involving inferior insula and adjacent temporal lobe (ASPECT score is 8). Early bifurcation of the left MCA with distal M1 and  proximal M2 occlusion and subsequent partial reconstitution. High-grade stenosis or nonocclusive thrombus within anterior temporal branch approximately 5 mm beyond its origin just proximal to the occlusion. Perfusion imaging demonstrates 5 mL of core infarction partially corresponding to noncontrast CT abnormality. There is a calculated penumbra of 39 mL in the left MCA territory. No occlusion or hemodynamically significant stenosis in the neck. Initial results were communicated to Dr. [place]ly [place] 3:11 pm on 11/19/2020 by text page via the [facility] system. Electronically Signed   By: PraMacy MisD.   On: 11/19/2020 15:24    Labs: BNP (last 3 results) No results for input(s): BNP in the last 8760 hours. Basic Metabolic Panel: Recent Labs  Lab 12/16/20 2324 12/16/20 2339 12/17/20 0415  NA 136 137 137  K 4.3 4.3 4.1  CL 103 106 104  CO2 18*  --  22  GLUCOSE 110* 108* 82  BUN 34* 38* 31*  CREATININE 2.29* 2.20* 1.83*  CALCIUM 9.3  --  8.9   Liver Function Tests: Recent Labs  Lab 12/16/20 2324  AST 31  ALT 20  ALKPHOS 57  BILITOT 0.4  PROT 7.3  ALBUMIN 3.4*   No results for input(s): LIPASE, AMYLASE in the last 168 hours. No results for input(s): AMMONIA in the last 168 hours. CBC: Recent Labs  Lab 12/16/20 2324 12/16/20 2339 12/17/20 0415 12/17/20 0945  WBC 8.1  --  6.2  --   NEUTROABS 5.9  --   --   --   HGB 10.8* 11.2* 9.3* 9.3*  HCT 34.1* 33.0* 29.6* 29.6*  MCV 100.6*  --  100.3*  --   PLT 448*  --  345  --    Cardiac Enzymes: No results for input(s): CKTOTAL, CKMB, CKMBINDEX, TROPONINI in the last 168 hours. BNP: Invalid input(s): POCBNP CBG: No results for input(s): GLUCAP in the last 168 hours. D-Dimer No results for input(s): DDIMER in the last 72 hours. Hgb A1c No results for input(s): HGBA1C in the last 72 hours. Lipid Profile No results for input(s): CHOL, HDL, LDLCALC, TRIG, CHOLHDL, LDLDIRECT in the last 72 hours. Thyroid function  studies No results for input(s): TSH, T4TOTAL, T3FREE, THYROIDAB in the last 72 hours.  Invalid input(s): FREET3 Anemia work up No results for input(s): VITAMINB12, FOLATE, FERRITIN,  TIBC, IRON, RETICCTPCT in the last 72 hours. Urinalysis    Component Value Date/Time   COLORURINE YELLOW 12/17/2020 0945   APPEARANCEUR CLEAR 12/17/2020 0945   LABSPEC 1.015 12/17/2020 0945   PHURINE 6.0 12/17/2020 0945   GLUCOSEU NEGATIVE 12/17/2020 0945   HGBUR SMALL (A) 12/17/2020 0945   BILIRUBINUR NEGATIVE 12/17/2020 0945   KETONESUR NEGATIVE 12/17/2020 0945   PROTEINUR NEGATIVE 12/17/2020 0945   NITRITE NEGATIVE 12/17/2020 0945   LEUKOCYTESUR NEGATIVE 12/17/2020 0945   Sepsis Labs Invalid input(s): PROCALCITONIN,  WBC,  LACTICIDVEN Microbiology Recent Results (from the past 240 hour(s))  SARS CORONAVIRUS 2 (TAT 6-24 HRS) Nasopharyngeal Nasopharyngeal Swab     Status: None   Collection Time: 12/11/20  8:20 AM   Specimen: Nasopharyngeal Swab  Result Value Ref Range Status   SARS Coronavirus 2 NEGATIVE NEGATIVE Final    Comment: (NOTE) SARS-CoV-2 target nucleic acids are NOT DETECTED.  The SARS-CoV-2 RNA is generally detectable in upper and lower respiratory specimens during the acute phase of infection. Negative results do not preclude SARS-CoV-2 infection, do not rule out co-infections with other pathogens, and should not be used as the sole basis for treatment or other patient management decisions. Negative results must be combined with clinical observations, patient history, and epidemiological information. The expected result is Negative.  Fact Sheet for Patients: SugarRoll.be  Fact Sheet for Healthcare Providers: https://www.woods-mathews.com/  This test is not yet approved or cleared by the [place] FDA and  has been authorized for detection and/or diagnosis of SARS-CoV-2 by FDA under an Emergency Use Authorization (EUA). This EUA  will remain  in effect (meaning this test can be used) for the duration of the COVID-19 declaration under Se ction 564(b)(1) of the Act, 21 U.S.C. section 360bbb-3(b)(1), unless the authorization is terminated or revoked sooner.  Performed at [facility], [place] [address], [place], [place] [address]   Resp Panel by RT-PCR (Flu A&B, Covid) Nasopharyngeal Swab     Status: None   Collection Time: 12/17/20  2:09 AM   Specimen: Nasopharyngeal Swab; Nasopharyngeal(NP) swabs in vial transport medium  Result Value Ref Range Status   SARS Coronavirus 2 by RT PCR NEGATIVE NEGATIVE Final    Comment: (NOTE) SARS-CoV-2 target nucleic acids are NOT DETECTED.  The SARS-CoV-2 RNA is generally detectable in upper respiratory specimens during the acute phase of infection. The lowest concentration of SARS-CoV-2 viral copies this assay can detect is 138 copies/mL. A negative result does not preclude SARS-Cov-2 infection and should not be used as the sole basis for treatment or other patient management decisions. A negative result may occur with  improper specimen collection/handling, submission of specimen other than nasopharyngeal swab, presence of viral mutation(s) within the areas targeted by this assay, and inadequate number of viral copies(<138 copies/mL). A negative result must be combined with clinical observations, patient history, and epidemiological information. The expected result is Negative.  Fact Sheet for Patients:  EntrepreneurPulse.com.au  Fact Sheet for Healthcare Providers:  IncredibleEmployment.be  This test is no t yet approved or cleared by the [place] FDA and  has been authorized for detection and/or diagnosis of SARS-CoV-2 by FDA under an Emergency Use Authorization (EUA). This EUA will remain  in effect (meaning this test can be used) for the duration of the COVID-19 declaration under Section 564(b)(1) of the Act,  21 U.S.C.section 360bbb-3(b)(1), unless the authorization is terminated  or revoked sooner.       Influenza A by PCR NEGATIVE NEGATIVE Final  Influenza B by PCR NEGATIVE NEGATIVE Final    Comment: (NOTE) The Xpert Xpress SARS-CoV-2/FLU/RSV plus assay is intended as an aid in the diagnosis of influenza from Nasopharyngeal swab specimens and should not be used as a sole basis for treatment. Nasal washings and aspirates are unacceptable for Xpert Xpress SARS-CoV-2/FLU/RSV testing.  Fact Sheet for Patients: EntrepreneurPulse.com.au  Fact Sheet for Healthcare Providers: IncredibleEmployment.be  This test is not yet approved or cleared by the [place] FDA and has been authorized for detection and/or diagnosis of SARS-CoV-2 by FDA under an Emergency Use Authorization (EUA). This EUA will remain in effect (meaning this test can be used) for the duration of the COVID-19 declaration under Section 564(b)(1) of the Act, 21 U.S.C. section 360bbb-3(b)(1), unless the authorization is terminated or revoked.  Performed at [facility], [place] [address], [place], [place] [address]      Time coordinating discharge: 35 minutes  SIGNED: Antonieta Pert, MD  [facility] 12/18/2020, 2:32 PM  If 7PM-7AM, please contact night-coverage [email or web address]

## 2020-12-18 NOTE — [place] FL2 (Signed)
[place] MEDICAID FL2 LEVEL OF CARE SCREENING TOOL     IDENTIFICATION  Patient Name: Maria Dyer Birthdate: [birth year] Sex: female Admission Date (Current Location): 12/16/2020  [facility] and [place] Number:  Producer, television/film/video and Address:  [place]. [facility], [address] [place], [place] [address]      Provider Number: [id number]  Attending Physician Name and Address:  Lanae Boast, MD  Relative Name and Phone Number:       Current Level of Care: Hospital Recommended Level of Care: Assisted Living Facility (With Hospice care) Prior Approval Number:    Date Approved/Denied:   PASRR Number: [id number]  Discharge Plan: Other (Comment) (Assisted Living with Hospice)    Current Diagnoses: Patient Active Problem List   Diagnosis Date Noted   BRBPR (bright red blood per rectum) 12/17/2020   AKI (acute kidney injury) (HCC) 12/17/2020   ABLA (acute blood loss anemia) 12/17/2020   GI bleed 12/17/2020   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   Acute ischemic left MCA stroke (HCC) 11/19/2020   Pressure ulcer 11/19/2020    Orientation RESPIRATION BLADDER Height & Weight     Self  Normal Incontinent Weight: 63.5 kg Height:  5' (152.4 cm)  BEHAVIORAL SYMPTOMS/MOOD NEUROLOGICAL BOWEL NUTRITION STATUS      Continent Diet  AMBULATORY STATUS COMMUNICATION OF NEEDS Skin   Extensive Assist  (Expressive aphagia) Normal                       Personal Care Assistance Level of Assistance  Bathing, Feeding, Dressing, Total care Bathing Assistance: Limited assistance Feeding assistance: Limited assistance (Setup) Dressing Assistance: Limited assistance Total Care Assistance: Limited assistance   Functional Limitations Info  Sight, Hearing, Speech Sight Info: Adequate Hearing Info: Impaired Speech Info: Impaired    SPECIAL CARE FACTORS FREQUENCY   (Hospice care)                    Contractures Contractures Info: Not present     Additional Factors Info  Code Status, Allergies Code Status Info: Full code Allergies Info: Fosinopril, Lisinopril, Enalapril           Current Medications (12/18/2020):  This is the current hospital active medication list Current Facility-Administered Medications  Medication Dose Route Frequency Provider Last Rate Last Admin   acetaminophen (TYLENOL) tablet 650 mg  650 mg Oral Q6H PRN Hillary Bow, DO       Or   acetaminophen (TYLENOL) suppository 650 mg  650 mg Rectal Q6H PRN Hillary Bow, DO       amLODipine (NORVASC) tablet 5 mg  5 mg Oral Daily Julian Reil, Jared M, DO   5 mg at 12/18/20 1039   antiseptic oral rinse (BIOTENE) solution 15 mL  15 mL Topical PRN Haskel Khan, NP       aspirin EC tablet 81 mg  81 mg Oral Daily Lyda Perone M, DO   81 mg at 12/18/20 1039   glycopyrrolate (ROBINUL) tablet 1 mg  1 mg Oral Q4H PRN Haskel Khan, NP       Or   glycopyrrolate (ROBINUL) injection 0.2 mg  0.2 mg Subcutaneous Q4H PRN Haskel Khan, NP       Or   glycopyrrolate (ROBINUL) injection 0.2 mg  0.2 mg Intravenous Q4H PRN Haskel Khan, NP       haloperidol (HALDOL) tablet 0.5 mg  0.5 mg Oral Q4H PRN Katrinka Blazing,  Ricki Miller, NP       Or   haloperidol (HALDOL) 2 MG/ML solution 0.5 mg  0.5 mg Sublingual Q4H PRN Haskel Khan, NP       Or   haloperidol lactate (HALDOL) injection 0.5 mg  0.5 mg Intravenous Q4H PRN Haskel Khan, NP       LORazepam (ATIVAN) tablet 1 mg  1 mg Oral Q1H PRN Haskel Khan, NP       Or   LORazepam (ATIVAN) 2 MG/ML concentrated solution 1 mg  1 mg Sublingual Q1H PRN Haskel Khan, NP       Or   LORazepam (ATIVAN) injection 1 mg  1 mg Intravenous Q1H PRN Haskel Khan, NP       metoprolol succinate (TOPROL-XL) 24 hr tablet 50 mg  50 mg Oral Daily Lyda Perone M, DO   50 mg at 12/18/20 1039   ondansetron (ZOFRAN) tablet 4 mg  4 mg Oral Q6H PRN Hillary Bow, DO       Or   ondansetron [facility]) injection 4 mg  4 mg Intravenous Q6H PRN Hillary Bow, DO       oxyCODONE (ROXICODONE INTENSOL) 20 MG/ML concentrated solution 5-10 mg  5-10 mg Oral Q2H PRN Haskel Khan, NP       Or   oxyCODONE (ROXICODONE INTENSOL) 20 MG/ML concentrated solution 5-10 mg  5-10 mg Sublingual Q2H PRN Haskel Khan, NP       polyvinyl alcohol (LIQUIFILM TEARS) 1.4 % ophthalmic solution 1 drop  1 drop Both Eyes QID PRN Haskel Khan, NP       ticagrelor [facility]) tablet 90 mg  90 mg Oral BID Lyda Perone M, DO   90 mg at 12/18/20 1039   Zinc Oxide (TRIPLE PASTE) 12.8 % ointment   Topical TID Calvert Cantor, MD   Given at 12/18/20 1042     Discharge Medications: Please see discharge summary for a list of discharge medications.  Relevant Imaging Results:  Relevant Lab Results:   Additional Information SSN# [id number], Covid vaccines  Tom-Johnson, Hershal Coria, RN

## 2020-12-18 NOTE — Consult Note (Signed)
WOC Nurse Consult Note: Patient receiving care in [facility] 5M05. Patient able to turn self for sacral area assessment. Reason for Consult: pressure ulcer on sacrum Wound type: NO pressure injury found to sacrum or coccyx Pressure Injury POA: Yes/No/NA Measurement: Wound bed: Drainage (amount, consistency, odor)  Periwound: Dressing procedure/placement/frequency: Encourage the patient to turn and reposition.  Use available skin barrier ointment (Sween 24 in the pink and white tube in clean utility) if the patient is incontinent.  Thank you for the consult.  WOC nurse will not follow at this time.  Please re-consult the WOC team if needed.  Helmut Muster, RN, MSN, CWOCN, CNS-BC, pager [phone]

## 2020-12-18 NOTE — TOC Transition Note (Addendum)
Transition of Care [facility]) - CM/SW Discharge Note   Patient Details  Name: Maria Dyer MRN: [id number] Date of Birth: 04-27-1930  [facility]) CM/SW Contact:  Maria Dyer, Maria Coria, RN Phone Number: 12/18/2020, 3:11 PM   Clinical Narrative:    CM spoke lengthily with niece, Maria Dyer and her husband Maria Dyer at bedside. Patient oriented to self at this time. Maria Dyer states patient moved in to [facility] assisted Living last Thursday due to recent stroke four weeks ago. States patient was very active and independent with care, using a walker to ambulate with care prior to stroke. Patient does not have children but niece, Maria Dyer and her family are very supportive with her care. Maria Dyer is patient's POA. Family requesting patient go back to [facility] with Hospice. CM contacted Maria Dyer with [facility] [phone]) and she transferred call to Maria Dyer, Occupational hygienist. Maria Dyer stated that patient will have to be screened and assessed by Hospice in the hospital before they could accept her back. CM contacted Maria Dyer with [facility] [phone]) and she came and assessed patient with Maria Dyer and Maria Dyer at bedside. Maria Dyer states patient does not need any equipments or other needs. Just want patient to go back to Assisted Living. Maria Dyer to call [facility] with assessment for discharge.  17:05- Received a call from Maria Dyer [facility] with [facility] [phone]) and she requests discharge summary and current FL2. Documents faxed to [phone] and she acknowledged receipt. Maria Dyer, patient's niece notified that patient is good to go back to [facility]. No further needs at this time.    Final next level of care: Assisted Living ([facility] assisted [facility] with hospice.) Barriers to Discharge: No Barriers Identified   Patient Goals and CMS Choice Patient states their goals for this hospitalization and ongoing recovery are:: To go back to Assisted Living. CMS Medicare.gov Compare  Post Acute Care list provided to:: Patient Choice offered to / list presented to : Patient, Adult Children (Niece, Maria Dyer)  Discharge Placement              Patient chooses bed at: Other - please specify in the comment section below: ([facility].) Patient to be transferred to facility by: Niece, Maria Dyer Name of family member notified: Maria Dyer Patient and family notified of of transfer: 12/18/20  Discharge Plan and Services                DME Arranged: N/A DME Agency: NA       HH Arranged: NA HH Agency: Other - See comment ([facility] with Hospice care.)        Social Determinants of Health (SDOH) Interventions     Readmission Risk Interventions No flowsheet data found.

## 2020-12-20 DIAGNOSIS — R69 Illness, unspecified: Secondary | ICD-10-CM | POA: Diagnosis not present

## 2020-12-23 DIAGNOSIS — L89311 Pressure ulcer of right buttock, stage 1: Secondary | ICD-10-CM | POA: Diagnosis not present

## 2020-12-23 DIAGNOSIS — K625 Hemorrhage of anus and rectum: Secondary | ICD-10-CM | POA: Diagnosis not present

## 2020-12-25 ENCOUNTER — Other Ambulatory Visit (HOSPITAL_COMMUNITY): Payer: Self-pay

## 2020-12-25 ENCOUNTER — Telehealth (HOSPITAL_COMMUNITY): Payer: Self-pay

## 2020-12-25 DIAGNOSIS — E782 Mixed hyperlipidemia: Secondary | ICD-10-CM | POA: Diagnosis not present

## 2020-12-25 DIAGNOSIS — I10 Essential (primary) hypertension: Secondary | ICD-10-CM | POA: Diagnosis not present

## 2020-12-25 DIAGNOSIS — E039 Hypothyroidism, unspecified: Secondary | ICD-10-CM | POA: Diagnosis not present

## 2020-12-25 NOTE — Telephone Encounter (Signed)
Transitions of Care Pharmacy  ° °Call attempted for a pharmacy transitions of care follow-up. HIPAA appropriate voicemail was left with call back information provided.  ° °Call attempt #1. Will follow-up in 2-3 days.  °  °

## 2021-01-01 ENCOUNTER — Other Ambulatory Visit (HOSPITAL_COMMUNITY): Payer: Self-pay

## 2021-01-02 ENCOUNTER — Other Ambulatory Visit (HOSPITAL_COMMUNITY): Payer: Self-pay

## 2021-01-03 ENCOUNTER — Encounter: Payer: Medicare HMO | Admitting: Physical Medicine and Rehabilitation

## 2021-01-13 DIAGNOSIS — E039 Hypothyroidism, unspecified: Secondary | ICD-10-CM | POA: Diagnosis not present

## 2021-01-13 DIAGNOSIS — I10 Essential (primary) hypertension: Secondary | ICD-10-CM | POA: Diagnosis not present

## 2021-01-13 DIAGNOSIS — E782 Mixed hyperlipidemia: Secondary | ICD-10-CM | POA: Diagnosis not present

## 2021-01-24 DIAGNOSIS — I69398 Other sequelae of cerebral infarction: Secondary | ICD-10-CM | POA: Diagnosis not present

## 2021-01-24 DIAGNOSIS — F01B0 Vascular dementia, moderate, without behavioral disturbance, psychotic disturbance, mood disturbance, and anxiety: Secondary | ICD-10-CM | POA: Diagnosis not present

## 2021-01-24 DIAGNOSIS — R69 Illness, unspecified: Secondary | ICD-10-CM | POA: Diagnosis not present

## 2021-02-28 ENCOUNTER — Other Ambulatory Visit: Payer: Self-pay

## 2021-02-28 NOTE — Patient Outreach (Signed)
[facility]) Care Management  02/28/2021  Maria Dyer 06-16-1930 [id number]   Telephone outreach to patient to obtain mRS was successfully completed. MRS= 5   Vanice Sarah [facility] Care Management Assistant

## 2022-05-02 IMAGING — DX DG CHEST 1V
1 series · 1 of 1 positions shown · non-contrast
Comparison: None.

CLINICAL DATA: Aspiration into airway.

EXAM:
CHEST  1 VIEW

[chest]
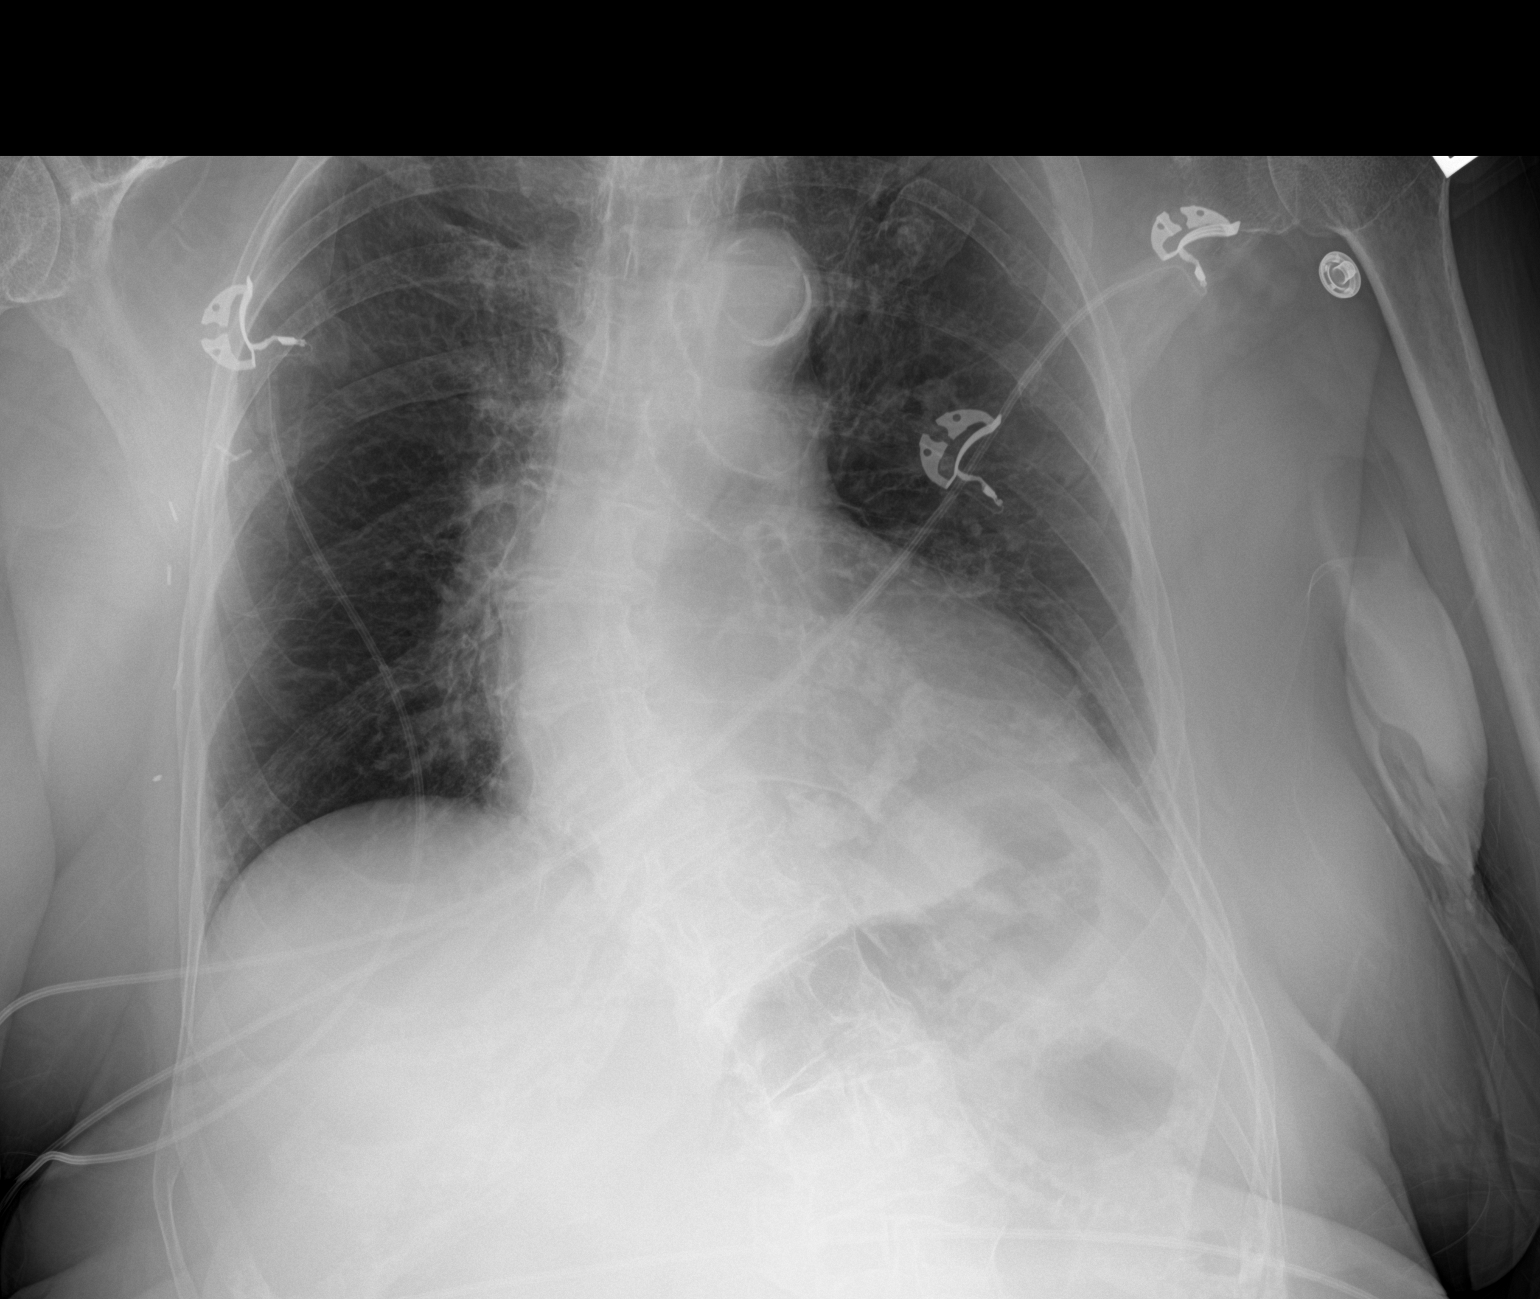

[1 of 1 positions shown; findings below may reference images not displayed]

FINDINGS: Scoliotic curvature of the thoracolumbar spine. No pneumothorax.
Healed left rib fractures. The heart, hila, and mediastinum are
unremarkable with no acute abnormalities. Calcified atherosclerosis
is seen in the thoracic aorta. No pulmonary nodules or masses. No
focal infiltrates. No acute abnormalities.
IMPRESSION: No active disease.

## 2022-05-04 DIAGNOSIS — Z8673 Personal history of transient ischemic attack (TIA), and cerebral infarction without residual deficits: Secondary | ICD-10-CM | POA: Diagnosis not present

## 2022-05-04 DIAGNOSIS — I119 Hypertensive heart disease without heart failure: Secondary | ICD-10-CM | POA: Diagnosis not present

## 2022-05-04 DIAGNOSIS — F0390 Unspecified dementia without behavioral disturbance: Secondary | ICD-10-CM | POA: Diagnosis not present

## 2022-06-29 DIAGNOSIS — F0390 Unspecified dementia without behavioral disturbance: Secondary | ICD-10-CM | POA: Diagnosis not present

## 2022-06-29 DIAGNOSIS — I119 Hypertensive heart disease without heart failure: Secondary | ICD-10-CM | POA: Diagnosis not present

## 2022-06-29 DIAGNOSIS — F064 Anxiety disorder due to known physiological condition: Secondary | ICD-10-CM | POA: Diagnosis not present

## 2022-06-29 DIAGNOSIS — E559 Vitamin D deficiency, unspecified: Secondary | ICD-10-CM | POA: Diagnosis not present

## 2023-05-11 DIAGNOSIS — E785 Hyperlipidemia, unspecified: Secondary | ICD-10-CM | POA: Diagnosis not present

## 2023-05-11 DIAGNOSIS — R7303 Prediabetes: Secondary | ICD-10-CM | POA: Diagnosis not present

## 2023-05-11 DIAGNOSIS — E039 Hypothyroidism, unspecified: Secondary | ICD-10-CM | POA: Diagnosis not present

## 2023-05-11 DIAGNOSIS — I10 Essential (primary) hypertension: Secondary | ICD-10-CM | POA: Diagnosis not present

## 2023-06-10 DIAGNOSIS — N182 Chronic kidney disease, stage 2 (mild): Secondary | ICD-10-CM | POA: Diagnosis not present

## 2023-06-10 DIAGNOSIS — L89321 Pressure ulcer of left buttock, stage 1: Secondary | ICD-10-CM | POA: Diagnosis not present

## 2023-06-10 DIAGNOSIS — I129 Hypertensive chronic kidney disease with stage 1 through stage 4 chronic kidney disease, or unspecified chronic kidney disease: Secondary | ICD-10-CM | POA: Diagnosis not present

## 2023-06-10 DIAGNOSIS — G309 Alzheimer's disease, unspecified: Secondary | ICD-10-CM | POA: Diagnosis not present

## 2023-06-10 DIAGNOSIS — E559 Vitamin D deficiency, unspecified: Secondary | ICD-10-CM | POA: Diagnosis not present

## 2023-06-10 DIAGNOSIS — F03918 Unspecified dementia, unspecified severity, with other behavioral disturbance: Secondary | ICD-10-CM | POA: Diagnosis not present

## 2023-07-06 DIAGNOSIS — E039 Hypothyroidism, unspecified: Secondary | ICD-10-CM | POA: Diagnosis not present

## 2023-07-06 DIAGNOSIS — J309 Allergic rhinitis, unspecified: Secondary | ICD-10-CM | POA: Diagnosis not present

## 2023-07-06 DIAGNOSIS — Z515 Encounter for palliative care: Secondary | ICD-10-CM | POA: Diagnosis not present

## 2023-07-06 DIAGNOSIS — R7303 Prediabetes: Secondary | ICD-10-CM | POA: Diagnosis not present

## 2023-07-06 DIAGNOSIS — I10 Essential (primary) hypertension: Secondary | ICD-10-CM | POA: Diagnosis not present
